# Patient Record
Sex: Female | Born: 1940 | Race: Black or African American | Hispanic: No | Marital: Married | Smoking: Never smoker
Health system: Southern US, Community
[De-identification: ages and names within clinical notes are randomized; demographics above are authoritative.]

## PROBLEM LIST (undated history)

## (undated) DIAGNOSIS — I10 Essential (primary) hypertension: Secondary | ICD-10-CM

## (undated) DIAGNOSIS — C801 Malignant (primary) neoplasm, unspecified: Secondary | ICD-10-CM

## (undated) DIAGNOSIS — R05 Cough: Secondary | ICD-10-CM

## (undated) DIAGNOSIS — E785 Hyperlipidemia, unspecified: Secondary | ICD-10-CM

## (undated) DIAGNOSIS — R0602 Shortness of breath: Secondary | ICD-10-CM

## (undated) DIAGNOSIS — R059 Cough, unspecified: Secondary | ICD-10-CM

## (undated) DIAGNOSIS — M8580 Other specified disorders of bone density and structure, unspecified site: Secondary | ICD-10-CM

## (undated) DIAGNOSIS — T7840XA Allergy, unspecified, initial encounter: Secondary | ICD-10-CM

## (undated) DIAGNOSIS — C50919 Malignant neoplasm of unspecified site of unspecified female breast: Secondary | ICD-10-CM

## (undated) HISTORY — DX: Essential (primary) hypertension: I10

## (undated) HISTORY — DX: Other specified disorders of bone density and structure, unspecified site: M85.80

## (undated) HISTORY — PX: BREAST SURGERY: SHX581

## (undated) HISTORY — PX: OTHER SURGICAL HISTORY: SHX169

## (undated) HISTORY — DX: Cough, unspecified: R05.9

## (undated) HISTORY — DX: Allergy, unspecified, initial encounter: T78.40XA

## (undated) HISTORY — DX: Cough: R05

## (undated) HISTORY — DX: Malignant (primary) neoplasm, unspecified: C80.1

## (undated) HISTORY — DX: Hyperlipidemia, unspecified: E78.5

## (undated) HISTORY — DX: Malignant neoplasm of unspecified site of unspecified female breast: C50.919

---

## 1987-10-28 DIAGNOSIS — C50919 Malignant neoplasm of unspecified site of unspecified female breast: Secondary | ICD-10-CM

## 1987-10-28 HISTORY — PX: MASTECTOMY: SHX3

## 1987-10-28 HISTORY — DX: Malignant neoplasm of unspecified site of unspecified female breast: C50.919

## 1988-07-28 HISTORY — PX: OTHER SURGICAL HISTORY: SHX169

## 1998-04-28 ENCOUNTER — Ambulatory Visit (HOSPITAL_COMMUNITY): Admission: RE | Admit: 1998-04-28 | Discharge: 1998-04-28 | Payer: Self-pay | Admitting: Specialist

## 2000-01-06 ENCOUNTER — Encounter: Payer: Self-pay | Admitting: Hematology and Oncology

## 2000-01-06 ENCOUNTER — Encounter: Admission: RE | Admit: 2000-01-06 | Discharge: 2000-01-06 | Payer: Self-pay | Admitting: Hematology and Oncology

## 2000-02-05 ENCOUNTER — Other Ambulatory Visit: Admission: RE | Admit: 2000-02-05 | Discharge: 2000-02-05 | Payer: Self-pay | Admitting: *Deleted

## 2000-04-22 ENCOUNTER — Encounter: Payer: Self-pay | Admitting: Gastroenterology

## 2000-04-22 ENCOUNTER — Ambulatory Visit (HOSPITAL_COMMUNITY): Admission: RE | Admit: 2000-04-22 | Discharge: 2000-04-22 | Payer: Self-pay | Admitting: Gastroenterology

## 2000-10-27 HISTORY — PX: OTHER SURGICAL HISTORY: SHX169

## 2001-01-06 ENCOUNTER — Encounter (HOSPITAL_BASED_OUTPATIENT_CLINIC_OR_DEPARTMENT_OTHER): Payer: Self-pay | Admitting: General Surgery

## 2001-01-06 ENCOUNTER — Encounter: Admission: RE | Admit: 2001-01-06 | Discharge: 2001-01-06 | Payer: Self-pay | Admitting: General Surgery

## 2001-09-27 LAB — HM PAP SMEAR: HM Pap smear: NEGATIVE

## 2002-01-20 ENCOUNTER — Encounter (HOSPITAL_BASED_OUTPATIENT_CLINIC_OR_DEPARTMENT_OTHER): Payer: Self-pay | Admitting: General Surgery

## 2002-01-20 ENCOUNTER — Encounter: Admission: RE | Admit: 2002-01-20 | Discharge: 2002-01-20 | Payer: Self-pay | Admitting: General Surgery

## 2003-01-23 ENCOUNTER — Encounter (HOSPITAL_BASED_OUTPATIENT_CLINIC_OR_DEPARTMENT_OTHER): Payer: Self-pay | Admitting: General Surgery

## 2003-01-23 ENCOUNTER — Encounter: Admission: RE | Admit: 2003-01-23 | Discharge: 2003-01-23 | Payer: Self-pay | Admitting: General Surgery

## 2004-01-25 ENCOUNTER — Encounter: Admission: RE | Admit: 2004-01-25 | Discharge: 2004-01-25 | Payer: Self-pay | Admitting: General Surgery

## 2004-04-18 ENCOUNTER — Ambulatory Visit (HOSPITAL_COMMUNITY): Admission: RE | Admit: 2004-04-18 | Discharge: 2004-04-18 | Payer: Self-pay | Admitting: General Surgery

## 2004-04-18 ENCOUNTER — Encounter (INDEPENDENT_AMBULATORY_CARE_PROVIDER_SITE_OTHER): Payer: Self-pay | Admitting: Specialist

## 2005-02-10 ENCOUNTER — Encounter: Admission: RE | Admit: 2005-02-10 | Discharge: 2005-02-10 | Payer: Self-pay | Admitting: General Surgery

## 2006-02-13 ENCOUNTER — Encounter: Admission: RE | Admit: 2006-02-13 | Discharge: 2006-02-13 | Payer: Self-pay | Admitting: General Surgery

## 2007-02-23 ENCOUNTER — Encounter: Admission: RE | Admit: 2007-02-23 | Discharge: 2007-02-23 | Payer: Self-pay | Admitting: General Surgery

## 2007-04-20 ENCOUNTER — Ambulatory Visit: Payer: Self-pay | Admitting: Oncology

## 2007-07-07 ENCOUNTER — Ambulatory Visit: Payer: Self-pay | Admitting: Oncology

## 2008-03-02 ENCOUNTER — Encounter: Admission: RE | Admit: 2008-03-02 | Discharge: 2008-03-02 | Payer: Self-pay | Admitting: General Surgery

## 2009-03-13 ENCOUNTER — Encounter: Admission: RE | Admit: 2009-03-13 | Discharge: 2009-03-13 | Payer: Self-pay | Admitting: General Surgery

## 2009-03-19 ENCOUNTER — Encounter: Admission: RE | Admit: 2009-03-19 | Discharge: 2009-03-19 | Payer: Self-pay | Admitting: General Surgery

## 2009-08-05 HISTORY — PX: OTHER SURGICAL HISTORY: SHX169

## 2009-08-27 ENCOUNTER — Encounter: Admission: RE | Admit: 2009-08-27 | Discharge: 2009-08-27 | Payer: Self-pay | Admitting: General Surgery

## 2009-10-03 ENCOUNTER — Ambulatory Visit: Payer: Self-pay | Admitting: Family Medicine

## 2010-01-01 ENCOUNTER — Ambulatory Visit: Payer: Self-pay | Admitting: Family Medicine

## 2010-02-21 ENCOUNTER — Encounter: Admission: RE | Admit: 2010-02-21 | Discharge: 2010-02-21 | Payer: Self-pay | Admitting: Orthopedic Surgery

## 2010-03-15 ENCOUNTER — Encounter: Admission: RE | Admit: 2010-03-15 | Discharge: 2010-03-15 | Payer: Self-pay | Admitting: Obstetrics and Gynecology

## 2010-05-02 ENCOUNTER — Ambulatory Visit: Payer: Self-pay | Admitting: Family Medicine

## 2010-06-13 ENCOUNTER — Ambulatory Visit: Payer: Self-pay | Admitting: Family Medicine

## 2010-08-17 ENCOUNTER — Encounter: Admission: RE | Admit: 2010-08-17 | Discharge: 2010-08-17 | Payer: Self-pay | Admitting: Orthopedic Surgery

## 2010-09-03 ENCOUNTER — Ambulatory Visit: Payer: Self-pay | Admitting: Physician Assistant

## 2010-09-05 ENCOUNTER — Ambulatory Visit: Payer: Self-pay | Admitting: Family Medicine

## 2010-11-17 ENCOUNTER — Encounter: Payer: Self-pay | Admitting: Family Medicine

## 2011-01-31 LAB — HM PAP SMEAR: HM Pap smear: NORMAL

## 2011-02-19 ENCOUNTER — Encounter (INDEPENDENT_AMBULATORY_CARE_PROVIDER_SITE_OTHER): Payer: Self-pay | Admitting: General Surgery

## 2011-02-21 ENCOUNTER — Other Ambulatory Visit: Payer: Self-pay | Admitting: General Surgery

## 2011-02-21 DIAGNOSIS — Z853 Personal history of malignant neoplasm of breast: Secondary | ICD-10-CM

## 2011-03-14 NOTE — Op Note (Signed)
NAME:  NEERA, TENG                            ACCOUNT NO.:  [id number]   MEDICAL RECORD NO.:  [id number]                   PATIENT TYPE:  OIB   LOCATION:  2875                                 FACILITY:  [facility]   PHYSICIAN:  Leonie Man, M.D.                DATE OF BIRTH:  [birth year]   DATE OF PROCEDURE:  04/18/2004  DATE OF DISCHARGE:                                 OPERATIVE REPORT   PREOPERATIVE DIAGNOSIS:  Recurrent lipoma of back.   POSTOPERATIVE DIAGNOSIS:  Recurrent lipoma of back.   OPERATION PERFORMED:  Re-excision of recurrent lipoma of back.   SURGEON:  Leonie Man, M.D.   ASSISTANT:  Nurse.   ANESTHESIA:  General.   INDICATIONS FOR PROCEDURE:  Ms. Zo Loudon is a 70 year old woman who is  status post excision of a large lipoma from back several years ago by Dr.  Aundria Rud.  The lipoma has been growing again over the past several months and  has gotten now to the size approximately 15 x 10 cm in size.  Because of the  patient's previous history of breast cancer, she is somewhat concerned about  it's being a recurrence despite our reassurances to the contrary.  She comes  to the operating room now for excision of this mass.   DESCRIPTION OF PROCEDURE:  Following the induction of satisfactory general  anesthesia, the patient was positioned in the prone position on the  operating room table and the region of the mass was prepped and draped to be  included in the sterile operative field.  The mass is soft and somewhat  mobile lesion overlying the upper thoracic spine extending somewhat  laterally to the left.  I infiltrated the region with 0.5% Marcaine with  epinephrine.  I used the old incision, deepened this through the skin and  subcutaneous tissue carrying it down to the region of the lipoma.  There was  some scarring around the region of the capsule making it somewhat more  difficult to find the planes of the lipoma.  This, however, was accomplished  and the  lipoma was dissected free on all sides, carried down to the fascia  of the trapezius muscles and dissected off in its entirety and removed and  forwarded for pathologic evaluation.  Hemostasis was obtained with  electrocautery.  Sponge, instrument and sharp counts were then verified.  Skin closure was two layers using interrupted 3-0 Vicryl sutures catching a  portion of the trapezius fascia so as to eliminate as much dead space as  possible and the skin was then closed with a running 4-0 Monocryl suture and  then reinforced with Steri-Strips.  Sterile dressings were applied.  Anesthetic was reversed and the patient removed from the operating room to  the recovery room in stable condition.  She tolerated the procedure well.  Leonie Man, M.D.    PB/MEDQ  D:  04/18/2004  T:  04/19/2004  Job:  [id number]

## 2011-03-14 NOTE — Procedures (Signed)
[facility]  Patient:    Sarah Howell, Sarah Howell                           MRN: [id number] Proc. Date: 04/22/00 Attending:  Verlin Grills, M.D.                           Procedure Report  PROCEDURE:  Flexible proctocolonoscopy to the hepatic flexure.  PROCEDURE INDICATION:  Ms. Sarah Howell is a 70 year old female who has undergone a left mastectomy to treat carcinoma of the breast.  Ms. Sarah Howell mother was diagnosed with colon cancer when her mother was in her 60s.  There are no other immediate family members with colon cancer.  Ms. Sarah Howell underwent a normal air contrast barium enema May 02, 1993.  Ms. Sarah Howell viewed our colonoscopy education film and I discussed with her the complications associated with colonoscopy and polypectomy, including a 15-per-thousand risk of bleeding and four-per-thousand risk of intestinal perforation.  Ms. Sarah Howell has signed the operative permit.  CURRENT MEDICATIONS:  Aleve.  PAST MEDICAL HISTORY:  Left mastectomy for breast cancer, lipoma removed from her back, tubal ligation, cesarean section.  MEDICATION ALLERGIES:  None.  ENDOSCOPIST:  Verlin Grills, M.D.  PREMEDICATION:  Demerol 50 mg, Versed 7 mg.  ENDOSCOPE:  Olympus pediatric colonoscope.  DESCRIPTION OF PROCEDURE:  After obtaining informed consent, the patient was placed in the left lateral decubitus position.  I administered intravenous Demerol and intravenous Versed to achieve conscious sedation for the procedure.  The patients blood pressure, oxygen saturation and cardiac rhythm were monitored throughout the procedure and documented in the medical record.  Anal inspection was normal.  Digital rectal exam was normal.  The Olympus pediatric video colonoscope was introduced into the rectum and under direct vision, advanced to the hepatic flexure.  Due to colonic loop formation, I was unable to examine the ascending colon, cecum or ileocecal valve.   Colonic preparation for the exam today was excellent.  Rectum normal.  Sigmoid colon and descending colon normal.  Splenic flexure normal.  Transverse colon normal.  Hepatic flexure normal.  Ascending colon, cecum and ileocecal valve were not examined.  ASSESSMENT:  Normal proctocolonoscopy to the hepatic flexure.  RECOMMENDATIONS:  Air contrast barium enema at [facility] to follow. DD:  04/22/00 TD:  04/23/00 Job: [id number]

## 2011-03-17 ENCOUNTER — Ambulatory Visit
Admission: RE | Admit: 2011-03-17 | Discharge: 2011-03-17 | Disposition: A | Discharge status: Home / Self Care | Payer: Medicare Other | Source: Ambulatory Visit | Attending: General Surgery | Admitting: General Surgery

## 2011-03-17 DIAGNOSIS — Z853 Personal history of malignant neoplasm of breast: Secondary | ICD-10-CM

## 2011-04-14 ENCOUNTER — Other Ambulatory Visit: Payer: Self-pay

## 2011-04-14 MED ORDER — PRAVASTATIN SODIUM 40 MG PO TABS: 40.0000 mg | ORAL_TABLET | Freq: Every day | ORAL | Status: DC

## 2011-04-23 ENCOUNTER — Encounter: Payer: Self-pay | Admitting: Family Medicine

## 2011-05-09 ENCOUNTER — Ambulatory Visit (INDEPENDENT_AMBULATORY_CARE_PROVIDER_SITE_OTHER): Payer: Medicare Other | Admitting: Family Medicine

## 2011-05-09 ENCOUNTER — Encounter: Payer: Self-pay | Admitting: Family Medicine

## 2011-05-09 VITALS — BP 138/80 | HR 72 | Temp 98.6°F | Ht 60.0 in | Wt 201.0 lb

## 2011-05-09 DIAGNOSIS — J40 Bronchitis, not specified as acute or chronic: Secondary | ICD-10-CM

## 2011-05-09 DIAGNOSIS — J329 Chronic sinusitis, unspecified: Secondary | ICD-10-CM

## 2011-05-09 MED ORDER — AMOXICILLIN 875 MG PO TABS: 875.0000 mg | ORAL_TABLET | Freq: Two times a day (BID) | ORAL | Status: AC

## 2011-05-09 NOTE — Progress Notes (Signed)
  Subjective:    Patient ID: KAM KUSHNIR, female    DOB: 1941-04-28, 70 y.o.   MRN: [id number]  HPI Tuesday evening she started having difficulty with chills, nasal congestion and coughing will by fatigue. She also had some vomiting and diarrhea. She has no allergies and does not smoke. It was difficult to get a good history from her.  Review of Systems     Objective:   Physical Exam alert and in no distress. Tympanic membranes and canals are normal. Throat is clear. Tonsils are normal. Neck is supple without adenopathy or thyromegaly. Cardiac exam shows a regular sinus rhythm without murmurs or gallops. Lungs are clear to auscultation. Nasal mucosa shows slightly red and swollen tissue was tenderness over all of her sinuses.       Assessment & Plan:  Sinusitis. Bronchitis I will treat with Amoxil. She is to call if not entirely better.

## 2011-05-09 NOTE — Patient Instructions (Signed)
Take all the antibiotic and if you're not totally back to normal call me

## 2011-05-22 ENCOUNTER — Encounter: Payer: BC Managed Care – PPO | Admitting: Family Medicine

## 2011-05-22 ENCOUNTER — Encounter: Payer: Self-pay | Admitting: Family Medicine

## 2011-05-26 ENCOUNTER — Encounter: Payer: Self-pay | Admitting: Family Medicine

## 2011-05-26 ENCOUNTER — Ambulatory Visit (INDEPENDENT_AMBULATORY_CARE_PROVIDER_SITE_OTHER): Payer: Medicare Other | Admitting: Family Medicine

## 2011-05-26 VITALS — BP 112/80 | HR 80 | Wt 198.0 lb

## 2011-05-26 DIAGNOSIS — E785 Hyperlipidemia, unspecified: Secondary | ICD-10-CM

## 2011-05-26 DIAGNOSIS — J019 Acute sinusitis, unspecified: Secondary | ICD-10-CM

## 2011-05-26 DIAGNOSIS — Z79899 Other long term (current) drug therapy: Secondary | ICD-10-CM

## 2011-05-26 DIAGNOSIS — I10 Essential (primary) hypertension: Secondary | ICD-10-CM

## 2011-05-26 LAB — COMPREHENSIVE METABOLIC PANEL
ALT: 11 U/L (ref 0–35)
AST: 23 U/L (ref 0–37)
Albumin: 4 g/dL (ref 3.5–5.2)
BUN: 14 mg/dL (ref 6–23)
CO2: 23 mEq/L (ref 19–32)
Calcium: 9.8 mg/dL (ref 8.4–10.5)
Potassium: 4.3 mEq/L (ref 3.5–5.3)

## 2011-05-26 LAB — LIPID PANEL
Cholesterol: 184 mg/dL (ref 0–200)
HDL: 47 mg/dL (ref >39)

## 2011-05-26 MED ORDER — AMOXICILLIN 875 MG PO TABS: 875.0000 mg | ORAL_TABLET | Freq: Two times a day (BID) | ORAL | Status: AC

## 2011-05-26 MED ORDER — LOSARTAN POTASSIUM 25 MG PO TABS: 25.0000 mg | ORAL_TABLET | Freq: Every day | ORAL | Status: DC

## 2011-05-26 MED ORDER — PRAVASTATIN SODIUM 40 MG PO TABS: 40.0000 mg | ORAL_TABLET | Freq: Every day | ORAL | Status: DC

## 2011-05-26 NOTE — Progress Notes (Signed)
  Subjective:    Patient ID: Sarah Howell, female    DOB: 05-Mar-1941, 70 y.o.   MRN: [id number]  HPI She is here for recheck. She continues on medications listed in the chart. She presently is on Pravachol 80 mg. She continues to have a slight cough but is roughly 90% better than what her last visit. She needs refills on her medications. She has no other concerns or complaints.   Review of Systems     Objective:   Physical Exam alert and in no distress. Tympanic membranes and canals are normal. Throat is clear. Tonsils are normal. Neck is supple without adenopathy or thyromegaly. Cardiac exam shows a regular sinus rhythm without murmurs or gallops. Lungs are clear to auscultation. Nasal mucosa is normal. Nontender sinuses.        Assessment & Plan:  Hypertension. Hyperlipidemia. Unresolved sinusitis. Her medications were renewed and I will give her another 10 days of Amoxil. She will call and let me know how she is doing.

## 2011-05-26 NOTE — Patient Instructions (Signed)
Take the antibiotic and call me at the end of 10 days if you're still having difficulty.

## 2011-05-30 ENCOUNTER — Ambulatory Visit (INDEPENDENT_AMBULATORY_CARE_PROVIDER_SITE_OTHER): Payer: Medicare Other | Admitting: General Surgery

## 2011-05-30 ENCOUNTER — Encounter (INDEPENDENT_AMBULATORY_CARE_PROVIDER_SITE_OTHER): Payer: Self-pay | Admitting: General Surgery

## 2011-05-30 VITALS — HR 72 | Temp 96.0°F

## 2011-05-30 DIAGNOSIS — C50912 Malignant neoplasm of unspecified site of left female breast: Secondary | ICD-10-CM | POA: Insufficient documentation

## 2011-05-30 DIAGNOSIS — Z853 Personal history of malignant neoplasm of breast: Secondary | ICD-10-CM

## 2011-05-30 NOTE — Progress Notes (Signed)
Sarah Howell is a 70 y.o. female.    Chief Complaint  Patient presents with  . Other    f/u br reck    HPI HPI Pt is doing well.  She has not felt any masses in her R breast or on the left in the reconstructed area.  She has had her colonoscopy up to date.  She denies nipple discharge or skin changes.    Past Medical History  Diagnosis Date  . Cancer     BREAST  . Hypertension   . Alopecia   . Osteopenia   . Hyperlipidemia   . Cough   . Allergy   . Glaucoma     bilateral    Past Surgical History  Procedure Date  . Lt breast ca reconstruction 07/28/1988  . Orthoscopic surgery lt knee 2002  . Orthoscopic surgery rt knee 08/05/2009  . Rotoator cuff cleaning   . Joint replacement   . Breast surgery     lft br mastectomy and reconstruction    Family History  Problem Relation Age of Onset  . Cancer Mother     colon  . Cancer Father     prostate  . Cancer Brother     pancreatic    Social History History  Substance Use Topics  . Smoking status: Former Games developer  . Smokeless tobacco: Never Used  . Alcohol Use: No    No Known Allergies  Current Outpatient Prescriptions  Medication Sig Dispense Refill  . amoxicillin (AMOXIL) 875 MG tablet Take 1 tablet (875 mg total) by mouth 2 (two) times daily.  20 tablet  0  . Brimonidine Tartrate-Timolol (COMBIGAN OP) Apply to eye. 1 drop in each eye in morning        . Calcium Carbonate-Vitamin D (CALCIUM + D PO) Take by mouth 2 (two) times daily.        . finasteride (PROSCAR) 5 MG tablet Take 5 mg by mouth daily. 1/2 tab daily       . losartan (COZAAR) 25 MG tablet Take 1 tablet (25 mg total) by mouth daily.  90 tablet  4  . pravastatin (PRAVACHOL) 40 MG tablet Take 80 mg by mouth daily. @@bedtime          Review of Systems Review of Systems  HENT: Positive for congestion.   All other systems reviewed and are negative.    Physical Exam Physical Exam  Constitutional: She is oriented to person, place, and time. She  appears well-developed and well-nourished. No distress.  HENT:  Head: Normocephalic and atraumatic.  Mouth/Throat: Oropharynx is clear and moist. No oropharyngeal exudate.  Eyes: Conjunctivae are normal. Pupils are equal, round, and reactive to light. No scleral icterus.  Neck: Normal range of motion. Neck supple. No tracheal deviation present. No thyromegaly present.  Cardiovascular: Normal rate, regular rhythm and intact distal pulses.   Respiratory: Effort normal. No respiratory distress. She exhibits no tenderness. Right breast exhibits no inverted nipple, no mass, no nipple discharge, no skin change and no tenderness. Left breast exhibits no inverted nipple, no mass, no nipple discharge, no skin change and no tenderness.       L breast reconstructed, no masses or tenderness R breast with biopsy scar  GI: Soft. She exhibits no distension and no mass. There is no tenderness. There is no rebound and no guarding.  Musculoskeletal: Normal range of motion. She exhibits no edema and no tenderness.  Lymphadenopathy:    She has no cervical adenopathy.  Neurological: She  is alert and oriented to person, place, and time. Coordination normal.  Skin: Skin is warm and dry. No rash noted. She is not diaphoretic. No erythema. No pallor.  Psychiatric: She has a normal mood and affect. Her behavior is normal. Judgment and thought content normal.     Pulse 72, temperature 96 F (35.6 C).  Assessment/Plan History of breast cancer in female, s/p L MRM with reconstruction 1989 w [facility] No abnormal findings of physical exam. R mammogram 02/2011 OK. Follow up in 1 year.      Siriah Treat 05/30/2011, 1:31 PM

## 2011-05-30 NOTE — Assessment & Plan Note (Signed)
No abnormal findings of physical exam. R mammogram 02/2011 OK. Follow up in 1 year.

## 2011-07-29 ENCOUNTER — Telehealth: Payer: Self-pay | Admitting: Family Medicine

## 2011-07-29 MED ORDER — PRAVASTATIN SODIUM 80 MG PO TABS: 80.0000 mg | ORAL_TABLET | Freq: Every evening | ORAL | Status: DC

## 2011-07-29 NOTE — Telephone Encounter (Signed)
Sent in request with 4 refill

## 2011-07-29 NOTE — Telephone Encounter (Signed)
Sent in med

## 2011-10-07 ENCOUNTER — Ambulatory Visit (INDEPENDENT_AMBULATORY_CARE_PROVIDER_SITE_OTHER): Payer: Medicare Other | Admitting: Family Medicine

## 2011-10-07 ENCOUNTER — Encounter: Payer: Self-pay | Admitting: Family Medicine

## 2011-10-07 VITALS — BP 128/80 | HR 73 | Wt 199.0 lb

## 2011-10-07 DIAGNOSIS — Z79899 Other long term (current) drug therapy: Secondary | ICD-10-CM

## 2011-10-07 DIAGNOSIS — M129 Arthropathy, unspecified: Secondary | ICD-10-CM

## 2011-10-07 DIAGNOSIS — J3089 Other allergic rhinitis: Secondary | ICD-10-CM

## 2011-10-07 DIAGNOSIS — M1990 Unspecified osteoarthritis, unspecified site: Secondary | ICD-10-CM

## 2011-10-07 DIAGNOSIS — I10 Essential (primary) hypertension: Secondary | ICD-10-CM

## 2011-10-07 MED ORDER — FEXOFENADINE HCL 180 MG PO TABS: 180.0000 mg | ORAL_TABLET | Freq: Every day | ORAL | Status: DC

## 2011-10-07 NOTE — Patient Instructions (Signed)
Go to [facility] to get your shingles vaccine. Use Tylenol for the knee aches and pains. Continue on Allegra for your allergy symptoms.

## 2011-10-07 NOTE — Progress Notes (Signed)
  Subjective:    Patient ID: Sarah Howell, female    DOB: 1941/02/23, 70 y.o.   MRN: [id number]  HPI She is here for consult. She recently got a ticket for using a handicapped parking area. Her husband has a sticker however it was not in the car. She is interested in getting a sticker however further consultation with her indicates she does not have a the criteria necessary. She does have difficulty with intermittent knee pain however has not taken any medications for this. She also has underlying allergies and continues on her blood pressure medications. He would like a refill on her blood pressure medicines and an antihistamine for her allergies.   Review of Systems     Objective:   Physical Exam Alert and in no distress otherwise not examined.       Assessment & Plan:   1. Allergic rhinitis due to other allergen   2. Hypertension   3. Arthritis   4. Encounter for long-term (current) use of other medications    I will give her Allegra. Recommend Tylenol for her arthritis symptoms. Continue on her blood pressure meds. I also wrote her prescription to get shingles vaccine.

## 2011-10-28 HISTORY — PX: EYE SURGERY: SHX253

## 2011-10-31 ENCOUNTER — Ambulatory Visit (INDEPENDENT_AMBULATORY_CARE_PROVIDER_SITE_OTHER): Payer: Medicare Other | Admitting: Family Medicine

## 2011-10-31 ENCOUNTER — Encounter: Payer: Self-pay | Admitting: Family Medicine

## 2011-10-31 VITALS — BP 128/88 | HR 64 | Ht 59.0 in | Wt 198.0 lb

## 2011-10-31 DIAGNOSIS — K219 Gastro-esophageal reflux disease without esophagitis: Secondary | ICD-10-CM

## 2011-10-31 NOTE — Patient Instructions (Signed)
Take 2 Prilosec per day and call me in one week

## 2011-10-31 NOTE — Progress Notes (Signed)
  Subjective:    Patient ID: Sarah Howell, female    DOB: 07-23-1941, 71 y.o.   MRN: [id number]  HPI She complains of a five-day history of coughing with some postnasal drainage. She did have one episode of vomiting. She notes that she coughs with eating and sometimes when she lies down. She continues on Allegra for her allergy-type symptoms. She continues on medications listed in the chart.   Review of Systems     Objective:   Physical Exam alert and in no distress. Tympanic membranes and canals are normal. Throat is clear. Tonsils are normal. Neck is supple without adenopathy or thyromegaly. Cardiac exam shows a regular sinus rhythm without murmurs or gallops. Lungs are clear to auscultation.        Assessment & Plan:  Possible GERD. Recommend she try Prilosec 2 pills per day and call me in one week.

## 2011-11-18 ENCOUNTER — Ambulatory Visit (INDEPENDENT_AMBULATORY_CARE_PROVIDER_SITE_OTHER): Payer: Medicare Other | Admitting: Family Medicine

## 2011-11-18 VITALS — BP 128/80 | HR 80 | Ht 59.0 in | Wt 199.0 lb

## 2011-11-18 DIAGNOSIS — I10 Essential (primary) hypertension: Secondary | ICD-10-CM

## 2011-11-18 DIAGNOSIS — H269 Unspecified cataract: Secondary | ICD-10-CM

## 2011-11-18 DIAGNOSIS — L659 Nonscarring hair loss, unspecified: Secondary | ICD-10-CM

## 2011-11-18 NOTE — Progress Notes (Signed)
  Subjective:    Patient ID: Sarah Howell, female    DOB: [birth year], 71 y.o.   MRN: [id number]  HPI She is here for a preoperative evaluation prior to cataract surgery. She has had no chest pain, shortness of breath, pulmonary symptoms. She does have underlying hypertension as well as alopecia. She sees a Armed forces operational officer in [place] for her alopecia and presently is on finasteride. She also is on Cozaar for her hypertension and use of Pravachol for her cholesterol.   Review of Systems     Objective:   Physical Exam alert and in no distress. Tympanic membranes and canals are normal. Throat is clear. Tonsils are normal. Neck is supple without adenopathy or thyromegaly. Cardiac exam shows a regular sinus rhythm without murmurs or gallops. Lungs are clear to auscultation. EKG shows no acute changes.       Assessment & Plan:   1. Cataracts, bilateral   2. Alopecia   3. Hypertension    She is cleared for surgery.

## 2011-11-19 ENCOUNTER — Encounter: Payer: Self-pay | Admitting: Family Medicine

## 2011-12-01 ENCOUNTER — Encounter (HOSPITAL_COMMUNITY): Payer: Self-pay | Admitting: Pharmacy Technician

## 2011-12-03 ENCOUNTER — Other Ambulatory Visit: Payer: Self-pay | Admitting: Family Medicine

## 2011-12-03 ENCOUNTER — Other Ambulatory Visit: Payer: Self-pay | Admitting: Ophthalmology

## 2011-12-03 NOTE — H&P (Signed)
Chief Complaint:  POAG Patient,Blurry vision worse od interfering with daily activity.    Discontinued Combigan and is now using Cosopt PF (much better)   Subjective:  HISTORY OF PRESENT ILLNESS:  Onset: Years   Duration:  Years   Quality: Same     Location:  Both   Intensity / Severity: Moderate   Context / When:  Been Treated For Before    Mental Status: Oriented to Time, Person, Place   REVIEW OF SYSTEMS:   GEN: Denies weight changes, appetite changes, unusual weakness, bleeding, fever, chills, recent trauma or infections  HEENT: Denies vision changes, hearing changes, epistaxis, unusual sneezing, sore throat, swallowing difficulties, ear pain, or facial pain.  NECK: Denies neck pain swelling or stiffness.  LUNGS: Denies cough, dyspnea, orthopnea, or hemoptosis.  HEART: Confirms HIGH BLOOD PRESSURE.  ABD: Denies abdomen pain, eructation, nausea, vomiting, hematemesis, diarrhea, constipation, hematochezia, melena, acholic stools, or flatulence.  GENT: Denies recent dysruia, urine frequency, urine hesitancy, urine urgency, urine flow-slow, urine retention, nocturia, polyuria, dark urine, or incontinence.  BJE: Denies arthralgia, joint stiffness, back pain, muscle cramps, or myalgia.  SKIN: Denies rashes, lesions, anhidrosis, bruising, or pruritus.  NEURO: Denies memory loss, disorientation, syncope, diplopia, dizziness, vertigo, clumsiness, paresthesias or cephalgia.    OBJECTIVE:  EYE EXAM:   VA:  OD Excel 20/40 Last visit 20/50 OS Fisher 20/40 (last visit 20/50  OU [place] 20/25  Glasses: OD XOTC Readers  AutoRefraction: OD: +1.00 + 0.75 x 030 OS: +1.00 + 0.25 x 089  Ks: OD: 44.50  45.50 OS: 44.50  46.50  Refraction 12.14.2012 OD: +1.25 + 0.75 x 020.....20/40 OS: +1.00 + 0.50 x 090.....20/30 Add: 2.50   CCT<  VF:  OD computerized visual field test normal OS slightly worse  Ocular Adnexa: NORMAL WITH NO LYMPHADENOPHTHY  Motility: ortho with  full versions  SLE Conjunctiva: pinguculae plus one to two  hyperemia OU  + 1 injection OU Pupils: 4 mm reactive  Iris: Brown   Cornea: arcus plus two to three staining OU  Anterior Chamber: deep and quiet  Lens: plus two- three nuclear sclerosis od/os plus two nuclear sclersis  Vitreous:  TARGET Ta<  IOP: 11 and 11 mm of mercury right and left eye    Time:  11/13/2011 11:22   Gonio:  Dilation:Phenylephrine 2.5%,Tropicamide  Optic discs: OD 85% CUP inferior rim flat & discolored                   OS 85% cup temporal pallor  Retina & Vessels:   HEALTH AND PHYSICAL  Blood Pressure:140/80  Pulse:75  Respiration:20  GENERAL: No acute distress  HEENT: MM's WNL, TM's normal, PERLA, Conjunctiva WNL, Fundi benign  NECK: Full ROM, no adenopathy or masses, Thyroid WNL  LUNGS: Clear to auscultation, equal BS  HEART: RR&R, no murmur, gallop, or rub, not enlarged  ABD: Soft no tenderness, BS WHNL, No Masses or Organomegaly  GENT: deferred  BJE:  NEURO: CN II through XII intact  SKIN:  STUDIES: A-Scan  ASSESSMENT:  Primary open angle glaucoma   ICD#365.11   optic nerve no change from 2007 photos detected  good IOP control  Keratoconjunctivitis sicca, 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 continue same meds    MEDICATION:  Artificial Tears  Pataday: 0.2% (solution) SIG-  1 gtt in each affected eye once a day for 10 days #1 Bottle(s)  Substitutions Allowed Refills- 3 Notes-    Cosopt PF: 2%-0.5% (solution) SIG-  1 gtt in each eye 2 times a day for 30 days #30 Vial(s)  Substitutions Not Allowed Refills- 5 Notes-    Bromday: 0.09% (solution) SIG-  one drop right eye once a day  #1 Bottle(s)  Substitutions Not Allowed Refills- 3 Notes-    Besivance: 0.6%  (suspension) SIG-  one drop right eye twice a day  #1 Bottle(s)  Substitutions Not Allowed Refills- 2 Notes-    Pred Forte: acetate 1% (suspension) SIG-  0ne drop right eye four times a day  #1 Bottle(s)  Substitutions Allowed Refills- 1 Notes   FOLLOW UP: One Day PostOp

## 2011-12-05 ENCOUNTER — Encounter (HOSPITAL_COMMUNITY): Payer: Self-pay

## 2011-12-05 ENCOUNTER — Encounter (HOSPITAL_COMMUNITY)
Admission: RE | Admit: 2011-12-05 | Discharge: 2011-12-05 | Disposition: A | Discharge status: Home / Self Care | Payer: Medicare Other | Source: Ambulatory Visit | Attending: Ophthalmology | Admitting: Ophthalmology

## 2011-12-05 ENCOUNTER — Encounter (HOSPITAL_COMMUNITY)
Admission: RE | Admit: 2011-12-05 | Discharge: 2011-12-05 | Disposition: A | Discharge status: Home / Self Care | Payer: Medicare Other | Source: Ambulatory Visit | Attending: Anesthesiology | Admitting: Anesthesiology

## 2011-12-05 HISTORY — DX: Shortness of breath: R06.02

## 2011-12-05 LAB — CBC
HCT: 39.6 % (ref 36.0–46.0)
Hemoglobin: 12.8 g/dL (ref 12.0–15.0)
MCV: 92.7 fL (ref 78.0–100.0)
Platelets: 218 10*3/uL (ref 150–400)
RBC: 4.27 MIL/uL (ref 3.87–5.11)
WBC: 8 10*3/uL (ref 4.0–10.5)

## 2011-12-05 LAB — BASIC METABOLIC PANEL
CO2: 26 mEq/L (ref 19–32)
Chloride: 104 mEq/L (ref 96–112)
Glucose, Bld: 89 mg/dL (ref 70–99)
Potassium: 4.1 mEq/L (ref 3.5–5.1)
Sodium: 140 mEq/L (ref 135–145)

## 2011-12-05 NOTE — Progress Notes (Signed)
MULTIPLE CALLS MADE TO DR [facility] OFFICE TO CLARIFY CONSENT, NO RETURN CALL. MESSAGE LEFT ON VOICEMAIL.  WILL NEED TO SIGN CONSENT DOS.

## 2011-12-05 NOTE — Pre-Procedure Instructions (Signed)
20 PHILENA OBEY  12/05/2011   Your procedure is scheduled on: Wednesday 12/10/11   Report to [facility] at 1030 AM.  Call this number if you have problems the morning of surgery: [phone]   Remember:   Do not eat food:After Midnight.  May have clear liquids: up to 4 Hours before arrival.  Clear liquids include soda, tea, black coffee, apple or grape juice, broth.  Take these medicines the morning of surgery with A SIP OF WATER:     Do not wear jewelry, make-up or nail polish.  Do not wear lotions, powders, or perfumes. You may wear deodorant.  Do not shave 48 hours prior to surgery.  Do not bring valuables to the hospital.  Contacts, dentures or bridgework may not be worn into surgery.  Leave suitcase in the car. After surgery it may be brought to your room.  For patients admitted to the hospital, checkout time is 11:00 AM the day of discharge.   Patients discharged the day of surgery will not be allowed to drive home.  Name and phone number of your driver:   Special Instructions: CHG Shower Use Special Wash: 1/2 bottle night before surgery and 1/2 bottle morning of surgery.   Please read over the following fact sheets that you were given: Pain Booklet, MRSA Information and Surgical Site Infection Prevention

## 2011-12-09 MED ORDER — KETOROLAC TROMETHAMINE 0.5 % OP SOLN
1.0000 [drp] | OPHTHALMIC | Status: AC
  Administered 2011-12-10 (×3): 1 [drp] via OPHTHALMIC
  Filled 2011-12-09: qty 5

## 2011-12-09 MED ORDER — CYCLOPENTOLATE-PHENYLEPHRINE 0.2-1 % OP SOLN
1.0000 [drp] | OPHTHALMIC | Status: AC
  Administered 2011-12-10 (×3): 1 [drp] via OPHTHALMIC
  Filled 2011-12-09: qty 2

## 2011-12-10 ENCOUNTER — Encounter (HOSPITAL_COMMUNITY): Payer: Self-pay | Admitting: *Deleted

## 2011-12-10 ENCOUNTER — Encounter (HOSPITAL_COMMUNITY)
Admission: RE | Disposition: A | Discharge status: Home / Self Care | Payer: Self-pay | Source: Ambulatory Visit | Attending: Ophthalmology

## 2011-12-10 ENCOUNTER — Ambulatory Visit (HOSPITAL_COMMUNITY): Payer: Medicare Other | Admitting: *Deleted

## 2011-12-10 ENCOUNTER — Ambulatory Visit (HOSPITAL_COMMUNITY)
Admission: RE | Admit: 2011-12-10 | Discharge: 2011-12-10 | Disposition: A | Discharge status: Home / Self Care | Payer: Medicare Other | Source: Ambulatory Visit | Attending: Ophthalmology | Admitting: Ophthalmology

## 2011-12-10 DIAGNOSIS — Z01812 Encounter for preprocedural laboratory examination: Secondary | ICD-10-CM | POA: Insufficient documentation

## 2011-12-10 DIAGNOSIS — Z853 Personal history of malignant neoplasm of breast: Secondary | ICD-10-CM

## 2011-12-10 DIAGNOSIS — H2510 Age-related nuclear cataract, unspecified eye: Secondary | ICD-10-CM

## 2011-12-10 DIAGNOSIS — E785 Hyperlipidemia, unspecified: Secondary | ICD-10-CM

## 2011-12-10 DIAGNOSIS — H269 Unspecified cataract: Secondary | ICD-10-CM | POA: Insufficient documentation

## 2011-12-10 DIAGNOSIS — H409 Unspecified glaucoma: Secondary | ICD-10-CM | POA: Insufficient documentation

## 2011-12-10 DIAGNOSIS — H4010X0 Unspecified open-angle glaucoma, stage unspecified: Secondary | ICD-10-CM | POA: Insufficient documentation

## 2011-12-10 DIAGNOSIS — I10 Essential (primary) hypertension: Secondary | ICD-10-CM

## 2011-12-10 DIAGNOSIS — Z01818 Encounter for other preprocedural examination: Secondary | ICD-10-CM | POA: Insufficient documentation

## 2011-12-10 HISTORY — PX: CATARACT EXTRACTION W/PHACO: SHX586

## 2011-12-10 SURGERY — PHACOEMULSIFICATION, CATARACT, WITH IOL INSERTION
Anesthesia: Monitor Anesthesia Care | Site: Eye | Laterality: Right | Wound class: Clean

## 2011-12-10 MED ORDER — SODIUM HYALURONATE 10 MG/ML IO SOLN
INTRAOCULAR | Status: DC | PRN
  Administered 2011-12-10: 0.85 mL via INTRAOCULAR

## 2011-12-10 MED ORDER — LIDOCAINE-EPINEPHRINE 2 %-1:100000 IJ SOLN
INTRAMUSCULAR | Status: DC | PRN
  Administered 2011-12-10: 10 mL

## 2011-12-10 MED ORDER — BSS IO SOLN
INTRAOCULAR | Status: DC | PRN
  Administered 2011-12-10: 500 mL via INTRAOCULAR

## 2011-12-10 MED ORDER — BUPIVACAINE HCL 0.75 % IJ SOLN
INTRAMUSCULAR | Status: DC | PRN
  Administered 2011-12-10: 10 mL

## 2011-12-10 MED ORDER — EPINEPHRINE HCL 1 MG/ML IJ SOLN
INTRAMUSCULAR | Status: DC | PRN
  Administered 2011-12-10: .3 mL

## 2011-12-10 MED ORDER — BSS IO SOLN
INTRAOCULAR | Status: DC | PRN
  Administered 2011-12-10: 15 mL via INTRAOCULAR

## 2011-12-10 MED ORDER — PROPOFOL 10 MG/ML IV BOLUS
INTRAVENOUS | Status: DC | PRN
  Administered 2011-12-10: 30 mg via INTRAVENOUS

## 2011-12-10 MED ORDER — HYALURONIDASE HUMAN 150 UNIT/ML IJ SOLN
INTRAMUSCULAR | Status: DC | PRN
  Administered 2011-12-10: 150 [IU]

## 2011-12-10 MED ORDER — TOBRAMYCIN 0.3 % OP OINT
TOPICAL_OINTMENT | OPHTHALMIC | Status: DC | PRN
  Administered 2011-12-10: 1 via OPHTHALMIC

## 2011-12-10 MED ORDER — NA CHONDROIT SULF-NA HYALURON 40-30 MG/ML IO SOLN
INTRAOCULAR | Status: DC | PRN
  Administered 2011-12-10: 0.5 mL via INTRAOCULAR

## 2011-12-10 MED ORDER — ACETYLCHOLINE CHLORIDE 1:100 IO SOLR
INTRAOCULAR | Status: DC | PRN
  Administered 2011-12-10: 10 mg via INTRAOCULAR

## 2011-12-10 MED ORDER — SODIUM CHLORIDE 0.9 % IV SOLN
INTRAVENOUS | Status: DC | PRN
  Administered 2011-12-10: 13:00:00 via INTRAVENOUS

## 2011-12-10 SURGICAL SUPPLY — 39 items
APL SRG 3 HI ABS STRL LF PLS (MISCELLANEOUS) ×1
APPLICATOR COTTON TIP 6IN STRL (MISCELLANEOUS) ×2 IMPLANT
APPLICATOR DR MATTHEWS STRL (MISCELLANEOUS) ×2 IMPLANT
BLADE KERATOME 2.75 (BLADE) ×2 IMPLANT
BLADE MINI RND TIP GREEN BEAV (BLADE) IMPLANT
BLADE STAB KNIFE 45DEG (BLADE) IMPLANT
CANNULA ANTERIOR CHAMBER 27GA (MISCELLANEOUS) ×2 IMPLANT
CLOTH BEACON ORANGE TIMEOUT ST (SAFETY) ×2 IMPLANT
CORDS BIPOLAR (ELECTRODE) IMPLANT
DRAPE OPHTHALMIC 40X48 W POUCH (DRAPES) ×2 IMPLANT
DRAPE RETRACTOR (MISCELLANEOUS) ×2 IMPLANT
FILTER BLUE MILLIPORE (MISCELLANEOUS) IMPLANT
GLOVE BIO SURGEON STRL SZ8 (GLOVE) ×2 IMPLANT
GLOVE SURG SS PI 6.5 STRL IVOR (GLOVE) ×3 IMPLANT
GOWN STRL NON-REIN LRG LVL3 (GOWN DISPOSABLE) ×5 IMPLANT
KIT ROOM TURNOVER OR (KITS) ×2 IMPLANT
KNIFE CRESCENT 2.5 55 ANG (BLADE) IMPLANT
LENS IOL ACRSF IQ PC 20.0 (Intraocular Lens) IMPLANT
LENS IOL ACRYSOF IQ POST 20.0 (Intraocular Lens) ×2 IMPLANT
MARKER SKIN DUAL TIP RULER LAB (MISCELLANEOUS) ×2 IMPLANT
MASK EYE SHIELD (GAUZE/BANDAGES/DRESSINGS) ×1 IMPLANT
NDL 25GX 5/8IN NON SAFETY (NEEDLE) ×1 IMPLANT
NEEDLE 25GX 5/8IN NON SAFETY (NEEDLE) ×2 IMPLANT
NS IRRIG 1000ML POUR BTL (IV SOLUTION) ×2 IMPLANT
PACK CATARACT CUSTOM (CUSTOM PROCEDURE TRAY) ×2 IMPLANT
PACK CATARACT MCHSCP (PACKS) ×2 IMPLANT
PAD ARMBOARD 7.5X6 YLW CONV (MISCELLANEOUS) ×4 IMPLANT
PAD EYE OVAL STERILE LF (GAUZE/BANDAGES/DRESSINGS) ×1 IMPLANT
PROBE ANTERIOR 20G W/INFUS NDL (MISCELLANEOUS) IMPLANT
SPEAR EYE SURG WECK-CEL (MISCELLANEOUS) IMPLANT
SUT ETHILON 10 0 CS140 6 (SUTURE) ×2 IMPLANT
SUT SILK 4 0 C 3 735G (SUTURE) IMPLANT
SUT SILK 6 0 G 6 (SUTURE) IMPLANT
SUT VICRYL 8 0 TG140 8 (SUTURE) IMPLANT
SYR 3ML LL SCALE MARK (SYRINGE) IMPLANT
TAPE SURG TRANSPORE 1 IN (GAUZE/BANDAGES/DRESSINGS) IMPLANT
TAPE SURGICAL TRANSPORE 1 IN (GAUZE/BANDAGES/DRESSINGS) ×1
TOWEL OR 17X24 6PK STRL BLUE (TOWEL DISPOSABLE) ×4 IMPLANT
WATER STERILE IRR 1000ML POUR (IV SOLUTION) ×2 IMPLANT

## 2011-12-10 NOTE — Progress Notes (Signed)
Pt had iv in right wrist placed by holding rn ... Removed in  Post op ... Tip intact. #20 quick cath ... Site unremarkable.Marland KitchenMarland Kitchen

## 2011-12-10 NOTE — Op Note (Signed)
Preop diagnosis: This significant cataract right eye and controlled the primary open-angle glaucoma Postoperative diagnosis: Same Procedure: Phacoemulsification with intraocular lens implant right eye Anesthesia: 2% Xylocaine with epinephrine at the 50 mixture 0.75% Marcaine with ampule of [place] Surgeon: Ardelle Anton Junior M.D. Procedure: The patient was taken to the operating room where under monitored anesthesia she was given a peribulbar block with the aforementioned local anesthetic agent. Following this the patient's face was prepped and draped in the usual sterile fashion after pressure had been applied to the globe. While positioning the operating microscope it was noted that the scope was not functioning properly the foot pedal did not work foot pedal was removed replaced and still did not work properly at this point it was necessary to remove this microscope bringing another microscope microscope was evaluated and noted to work appropriately the microscope was situated and with the patient's face prepped and draped in the surgeon sitting temporally a Weck-Cel sponge was used to fixate the eye and a 15 blade was used to ensue superior clear cornea. Viscoat was injected in the eye and additional Weck-Cel sponge was used to fixate the eye and a 2.75 mm keratome blade was used in a stepwise fashion to the temporal clear cornea Viscoat was again injected and a bent 25-gauge needle was used to incise the anterior capsule and a continuous tear curvilinear capsulorrhexis was formed. BSS was used to hydrodissect and hydrodelineate the nucleus with a ring noted around the epinucleus following this the phacoemulsification unit was then used to remove the epinucleus and sculpt the nucleus the nucleus was snapped with the Kuglen hook and phaco tip into 4 quadrants and all quadrants were removed a Binkhorst cannula was then used to hydrodissect the epinucleus. The irrigation aspiration device was then used to  remove the epinucleus and cortical fibers from the eye the posterior capsule remained intact therefore Provisc was injected into the eye. The intraocular lens implant which was an Alcon AcrySof SN 60 WF was noted to have no defects power 20.0 diopter lens SN #[id number] was placed the lens injector was injected in the eye and unfolded into position it was positioned with the Kuglen hook. On this diet was used to remove viscoelastic from the eye Miostat was injected the eye was pressurized and a single 10-0 nylon suture was placed the eye was pressurized again and there being no leakage of instrumentation were removed from the eye. Topical TobraDex ointment was applied to the eye a patch and Fox shield were placed and the patient returned to the recovery area in stable condition Complications none Landscape architect M.D.

## 2011-12-10 NOTE — Preoperative (Signed)
Beta Blockers   Reason not to administer Beta Blockers:Not Applicable 

## 2011-12-10 NOTE — Transfer of Care (Signed)
Immediate Anesthesia Transfer of Care Note  Patient: Sarah Howell  Procedure(s) Performed: Procedure(s) (LRB): CATARACT EXTRACTION PHACO AND INTRAOCULAR LENS PLACEMENT (IOC) (Right)  Patient Location: PACU  Anesthesia Type: MAC  Level of Consciousness: awake, alert  and oriented  Airway & Oxygen Therapy: Patient Spontanous Breathing  Post-op Assessment: Report given to PACU RN and Post -op Vital signs reviewed and stable  Post vital signs: Reviewed and stable  Complications: No apparent anesthesia complications

## 2011-12-10 NOTE — H&P (View-Only) (Signed)
 Chief Complaint:  POAG Patient,Blurry vision worse od interfering with daily activity.    Discontinued Combigan and is now using Cosopt PF (much better)   Subjective:  HISTORY OF PRESENT ILLNESS:  Onset: Years   Duration:  Years   Quality: Same     Location:  Both   Intensity / Severity: Moderate   Context / When:  Been Treated For Before    Mental Status: Oriented to Time, Person, Place   REVIEW OF SYSTEMS:   GEN: Denies weight changes, appetite changes, unusual weakness, bleeding, fever, chills, recent trauma or infections  HEENT: Denies vision changes, hearing changes, epistaxis, unusual sneezing, sore throat, swallowing difficulties, ear pain, or facial pain.  NECK: Denies neck pain swelling or stiffness.  LUNGS: Denies cough, dyspnea, orthopnea, or hemoptosis.  HEART: Confirms HIGH BLOOD PRESSURE.  ABD: Denies abdomen pain, eructation, nausea, vomiting, hematemesis, diarrhea, constipation, hematochezia, melena, acholic stools, or flatulence.  GENT: Denies recent dysruia, urine frequency, urine hesitancy, urine urgency, urine flow-slow, urine retention, nocturia, polyuria, dark urine, or incontinence.  BJE: Denies arthralgia, joint stiffness, back pain, muscle cramps, or myalgia.  SKIN: Denies rashes, lesions, anhidrosis, bruising, or pruritus.  NEURO: Denies memory loss, disorientation, syncope, diplopia, dizziness, vertigo, clumsiness, paresthesias or cephalgia.    OBJECTIVE:  EYE EXAM:   VA:  OD Sarah Howell 20/40 Last visit 20/50 OS Sarah Howell 20/40 (last visit 20/50  OU Sarah Howell 20/25  Glasses: OD XOTC Readers  AutoRefraction: OD: +1.00 + 0.75 x 030 OS: +1.00 + 0.25 x 089  Ks: OD: 44.50  45.50 OS: 44.50  46.50  Refraction 12.14.2012 OD: +1.25 + 0.75 x 020.....20/40 OS: +1.00 + 0.50 x 090.....20/30 Add: 2.50   CCT<  VF:  OD computerized visual field test normal OS slightly worse  Ocular Adnexa: NORMAL WITH NO LYMPHADENOPHTHY  Motility: ortho with  full versions  SLE Conjunctiva: pinguculae plus one to two  hyperemia OU  + 1 injection OU Pupils: 4 mm reactive  Iris: Brown   Cornea: arcus plus two to three staining OU  Anterior Chamber: deep and quiet  Lens: plus two- three nuclear sclerosis od/os plus two nuclear sclersis  Vitreous:  TARGET Ta<  IOP: 11 and 11 mm of mercury right and left eye    Time:  11/13/2011 11:22   Gonio:  Dilation:Phenylephrine 2.5%,Tropicamide  Optic discs: OD 85% CUP inferior rim flat & discolored                   OS 85% cup temporal pallor  Retina & Vessels:   HEALTH AND PHYSICAL  Blood Pressure:140/80  Pulse:75  Respiration:20  GENERAL: No acute distress  HEENT: MM's WNL, TM's normal, PERLA, Conjunctiva WNL, Fundi benign  NECK: Full ROM, no adenopathy or masses, Thyroid WNL  LUNGS: Clear to auscultation, equal BS  HEART: RR&R, no murmur, gallop, or rub, not enlarged  ABD: Soft no tenderness, BS WHNL, No Masses or Organomegaly  GENT: deferred  BJE:  NEURO: CN II through XII intact  SKIN:  STUDIES: A-Scan  ASSESSMENT:  Primary open angle glaucoma   ICD#365.11   optic nerve no change from 2007 photos detected  good IOP control  Keratoconjunctivitis sicca, 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 continue same meds    MEDICATION:  Artificial Tears   Pataday: 0.2% (solution) SIG-  1 gtt in each affected eye once a day for 10 days #1 Bottle(s)  Substitutions Allowed Refills- 3 Notes-    Cosopt PF: 2%-0.5% (solution) SIG-  1 gtt in each eye 2 times a day for 30 days #30 Vial(s)  Substitutions Not Allowed Refills- 5 Notes-    Bromday: 0.09% (solution) SIG-  one drop right eye once a day  #1 Bottle(s)  Substitutions Not Allowed Refills- 3 Notes-    Besivance: 0.6%  (suspension) SIG-  one drop right eye twice a day  #1 Bottle(s)  Substitutions Not Allowed Refills- 2 Notes-    Pred Forte: acetate 1% (suspension) SIG-  0ne drop right eye four times a day  #1 Bottle(s)  Substitutions Allowed Refills- 1 Notes   FOLLOW UP: One Day PostOp 

## 2011-12-10 NOTE — Anesthesia Preprocedure Evaluation (Addendum)
Anesthesia Evaluation  Patient identified by MRN, date of birth, ID band Patient awake    Reviewed: Allergy & Precautions, H&P , NPO status , Patient's Chart, lab work & pertinent test results, reviewed documented beta blocker date and time   History of Anesthesia Complications (+) AWARENESS UNDER ANESTHESIA  Airway Mallampati: II TM Distance: >3 FB Neck ROM: Full    Dental  (+) Teeth Intact   Pulmonary shortness of breath,          Cardiovascular hypertension, Pt. on medications regular Normal    Neuro/Psych    GI/Hepatic   Endo/Other    Renal/GU      Musculoskeletal   Abdominal   Peds  Hematology   Anesthesia Other Findings   Reproductive/Obstetrics                         Anesthesia Physical Anesthesia Plan  ASA: II  Anesthesia Plan: MAC   Post-op Pain Management:    Induction:   Airway Management Planned: Mask  Additional Equipment:   Intra-op Plan:   Post-operative Plan:   Informed Consent: I have reviewed the patients History and Physical, chart, labs and discussed the procedure including the risks, benefits and alternatives for the proposed anesthesia with the patient or authorized representative who has indicated his/her understanding and acceptance.   Dental advisory given  Plan Discussed with: Anesthesiologist, Surgeon and CRNA  Anesthesia Plan Comments:        Anesthesia Quick Evaluation

## 2011-12-10 NOTE — Interval H&P Note (Signed)
History and Physical Interval Note:  12/10/2011 12:54 PM  Sarah Howell  has presented today for surgery, with the diagnosis of Cataract OD  The various methods of treatment have been discussed with the patient and family. After consideration of risks, benefits and other options for treatment, the patient has consented to  Procedure(s) (LRB): CATARACT EXTRACTION PHACO AND INTRAOCULAR LENS PLACEMENT (IOC) (Right eye) as a surgical intervention .  The patients' history has been reviewed, patient examined, no change in status, stable for surgery.  I have reviewed the patients' chart and labs.  Questions were answered to the patient's satisfaction.     Shiva Sahagian

## 2011-12-10 NOTE — Discharge Instructions (Signed)
The patient should sleep on her back or left side she can remove the eye patch at 5:00 this afternoon

## 2011-12-11 ENCOUNTER — Encounter (HOSPITAL_COMMUNITY): Payer: Self-pay | Admitting: Ophthalmology

## 2012-01-13 ENCOUNTER — Ambulatory Visit (INDEPENDENT_AMBULATORY_CARE_PROVIDER_SITE_OTHER): Payer: Medicare Other | Admitting: Family Medicine

## 2012-01-13 DIAGNOSIS — H269 Unspecified cataract: Secondary | ICD-10-CM

## 2012-01-13 NOTE — Progress Notes (Signed)
  Subjective:    Patient ID: Sarah Howell, female    DOB: [birth year], 71 y.o.   MRN: [id number]  HPI She is here for preoperative evaluation. She recently had one cataract removed and is scheduled for another one. She had no difficulty with the surgery. She's had no chest pain, shortness of breath. She continues on medications listed in the chart.   Review of Systems     Objective:   Physical Exam alert and in no distress. Tympanic membranes and canals are normal. Throat is clear. Tonsils are normal. Neck is supple without adenopathy or thyromegaly. Cardiac exam shows a regular sinus rhythm without murmurs or gallops. Lungs are clear to auscultation.        Assessment & Plan:  Cataract. Cleared for surgery.

## 2012-01-20 ENCOUNTER — Other Ambulatory Visit: Payer: Self-pay | Admitting: Ophthalmology

## 2012-01-20 ENCOUNTER — Encounter (HOSPITAL_COMMUNITY): Payer: Self-pay | Admitting: Pharmacy Technician

## 2012-01-20 NOTE — H&P (Signed)
H I S T O R Y   A N D   P H Y S I C A L  Patient's Name Sarah Howell Date   01/20/2012  Patient's Age 71 Time 12:33  Medical Record # [id number] Doctor Chalmers Guest   SUBJECTIVE: Referred By:    Chief Complaint:  patient notes blurry vision OS interfering  with with daily activities  Patient had phacoemulsification with intraocular lens implant OD 02.13.2013 and Scheduled for phacoemulsification with intraocular lens implant  OS 04.10.2013    HISTORY OF PRESENT ILLNESS:  Onset:  6 weeks  Duration:  6 weeks  Quality:   improved  Location:  right  Intensity / Severity:  moderate  Context / When:   just happened  Mental Status: Oriented to time and place  REVIEW OF SYSTEMS   GEN: Denies weight changes, appetite changes, unusual weakness, bleeding, fever, chills, recent trauma or infections  HEENT: Denies vision changes, hearing changes, epistaxis, unusual sneezing, sore throat, swallowing difficulties, ear pain, or facial pain.  NECK: Denies neck pain swelling or stiffness.  LUNGS: Denies cough, dyspnea, orthopnea, or hemoptosis.  HEART: Denies palpitations or chest pain. Confirms HIGH BLOOD PRESSURE.  ABD: Denies abdomen pain, eructation, nausea, vomiting, hematemesis, diarrhea, constipation, hematochezia, melena, acholic stools, or flatulence.  GENT: Denies recent dysruia, urine frequency, urine hesitancy, urine urgency, urine flow-slow, urine retention, nocturia, polyuria, dark urine, or incontinence.  BJE: Denies arthralgia, joint stiffness, back pain, muscle cramps, or myalgia.  SKIN: Denies rashes, lesions, anhidrosis, bruising, or pruritus.  NEURO: Denies memory loss, disorientation, syncope, diplopia, dizziness, vertigo, clumsiness, paresthesias or cephalgia.  ACTIVE PROBLEMS: Pseudophakia   ICD#446.0    stable postoperative Cataracts #366.9.  surgery right eye  SURGERIES: phacoemulsification  with intraocular lens implant  OD 02.13.2013  MEDICATIONS: Cosopt PF: 2%-0.5% (solution) SIG-  1 gtt in each eye 2 times a day for 30 days   Bromday: 0.09% (solution) SIG-  one drop right eye once a day    Besivance: 0.6% (suspension) SIG-  one drop right eye twice a day    Pred Forte: acetate 1% (suspension) SIG-  0ne drop right eye four times a day   Continue Same Meds     Meds Pick List:  DISCONTINUED MEDICATIONS:  not found  ALLERGIES: Prostanoid FP Receptor Agonists Severity-  Onset-  Reaction-  Status- Active Type- Drug allergy Onset-   TRAVATAN/ LUMIGAN HYPERPIGMENTATION combigan itching  INTERVENTIONS: Refraction 12.14.2012 OD: +1.25 + 0.75 x 020.....20/40 OS: +1.00 + 0.50 x 090.....20/30 Add: 2.50  OBJECTIVE: Vision:   OD: Acuity:  [place]e: 20/20 (last visit 20/25 PH 20/NI OS: Acuity:  [place]: 20/40 PH: 20/40+2  Habitual Prescription: OD: OS:  AutoRefraction: OD:  OS:   Refraction: OD: plano OS NI  Visual Fields:  OD: Visual Fields: Full To Confrontation  OS: Visual Fields: Full To Confrontation   Lids and Lashes:    Motility: Full      SLIT-LAMP EXAMINATION  Conjunctiva OD: Conjunctiva:  Clear  OS: Conjunctiva:  Clear   Pupils: OD: Pupils:    4  millimeters not detected Page Spiro OS: Pupils:    4 millimeters not detected Berna Spare  Gunn    Iris: OD: Iris:  Brown  OS: Iris:  Brown   Cornea: OD: Cornea: negative leak decreased tear  OS: Cornea:  arcus    Anterior Chamber: OD: Anterior Chamber deep with +1-trace cell and flare OS: Anterior Chamber Deep and Quiet       Lens: OD: posterior chamber intraocular lens implant in good position OS: Clear  2-3 NS   Intra-Ocular Pressures in mm of Mercury:  Target: Today: OD:14 OS:14 Time  10:19 am   Central Corneal Thickness:   Gonio: OD: Gonio   OS: Gonio      Dilation Phenylephrine 2.5% :   Condensing Lens(es) Used: 78 D   Vitreous: OD: Vitreous:  Clear OS: Vitreous:   Clear  Optic Discs: OD: Optic Discs   OS: Optic Discs  70% cup nasalization of vessels   Macula: OD: Macula: Clear   OS: Macula: Clear    Retina And Vessels: OD: Retina and Vessels  Normal  OS: Retina and Vessels Normal    Periphery: OD: Periphery   Clear OS: Periphery   Clear  Photos: OD: Photos:  OS: Photos:           HEALTH AND PHYSICAL  Blood Pressure:138/82  Pulse:78  Respiration:20  GENERAL: No acute distress  HEENT: MM's WNL, TM's normal, PERLA, Conjunctiva WNL, Fundi benign  NECK: Full ROM, no adenopathy or masses, Thyroid WNL  LUNGS: Clear to auscultation, equal BS  HEART: RR&R, no murmur, gallop, or rub, not enlarged  ABD: Soft no tenderness, BS WHNL, No Masses or Organomegaly  GENT: deferred  BJE:  normal  NEURO: CN II through XII intact  SKIN:  normal  STUDIES: A-Scan  ASSESSMENT: stable postoperative Cataracts #366.9.  surgery right eye   Assessment: Nuclear sclerosis   ICD#366.16  OS interferring with patient lifestyle  PLAN: phacoemulsification with intraocular lens implant OS  Risk & benefits reviewed & patient agrees to proceed   MEDICATION: Cosopt PF: 2%-0.5% (solution) SIG-  1 gtt in each eye 2 times a day for 30 days Dispense-   Substitutions-  Refills- 0 Notes-     FOLLOW UP:   One Day postOp   Blake Divine MD

## 2012-02-02 ENCOUNTER — Encounter (HOSPITAL_COMMUNITY): Payer: Self-pay

## 2012-02-02 ENCOUNTER — Other Ambulatory Visit: Payer: Self-pay | Admitting: Ophthalmology

## 2012-02-02 ENCOUNTER — Encounter (HOSPITAL_COMMUNITY)
Admission: RE | Admit: 2012-02-02 | Discharge: 2012-02-02 | Disposition: A | Discharge status: Home / Self Care | Payer: Medicare Other | Source: Ambulatory Visit | Attending: Ophthalmology | Admitting: Ophthalmology

## 2012-02-02 LAB — BASIC METABOLIC PANEL
BUN: 14 mg/dL (ref 6–23)
CO2: 24 mEq/L (ref 19–32)
Chloride: 102 mEq/L (ref 96–112)
Creatinine, Ser: 0.81 mg/dL (ref 0.50–1.10)
GFR calc Af Amer: 82 mL/min — ABNORMAL LOW (ref >90)
Potassium: 4 mEq/L (ref 3.5–5.1)

## 2012-02-02 LAB — CBC
HCT: 40.4 % (ref 36.0–46.0)
Hemoglobin: 13.3 g/dL (ref 12.0–15.0)
MCHC: 32.9 g/dL (ref 30.0–36.0)
MCV: 92.2 fL (ref 78.0–100.0)
RDW: 15 % (ref 11.5–15.5)
WBC: 8 10*3/uL (ref 4.0–10.5)

## 2012-02-02 NOTE — Pre-Procedure Instructions (Signed)
20 [name]  02/02/2012   Your procedure is scheduled on:  02/04/12  Report to [facility] at 1030 AM.  Call this number if you have problems the morning of surgery: [phone]   Remember:   Do not eat food:After Midnight.  May have clear liquids: up to 4 Hours before arrival.  Clear liquids include soda, tea, black coffee, apple or grape juice, broth.  Take these medicines the morning of surgery with A SIP OF WATER: eye drops   Do not wear jewelry, make-up or nail polish.  Do not wear lotions, powders, or perfumes. You may wear deodorant.  Do not shave 48 hours prior to surgery.  Do not bring valuables to the hospital.  Contacts, dentures or bridgework may not be worn into surgery.  Leave suitcase in the car. After surgery it may be brought to your room.  For patients admitted to the hospital, checkout time is 11:00 AM the day of discharge.   Patients discharged the day of surgery will not be allowed to drive home.  Name and phone number of your driver: family  Special Instructions: CHG Shower Use Special Wash: 1/2 bottle night before surgery and 1/2 bottle morning of surgery.   Please read over the following fact sheets that you were given: Pain Booklet, Coughing and Deep Breathing and Surgical Site Infection Prevention

## 2012-02-03 ENCOUNTER — Other Ambulatory Visit: Payer: Self-pay | Admitting: Ophthalmology

## 2012-02-04 ENCOUNTER — Ambulatory Visit (HOSPITAL_COMMUNITY): Payer: Medicare Other | Admitting: Anesthesiology

## 2012-02-04 ENCOUNTER — Encounter (HOSPITAL_COMMUNITY): Payer: Self-pay | Admitting: *Deleted

## 2012-02-04 ENCOUNTER — Encounter (HOSPITAL_COMMUNITY): Payer: Self-pay | Admitting: Anesthesiology

## 2012-02-04 ENCOUNTER — Encounter (HOSPITAL_COMMUNITY)
Admission: RE | Disposition: A | Discharge status: Home / Self Care | Payer: Self-pay | Source: Ambulatory Visit | Attending: Ophthalmology

## 2012-02-04 ENCOUNTER — Other Ambulatory Visit: Payer: Self-pay | Admitting: Ophthalmology

## 2012-02-04 ENCOUNTER — Ambulatory Visit (HOSPITAL_COMMUNITY)
Admission: RE | Admit: 2012-02-04 | Discharge: 2012-02-04 | Disposition: A | Discharge status: Home / Self Care | Payer: Medicare Other | Source: Ambulatory Visit | Attending: Ophthalmology | Admitting: Ophthalmology

## 2012-02-04 DIAGNOSIS — E785 Hyperlipidemia, unspecified: Secondary | ICD-10-CM

## 2012-02-04 DIAGNOSIS — Z853 Personal history of malignant neoplasm of breast: Secondary | ICD-10-CM

## 2012-02-04 DIAGNOSIS — H269 Unspecified cataract: Secondary | ICD-10-CM | POA: Insufficient documentation

## 2012-02-04 DIAGNOSIS — I10 Essential (primary) hypertension: Secondary | ICD-10-CM

## 2012-02-04 DIAGNOSIS — M300 Polyarteritis nodosa: Secondary | ICD-10-CM | POA: Insufficient documentation

## 2012-02-04 DIAGNOSIS — H2510 Age-related nuclear cataract, unspecified eye: Secondary | ICD-10-CM

## 2012-02-04 DIAGNOSIS — Z01812 Encounter for preprocedural laboratory examination: Secondary | ICD-10-CM | POA: Insufficient documentation

## 2012-02-04 HISTORY — PX: CATARACT EXTRACTION W/PHACO: SHX586

## 2012-02-04 SURGERY — PHACOEMULSIFICATION, CATARACT, WITH IOL INSERTION
Anesthesia: Monitor Anesthesia Care | Laterality: Left

## 2012-02-04 MED ORDER — BUPIVACAINE HCL 0.75 % IJ SOLN
INTRAMUSCULAR | Status: DC | PRN
  Administered 2012-02-04: 10 mL

## 2012-02-04 MED ORDER — PHENYLEPHRINE HCL 2.5 % OP SOLN
OPHTHALMIC | Status: AC
  Administered 2012-02-04: 1 [drp] via OPHTHALMIC
  Filled 2012-02-04: qty 3

## 2012-02-04 MED ORDER — PROPOFOL 10 MG/ML IV EMUL
INTRAVENOUS | Status: DC | PRN
  Administered 2012-02-04: 30 mg via INTRAVENOUS
  Administered 2012-02-04: 20 mg via INTRAVENOUS

## 2012-02-04 MED ORDER — MIDAZOLAM HCL 5 MG/5ML IJ SOLN
INTRAMUSCULAR | Status: DC | PRN
  Administered 2012-02-04: 2 mg via INTRAVENOUS

## 2012-02-04 MED ORDER — SODIUM CHLORIDE 0.9 % IV SOLN
INTRAVENOUS | Status: DC
  Administered 2012-02-04: 13:00:00 via INTRAVENOUS

## 2012-02-04 MED ORDER — LIDOCAINE-EPINEPHRINE 2 %-1:100000 IJ SOLN
INTRAMUSCULAR | Status: DC | PRN
  Administered 2012-02-04: 10 mL

## 2012-02-04 MED ORDER — TOBRAMYCIN-DEXAMETHASONE 0.3-0.1 % OP SUSP
OPHTHALMIC | Status: DC | PRN
  Administered 2012-02-04: 2 [drp] via OPHTHALMIC

## 2012-02-04 MED ORDER — HYALURONIDASE HUMAN 150 UNIT/ML IJ SOLN
INTRAMUSCULAR | Status: DC | PRN
  Administered 2012-02-04: 150 [IU]

## 2012-02-04 MED ORDER — ACETYLCHOLINE CHLORIDE 1:100 IO SOLR
INTRAOCULAR | Status: DC | PRN
  Administered 2012-02-04: 10 mg via INTRAOCULAR

## 2012-02-04 MED ORDER — BSS IO SOLN
INTRAOCULAR | Status: DC | PRN
  Administered 2012-02-04: 500 mL via INTRAOCULAR

## 2012-02-04 MED ORDER — NA CHONDROIT SULF-NA HYALURON 40-30 MG/ML IO SOLN
INTRAOCULAR | Status: DC | PRN
  Administered 2012-02-04: 0.5 mL via INTRAOCULAR

## 2012-02-04 MED ORDER — KETOROLAC TROMETHAMINE 0.5 % OP SOLN
1.0000 [drp] | OPHTHALMIC | Status: AC
  Administered 2012-02-04 (×3): 1 [drp] via OPHTHALMIC
  Filled 2012-02-04: qty 5
  Filled 2012-02-04: qty 3

## 2012-02-04 MED ORDER — CYCLOPENTOLATE-PHENYLEPHRINE 0.2-1 % OP SOLN
1.0000 [drp] | OPHTHALMIC | Status: AC
  Administered 2012-02-04 (×3): 1 [drp] via OPHTHALMIC
  Filled 2012-02-04 (×2): qty 2

## 2012-02-04 MED ORDER — BSS IO SOLN
INTRAOCULAR | Status: DC | PRN
  Administered 2012-02-04: 15 mL via INTRAOCULAR

## 2012-02-04 MED ORDER — PROVISC 10 MG/ML IO SOLN
INTRAOCULAR | Status: DC | PRN
  Administered 2012-02-04: .85 mL via INTRAOCULAR

## 2012-02-04 MED ORDER — PHENYLEPHRINE HCL 2.5 % OP SOLN
1.0000 [drp] | OPHTHALMIC | Status: AC
  Administered 2012-02-04 (×3): 1 [drp] via OPHTHALMIC
  Filled 2012-02-04: qty 2

## 2012-02-04 MED ORDER — EPINEPHRINE HCL 1 MG/ML IJ SOLN
INTRAMUSCULAR | Status: DC | PRN
  Administered 2012-02-04: .3 mg

## 2012-02-04 MED ORDER — LACTATED RINGERS IV SOLN
INTRAVENOUS | Status: DC | PRN
  Administered 2012-02-04 (×2): via INTRAVENOUS

## 2012-02-04 SURGICAL SUPPLY — 43 items
APL SRG 3 HI ABS STRL LF PLS (MISCELLANEOUS) ×1
APPLICATOR COTTON TIP 6IN STRL (MISCELLANEOUS) ×2 IMPLANT
APPLICATOR DR MATTHEWS STRL (MISCELLANEOUS) ×2 IMPLANT
BLADE KERATOME 2.75 (BLADE) ×2 IMPLANT
BLADE MINI RND TIP GREEN BEAV (BLADE) IMPLANT
BLADE STAB KNIFE 45DEG (BLADE) IMPLANT
CANNULA ANTERIOR CHAMBER 27GA (MISCELLANEOUS) ×2 IMPLANT
CLOTH BEACON ORANGE TIMEOUT ST (SAFETY) ×2 IMPLANT
CORDS BIPOLAR (ELECTRODE) IMPLANT
DRAPE OPHTHALMIC 40X48 W POUCH (DRAPES) ×2 IMPLANT
DRAPE RETRACTOR (MISCELLANEOUS) ×2 IMPLANT
FILTER BLUE MILLIPORE (MISCELLANEOUS) IMPLANT
GLOVE BIO SURGEON STRL SZ8 (GLOVE) ×2 IMPLANT
GLOVE ECLIPSE 6.5 STRL STRAW (GLOVE) ×1 IMPLANT
GOWN STRL NON-REIN LRG LVL3 (GOWN DISPOSABLE) ×4 IMPLANT
KIT ROOM TURNOVER OR (KITS) ×2 IMPLANT
KNIFE CRESCENT 2.5 55 ANG (BLADE) IMPLANT
LENS IOL ACRSF IQ PC 21.5 (Intraocular Lens) IMPLANT
LENS IOL ACRYSOF IQ POST 21.5 (Intraocular Lens) ×2 IMPLANT
MARKER SKIN DUAL TIP RULER LAB (MISCELLANEOUS) ×2 IMPLANT
MASK EYE SHIELD (GAUZE/BANDAGES/DRESSINGS) ×1 IMPLANT
NDL 25GX 5/8IN NON SAFETY (NEEDLE) ×1 IMPLANT
NDL FILTER BLUNT 18X1 1/2 (NEEDLE) IMPLANT
NEEDLE 25GX 5/8IN NON SAFETY (NEEDLE) ×4 IMPLANT
NEEDLE FILTER BLUNT 18X 1/2SAF (NEEDLE) ×1
NEEDLE FILTER BLUNT 18X1 1/2 (NEEDLE) ×1 IMPLANT
NS IRRIG 1000ML POUR BTL (IV SOLUTION) ×2 IMPLANT
PACK CATARACT CUSTOM (CUSTOM PROCEDURE TRAY) ×2 IMPLANT
PACK CATARACT MCHSCP (PACKS) ×2 IMPLANT
PAD ARMBOARD 7.5X6 YLW CONV (MISCELLANEOUS) ×4 IMPLANT
PAD EYE OVAL STERILE LF (GAUZE/BANDAGES/DRESSINGS) ×1 IMPLANT
PROBE ANTERIOR 20G W/INFUS NDL (MISCELLANEOUS) IMPLANT
SPEAR EYE SURG WECK-CEL (MISCELLANEOUS) ×1 IMPLANT
SUT ETHILON 10 0 CS140 6 (SUTURE) ×3 IMPLANT
SUT SILK 4 0 C 3 735G (SUTURE) IMPLANT
SUT SILK 6 0 G 6 (SUTURE) IMPLANT
SUT VICRYL 8 0 TG140 8 (SUTURE) IMPLANT
SYR 3ML LL SCALE MARK (SYRINGE) IMPLANT
SYR TB 1ML LUER SLIP (SYRINGE) ×1 IMPLANT
TAPE SURG TRANSPORE 1 IN (GAUZE/BANDAGES/DRESSINGS) IMPLANT
TAPE SURGICAL TRANSPORE 1 IN (GAUZE/BANDAGES/DRESSINGS) ×1
TOWEL OR 17X24 6PK STRL BLUE (TOWEL DISPOSABLE) ×4 IMPLANT
WATER STERILE IRR 1000ML POUR (IV SOLUTION) ×2 IMPLANT

## 2012-02-04 NOTE — Op Note (Addendum)
Operative note Preoperative diagnosis: Visually significant cataract left eye Postoperative diagnosis: Same Procedure: Phacoemulsification with intraocular lens implant left eye Complications: None Anesthesia: 2% Xylocaine with epinephrine a 50-50 mixture of 0.75% Marcaine with ampule of [place] Surgeon: Chalmers Guest Assistants: Trish Mage Procedure: The patient was given a peribulbar block of the aforementioned local anesthetic agent under monitored anesthesia. Following this the patient's face was prepped and draped in the usual sterile fashion. With the surgeon sitting temporally and the operating microscope and positioned a Weck-Cel sponge was used to fixate the globe and a 15 blade was used to enter through inferior clear cornea Viscoat was injected into the eye. An additional Weck-Cel sponge was used to fixate the globe and a 2.5 mm keratome blade was used in a stepwise fashion a temporal clear corneal to enter the eye additional Viscoat was injected and the 25-gauge needle was used to incise the anterior capsule and a continuous tear curvilinear capsulorrhexis was formed. Following this the capsule forceps were used to remove the anterior capsule BSS was used to hydrodissect and hydrodelineate the nucleus a Goldman ring was noted around the nucleus following this the phacoemulsification unit was then used to sculpt the nucleus forming 3 troughs the nucleus was then cracked and all nuclear fragments were removed the epinucleus was elevated using Viscoat and I/A was used to remove the epinucleus and cortical fibers from the posterior capsule. The posterior capsule remained intact therefore Provisc was injected into the eye. The intraocular lens implant was examined and was noted to have no defects it was an Alcon AcrySof SN 60 WF 21.5 diopter lens SN: [id number]. It was placed in the lens injector and unfolded the Kuglen hook was used to position intraocular lens implant and the capsular bag.  Following this the irrigation aspiration device was used to remove the viscoelastic from the eye and subincisional cortex was also removed. Miostat was injected in the eye the eye was pressurized a single 10-0 nylon suture was placed. The abdomen pressurized there've been no leakage of instrumentation removed from the eye. Topical TobraDex ointment was applied a patch and Fox U. were placed and the patient returned to recovery area in stable condition Complications none

## 2012-02-04 NOTE — Interval H&P Note (Signed)
History and Physical Interval Note:  02/04/2012 1:17 PM  Sarah Howell  has presented today for surgery, with the diagnosis of Cataract - Left  The various methods of treatment have been discussed with the patient and family. After consideration of risks, benefits and other options for treatment, the patient has consented to  Procedure(s) (LRB): CATARACT EXTRACTION PHACO AND INTRAOCULAR LENS PLACEMENT (IOC) (Left) as a surgical intervention .  The patients' history has been reviewed, patient examined, no change in status, stable for surgery.  I have reviewed the patients' chart and labs.  Questions were answered to the patient's satisfaction.     Jennife Zaucha

## 2012-02-04 NOTE — Transfer of Care (Signed)
Immediate Anesthesia Transfer of Care Note  Patient: Sarah Howell  Procedure(s) Performed: Procedure(s) (LRB): CATARACT EXTRACTION PHACO AND INTRAOCULAR LENS PLACEMENT (IOC) (Left)  Patient Location: PACU and Short Stay  Anesthesia Type: MAC  Level of Consciousness: awake, alert  and oriented  Airway & Oxygen Therapy: Patient Spontanous Breathing and on room air in wheelchair to SS section C  Post-op Assessment: Report given to PACU RN and Post -op Vital signs reviewed and stable  Post vital signs: Reviewed and stable  Complications: No apparent anesthesia complications

## 2012-02-04 NOTE — Anesthesia Preprocedure Evaluation (Addendum)
Anesthesia Evaluation  Patient identified by MRN, date of birth, ID band Patient awake    Reviewed: Allergy & Precautions, H&P , NPO status , Patient's Chart, lab work & pertinent test results  Airway Mallampati: I  Neck ROM: Full    Dental  (+) Teeth Intact and Dental Advisory Given   Pulmonary shortness of breath and with exertion,  breath sounds clear to auscultation        Cardiovascular hypertension, Pt. on medications Rhythm:Regular Rate:Normal     Neuro/Psych negative neurological ROS  negative psych ROS   GI/Hepatic negative GI ROS, Neg liver ROS,   Endo/Other  negative endocrine ROS  Renal/GU negative Renal ROS  negative genitourinary   Musculoskeletal negative musculoskeletal ROS (+)   Abdominal   Peds  Hematology negative hematology ROS (+)   Anesthesia Other Findings   Reproductive/Obstetrics negative OB ROS                         Anesthesia Physical Anesthesia Plan  ASA: II  Anesthesia Plan: MAC   Post-op Pain Management:    Induction: Intravenous  Airway Management Planned: Natural Airway, Simple Face Mask and Nasal Cannula  Additional Equipment:   Intra-op Plan:   Post-operative Plan:   Informed Consent: I have reviewed the patients History and Physical, chart, labs and discussed the procedure including the risks, benefits and alternatives for the proposed anesthesia with the patient or authorized representative who has indicated his/her understanding and acceptance.   Dental advisory given  Plan Discussed with: CRNA, Surgeon and Anesthesiologist  Anesthesia Plan Comments:        Anesthesia Quick Evaluation

## 2012-02-04 NOTE — H&P (View-Only) (Signed)
H I S T O R Y   A N D   P H Y S I C A L  Patient's Name Sarah Howell Date   01/20/2012  Patient's Age 71 Time 12:33  Medical Record # [id number] Doctor Chalmers Guest   SUBJECTIVE: Referred By:    Chief Complaint:  patient notes blurry vision OS interfering  with with daily activities  Patient had phacoemulsification with intraocular lens implant OD 02.13.2013 and Scheduled for phacoemulsification with intraocular lens implant  OS 04.10.2013    HISTORY OF PRESENT ILLNESS:  Onset:  6 weeks  Duration:  6 weeks  Quality:   improved  Location:  right  Intensity / Severity:  moderate  Context / When:   just happened  Mental Status: Oriented to time and place  REVIEW OF SYSTEMS   GEN: Denies weight changes, appetite changes, unusual weakness, bleeding, fever, chills, recent trauma or infections  HEENT: Denies vision changes, hearing changes, epistaxis, unusual sneezing, sore throat, swallowing difficulties, ear pain, or facial pain.  NECK: Denies neck pain swelling or stiffness.  LUNGS: Denies cough, dyspnea, orthopnea, or hemoptosis.  HEART: Denies palpitations or chest pain. Confirms HIGH BLOOD PRESSURE.  ABD: Denies abdomen pain, eructation, nausea, vomiting, hematemesis, diarrhea, constipation, hematochezia, melena, acholic stools, or flatulence.  GENT: Denies recent dysruia, urine frequency, urine hesitancy, urine urgency, urine flow-slow, urine retention, nocturia, polyuria, dark urine, or incontinence.  BJE: Denies arthralgia, joint stiffness, back pain, muscle cramps, or myalgia.  SKIN: Denies rashes, lesions, anhidrosis, bruising, or pruritus.  NEURO: Denies memory loss, disorientation, syncope, diplopia, dizziness, vertigo, clumsiness, paresthesias or cephalgia.  ACTIVE PROBLEMS: Pseudophakia   ICD#446.0    stable postoperative Cataracts #366.9.  surgery right eye  SURGERIES: phacoemulsification  with intraocular lens implant  OD 02.13.2013  MEDICATIONS: Cosopt PF: 2%-0.5% (solution) SIG-  1 gtt in each eye 2 times a day for 30 days   Bromday: 0.09% (solution) SIG-  one drop right eye once a day    Besivance: 0.6% (suspension) SIG-  one drop right eye twice a day    Pred Forte: acetate 1% (suspension) SIG-  0ne drop right eye four times a day   Continue Same Meds     Meds Pick List:  DISCONTINUED MEDICATIONS:  not found  ALLERGIES: Prostanoid FP Receptor Agonists Severity-  Onset-  Reaction-  Status- Active Type- Drug allergy Onset-   TRAVATAN/ LUMIGAN HYPERPIGMENTATION combigan itching  INTERVENTIONS: Refraction 12.14.2012 OD: +1.25 + 0.75 x 020.....20/40 OS: +1.00 + 0.50 x 090.....20/30 Add: 2.50  OBJECTIVE: Vision:   OD: Acuity:  [place]: 20/20 (last visit 20/25 PH 20/NI OS: Acuity:  [place]: 20/40 PH: 20/40+2  Habitual Prescription: OD: OS:  AutoRefraction: OD:  OS:   Refraction: OD: plano OS NI  Visual Fields:  OD: Visual Fields: Full To Confrontation  OS: Visual Fields: Full To Confrontation   Lids and Lashes:    Motility: Full      SLIT-LAMP EXAMINATION  Conjunctiva OD: Conjunctiva:  Clear  OS: Conjunctiva:  Clear   Pupils: OD: Pupils:    4  millimeters not detected Page Spiro OS: Pupils:    4 millimeters not detected Berna Spare  Gunn    Iris: OD: Iris:  Brown  OS: Iris:  Brown   Cornea: OD: Cornea: negative leak decreased tear  OS: Cornea:  arcus    Anterior Chamber: OD: Anterior Chamber deep with +1-trace cell and flare OS: Anterior Chamber Deep and Quiet       Lens: OD: posterior chamber intraocular lens implant in good position OS: Clear  2-3 NS   Intra-Ocular Pressures in mm of Mercury:  Target: Today: OD:14 OS:14 Time  10:19 am   Central Corneal Thickness:   Gonio: OD: Gonio   OS: Gonio      Dilation Phenylephrine 2.5% :   Condensing Lens(es) Used: 78 D   Vitreous: OD: Vitreous:  Clear OS: Vitreous:   Clear  Optic Discs: OD: Optic Discs   OS: Optic Discs  70% cup nasalization of vessels   Macula: OD: Macula: Clear   OS: Macula: Clear    Retina And Vessels: OD: Retina and Vessels  Normal  OS: Retina and Vessels Normal    Periphery: OD: Periphery   Clear OS: Periphery   Clear  Photos: OD: Photos:  OS: Photos:           HEALTH AND PHYSICAL  Blood Pressure:138/82  Pulse:78  Respiration:20  GENERAL: No acute distress  HEENT: MM's WNL, TM's normal, PERLA, Conjunctiva WNL, Fundi benign  NECK: Full ROM, no adenopathy or masses, Thyroid WNL  LUNGS: Clear to auscultation, equal BS  HEART: RR&R, no murmur, gallop, or rub, not enlarged  ABD: Soft no tenderness, BS WHNL, No Masses or Organomegaly  GENT: deferred  BJE:  normal  NEURO: CN II through XII intact  SKIN:  normal  STUDIES: A-Scan  ASSESSMENT: stable postoperative Cataracts #366.9.  surgery right eye   Assessment: Nuclear sclerosis   ICD#366.16  OS interferring with patient lifestyle  PLAN: phacoemulsification with intraocular lens implant OS  Risk & benefits reviewed & patient agrees to proceed   MEDICATION: Cosopt PF: 2%-0.5% (solution) SIG-  1 gtt in each eye 2 times a day for 30 days Dispense-   Substitutions-  Refills- 0 Notes-     FOLLOW UP:   One Day postOp   Blake Divine MD

## 2012-02-04 NOTE — Preoperative (Signed)
Beta Blockers   Reason not to administer Beta Blockers:Not Applicable 

## 2012-02-05 ENCOUNTER — Encounter (HOSPITAL_COMMUNITY): Payer: Self-pay | Admitting: Ophthalmology

## 2012-02-10 NOTE — Anesthesia Postprocedure Evaluation (Signed)
  Anesthesia Post-op Note  Patient: Sarah Howell  Procedure(s) Performed: Procedure(s) (LRB): CATARACT EXTRACTION PHACO AND INTRAOCULAR LENS PLACEMENT (IOC) (Left)  Patient Location: PACU and Short Stay  Anesthesia Type: MAC  Level of Consciousness: awake, alert  and oriented  Airway and Oxygen Therapy: Patient Spontanous Breathing  Post-op Pain: none  Post-op Assessment: Post-op Vital signs reviewed  Post-op Vital Signs: Reviewed  Complications: No apparent anesthesia complications

## 2012-02-26 ENCOUNTER — Other Ambulatory Visit (INDEPENDENT_AMBULATORY_CARE_PROVIDER_SITE_OTHER): Payer: Self-pay | Admitting: General Surgery

## 2012-02-26 DIAGNOSIS — Z1231 Encounter for screening mammogram for malignant neoplasm of breast: Secondary | ICD-10-CM

## 2012-03-12 ENCOUNTER — Other Ambulatory Visit: Payer: Self-pay | Admitting: Family Medicine

## 2012-03-12 NOTE — Telephone Encounter (Signed)
PATIENT NEEDS TO AN OFFICE VISIT BEFORE RX RUNS OUT.

## 2012-03-17 ENCOUNTER — Other Ambulatory Visit (INDEPENDENT_AMBULATORY_CARE_PROVIDER_SITE_OTHER): Payer: Self-pay | Admitting: General Surgery

## 2012-03-17 ENCOUNTER — Ambulatory Visit
Admission: RE | Admit: 2012-03-17 | Discharge: 2012-03-17 | Disposition: A | Discharge status: Home / Self Care | Payer: Medicare Other | Source: Ambulatory Visit | Attending: General Surgery | Admitting: General Surgery

## 2012-03-17 DIAGNOSIS — Z1231 Encounter for screening mammogram for malignant neoplasm of breast: Secondary | ICD-10-CM

## 2012-04-06 ENCOUNTER — Ambulatory Visit (INDEPENDENT_AMBULATORY_CARE_PROVIDER_SITE_OTHER): Payer: Medicare Other | Admitting: Family Medicine

## 2012-04-06 ENCOUNTER — Encounter: Payer: Self-pay | Admitting: Family Medicine

## 2012-04-06 VITALS — BP 140/90 | HR 75 | Wt 200.0 lb

## 2012-04-06 DIAGNOSIS — Z79899 Other long term (current) drug therapy: Secondary | ICD-10-CM

## 2012-04-06 DIAGNOSIS — J3089 Other allergic rhinitis: Secondary | ICD-10-CM

## 2012-04-06 DIAGNOSIS — L659 Nonscarring hair loss, unspecified: Secondary | ICD-10-CM

## 2012-04-06 DIAGNOSIS — I10 Essential (primary) hypertension: Secondary | ICD-10-CM

## 2012-04-06 DIAGNOSIS — J302 Other seasonal allergic rhinitis: Secondary | ICD-10-CM

## 2012-04-06 DIAGNOSIS — H269 Unspecified cataract: Secondary | ICD-10-CM

## 2012-04-06 DIAGNOSIS — M949 Disorder of cartilage, unspecified: Secondary | ICD-10-CM

## 2012-04-06 DIAGNOSIS — M8580 Other specified disorders of bone density and structure, unspecified site: Secondary | ICD-10-CM

## 2012-04-06 DIAGNOSIS — E785 Hyperlipidemia, unspecified: Secondary | ICD-10-CM

## 2012-04-06 DIAGNOSIS — M899 Disorder of bone, unspecified: Secondary | ICD-10-CM

## 2012-04-06 DIAGNOSIS — J309 Allergic rhinitis, unspecified: Secondary | ICD-10-CM

## 2012-04-06 NOTE — Patient Instructions (Signed)
Walk for least 20 minutes every day. Start joining your husband at the gym.

## 2012-04-06 NOTE — Progress Notes (Signed)
  Subjective:    Patient ID: Sarah Howell, female    DOB: June 24, 1941, 71 y.o.   MRN: [id number]  HPI She is here for medication check. She continues on medications listed in the chart. Her main concern today is lack of weight loss. She is on the Weight [employer] program but at this time is only walking once or twice per week. She does have a membership her husband uses but she is not going with him. She does have underlying allergies and takes medications on regular basis. She does have alopecia and presently is on a finasteride. She is being followed by dermatology at [facility] for this. She recently had cataract surgery and seems to be doing quite well with this. She does have a previous history of breast cancer but is not sure she was ever on tamoxifen. She does have a history of osteopenia. She has been taking a multivitamin and calcium. She does not smoke or drink. She has been married for 47 years.   Review of Systems Negative except as above    Objective:   Physical Exam alert and in no distress. Tympanic membranes and canals are normal. Throat is clear. Tonsils are normal. Neck is supple without adenopathy or thyromegaly. Cardiac exam shows a regular sinus rhythm without murmurs or gallops. Lungs are clear to auscultation. Blood results from April were reviewed.       Assessment & Plan:   1. Hypertension    2. Hyperlipidemia LDL goal <100  Lipid panel  3. Perennial allergic rhinitis with seasonal variation    4. Osteopenia  DG Bone Density  5. Cataracts, bilateral    6. Encounter for long-term (current) use of other medications  Lipid panel  7. Obesity, Class III, BMI 40-49.9 (morbid obesity)    8. Alopecia      DEXA scan will be ordered. I will check a lipid panel on her. Discussed diet and exercise with her. Encouraged her to walk daily for at least 20 minutes in potentially join her husband at the gym. She is to continue on all her present medications.

## 2012-04-07 NOTE — Progress Notes (Signed)
lmtrc at home and cell

## 2012-04-08 ENCOUNTER — Encounter: Payer: Self-pay | Admitting: Family Medicine

## 2012-04-22 ENCOUNTER — Other Ambulatory Visit: Payer: Self-pay | Admitting: Family Medicine

## 2012-05-13 ENCOUNTER — Encounter (INDEPENDENT_AMBULATORY_CARE_PROVIDER_SITE_OTHER): Payer: Self-pay

## 2012-05-24 ENCOUNTER — Telehealth: Payer: Self-pay | Admitting: Internal Medicine

## 2012-05-24 MED ORDER — FLUTICASONE PROPIONATE 50 MCG/ACT NA SUSP: 2.0000 | Freq: Every day | NASAL | Status: DC

## 2012-05-24 NOTE — Telephone Encounter (Signed)
Sent flonase in per [employer]

## 2012-05-24 NOTE — Telephone Encounter (Signed)
Call in some Flonase for her

## 2012-06-08 ENCOUNTER — Ambulatory Visit (INDEPENDENT_AMBULATORY_CARE_PROVIDER_SITE_OTHER): Payer: Medicare Other | Admitting: General Surgery

## 2012-06-08 ENCOUNTER — Encounter (INDEPENDENT_AMBULATORY_CARE_PROVIDER_SITE_OTHER): Payer: Self-pay | Admitting: General Surgery

## 2012-06-08 VITALS — BP 160/90 | HR 76 | Temp 97.4°F | Resp 16 | Ht 60.0 in | Wt 199.0 lb

## 2012-06-08 DIAGNOSIS — Z853 Personal history of malignant neoplasm of breast: Secondary | ICD-10-CM

## 2012-06-08 NOTE — Assessment & Plan Note (Addendum)
No evidence of recurrent disease.  Follow up in 1 year.    Continue self breast exams.

## 2012-06-08 NOTE — Patient Instructions (Addendum)
Follow-up in 1-year after mammogram

## 2012-06-14 NOTE — Progress Notes (Signed)
Sarah Howell is a 71 y.o. female.    Chief Complaint  Patient presents with  . Breast Cancer Long Term Follow Up    HPI Patient is a 71 year old female who had breast cancer in 1989. She had mastectomy and reconstruction.  She has kept up to date with her health maintenance. Her mammogram in May demonstrated no evidence of malignancy. This was done at [facility] [place]. She has not palpated any new lesions. She denies any change in her overall health status. She has not had any chest wall pain or alteration in her breast contour. She denies any new bony pain.  Past Medical History  Diagnosis Date  . Alopecia   . Osteopenia   . Hyperlipidemia   . Cough   . Allergy   . Glaucoma     bilateral  . Cancer     BREAST  . Shortness of breath     WITH EXERTION   . Hypertension     dr Catha Brow    Past Surgical History  Procedure Date  . Lt breast ca reconstruction 07/28/1988  . Orthoscopic surgery lt knee 2002  . Orthoscopic surgery rt knee 08/05/2009  . Rotoator cuff cleaning   . Breast surgery     lft br mastectomy and reconstruction  . Cataract extraction w/phaco 12/10/2011    Procedure: CATARACT EXTRACTION PHACO AND INTRAOCULAR LENS PLACEMENT (IOC);  Surgeon: Chalmers Guest, MD;  Location: [facility] OR;  Service: Ophthalmology;  Laterality: Right;  . Cataract extraction w/phaco 02/04/2012    Procedure: CATARACT EXTRACTION PHACO AND INTRAOCULAR LENS PLACEMENT (IOC);  Surgeon: Chalmers Guest, MD;  Location: [facility] OR;  Service: Ophthalmology;  Laterality: Left;    Family History  Problem Relation Age of Onset  . Cancer Mother     colon  . Cancer Father     prostate  . Cancer Brother     pancreatic    Social History History  Substance Use Topics  . Smoking status: Former Games developer  . Smokeless tobacco: Never Used  . Alcohol Use: No    Allergies  Allergen Reactions  . Fish Allergy Nausea And Vomiting    Current Outpatient Prescriptions  Medication Sig Dispense  Refill  . Calcium Carbonate-Vitamin D (CALCIUM + D PO) Take 1 tablet by mouth 2 (two) times daily.       . dorzolamide-timolol (COSOPT) 22.3-6.8 MG/ML ophthalmic solution Place 1 drop into both eyes 2 (two) times daily.      . fexofenadine (ALLEGRA) 180 MG tablet Take 1 tablet (180 mg total) by mouth daily.  30 tablet  11  . finasteride (PROSCAR) 5 MG tablet Take 2.5 mg by mouth daily. Patient uses for hair growth.      . fluticasone (FLONASE) 50 MCG/ACT nasal spray Place 2 sprays into the nose daily.  16 g  2  . losartan (COZAAR) 25 MG tablet Take 25 mg by mouth daily.      . pravastatin (PRAVACHOL) 80 MG tablet TAKE 1 TABLET (80 MG TOTAL) BY MOUTH EVERY EVENING.  30 tablet  5  . Polyethyl Glycol-Propyl Glycol (SYSTANE ULTRA) 0.4-0.3 % SOLN Place 1 drop into both eyes 3 (three) times daily as needed. dry eyes.        Review of Systems Review of Systems    All other systems reviewed and are negative.  Wt Readings from Last 3 Encounters:  06/08/12 199 lb (90.266 kg)  04/06/12 200 lb (90.719 kg)  02/02/12 200  lb 6.4 oz (90.9 kg)   Temp Readings from Last 3 Encounters:  06/08/12 97.4 F (36.3 C) Temporal  02/04/12 97 F (36.1 C) Oral  02/04/12 97 F (36.1 C) Oral   BP Readings from Last 3 Encounters:  06/08/12 160/90  04/06/12 140/90  02/04/12 162/79   Pulse Readings from Last 3 Encounters:  06/08/12 76  04/06/12 75  02/04/12 71     Physical Exam Physical Exam  Constitutional: She is oriented to person, place, and time. She appears well-developed and well-nourished. No distress.  Head: Normocephalic and atraumatic.  Eyes: Conjunctivae are normal. Pupils are equal, round, and reactive to light. No scleral icterus.  Neck: Normal range of motion. Neck supple. No tracheal deviation present. No thyromegaly present.  Cardiovascular: Normal rate, regular rhythm and intact distal pulses.   Respiratory: Effort normal. No respiratory distress. She exhibits no tenderness. Right  breast exhibits no inverted nipple, no mass, no nipple discharge, no skin change and no tenderness. Left breast exhibits no inverted nipple, no mass, no nipple discharge, no skin change and no tenderness.       L breast reconstructed, no masses or tenderness R breast with biopsy scar  GI: Soft. She exhibits no distension and no mass. There is no tenderness. There is no rebound and no guarding.  Musculoskeletal: Normal range of motion. She exhibits no edema and no tenderness.  Lymphadenopathy:    She has no cervical adenopathy. No supraclavicular adenopathy.  Neurological: She is alert and oriented to person, place, and time. Coordination normal.  Skin: Skin is warm and dry. No rash noted. She is not diaphoretic. No erythema. No pallor.  Psychiatric: She has a normal mood and affect. Her behavior is normal. Judgment and thought content normal.     Assessment/Plan History of breast cancer in female, s/p L MRM with reconstruction 1989 w [facility] No evidence of recurrent disease.  Follow up in 1 year.    Continue self breast exams.       Jilleen Essner 06/14/2012, 1:31 PM

## 2012-06-24 ENCOUNTER — Other Ambulatory Visit: Payer: Self-pay | Admitting: Family Medicine

## 2012-09-09 ENCOUNTER — Other Ambulatory Visit: Payer: Medicare Other

## 2012-09-09 DIAGNOSIS — Z23 Encounter for immunization: Secondary | ICD-10-CM

## 2012-09-09 MED ORDER — INFLUENZA VIRUS VACC SPLIT PF IM SUSP: 0.5000 mL | INTRAMUSCULAR | Status: AC

## 2012-09-21 ENCOUNTER — Encounter: Payer: Self-pay | Admitting: Internal Medicine

## 2012-10-05 ENCOUNTER — Ambulatory Visit (INDEPENDENT_AMBULATORY_CARE_PROVIDER_SITE_OTHER): Payer: Medicare Other | Admitting: Family Medicine

## 2012-10-05 ENCOUNTER — Encounter: Payer: Self-pay | Admitting: Family Medicine

## 2012-10-05 VITALS — BP 138/82 | HR 90 | Wt 194.0 lb

## 2012-10-05 DIAGNOSIS — E669 Obesity, unspecified: Secondary | ICD-10-CM

## 2012-10-05 DIAGNOSIS — J309 Allergic rhinitis, unspecified: Secondary | ICD-10-CM

## 2012-10-05 DIAGNOSIS — E785 Hyperlipidemia, unspecified: Secondary | ICD-10-CM

## 2012-10-05 DIAGNOSIS — Z853 Personal history of malignant neoplasm of breast: Secondary | ICD-10-CM

## 2012-10-05 DIAGNOSIS — Z79899 Other long term (current) drug therapy: Secondary | ICD-10-CM

## 2012-10-05 DIAGNOSIS — L659 Nonscarring hair loss, unspecified: Secondary | ICD-10-CM

## 2012-10-05 DIAGNOSIS — I10 Essential (primary) hypertension: Secondary | ICD-10-CM

## 2012-10-05 LAB — COMPREHENSIVE METABOLIC PANEL
ALT: 10 U/L (ref 0–35)
Alkaline Phosphatase: 74 U/L (ref 39–117)
Glucose, Bld: 102 mg/dL — ABNORMAL HIGH (ref 70–99)
Sodium: 139 mEq/L (ref 135–145)
Total Bilirubin: 0.8 mg/dL (ref 0.3–1.2)
Total Protein: 8.2 g/dL (ref 6.0–8.3)

## 2012-10-05 LAB — LIPID PANEL
Cholesterol: 174 mg/dL (ref 0–200)
Total CHOL/HDL Ratio: 4.4 Ratio
Triglycerides: 100 mg/dL (ref <150)
VLDL: 20 mg/dL (ref 0–40)

## 2012-10-05 LAB — CBC WITH DIFFERENTIAL/PLATELET
Basophils Relative: 1 % (ref 0–1)
Eosinophils Absolute: 0.2 10*3/uL (ref 0.0–0.7)
MCH: 31.9 pg (ref 26.0–34.0)
MCHC: 33.8 g/dL (ref 30.0–36.0)
Neutrophils Relative %: 51 % (ref 43–77)
Platelets: 295 10*3/uL (ref 150–400)

## 2012-10-05 MED ORDER — FLUTICASONE PROPIONATE 50 MCG/ACT NA SUSP: 2.0000 | Freq: Every day | NASAL | Status: DC

## 2012-10-05 MED ORDER — PRAVASTATIN SODIUM 80 MG PO TABS: 80.0000 mg | ORAL_TABLET | Freq: Every day | ORAL | Status: DC

## 2012-10-05 MED ORDER — LOSARTAN POTASSIUM 25 MG PO TABS: 25.0000 mg | ORAL_TABLET | Freq: Every day | ORAL | Status: DC

## 2012-10-05 MED ORDER — FINASTERIDE 5 MG PO TABS: 2.5000 mg | ORAL_TABLET | Freq: Every day | ORAL | Status: DC

## 2012-10-05 NOTE — Progress Notes (Signed)
  Subjective:    Patient ID: Sarah Howell, female    DOB: 1941-04-21, 71 y.o.   MRN: [id number]  HPI She is here for an interval evaluation. She continues on her allergy medicines. Her main symptoms are nasal congestion with slight rhinorrhea and PND. She continues on her blood pressure medication and is having no difficulty with that. She also takes Pravachol. She was placed on finasteride by her dermatologist and would like to continue on this. She gets yearly followup concerning her breast cancer. Otherwise she has no particular concerns or complaints.   Review of Systems     Objective:   Physical Exam alert and in no distress. Tympanic membranes and canals are normal. Throat is clear. Tonsils are normal. Neck is supple without adenopathy or thyromegaly. Cardiac exam shows a regular sinus rhythm without murmurs or gallops. Lungs are clear to auscultation.        Assessment & Plan:   1. Allergic rhinitis, mild  fluticasone (FLONASE) 50 MCG/ACT nasal spray  2. Hypertension  CBC with Differential, Comprehensive metabolic panel, losartan (COZAAR) 25 MG tablet  3. Hyperlipidemia LDL goal <100  Lipid panel, pravastatin (PRAVACHOL) 80 MG tablet  4. History of breast cancer in female, s/p L MRM with reconstruction 1989 w [place]  CBC with Differential, Comprehensive metabolic panel  5. Obesity (BMI 30-39.9)    6. Alopecia  finasteride (PROSCAR) 5 MG tablet  7. Encounter for long-term (current) use of other medications  Lipid panel, CBC with Differential, Comprehensive metabolic panel   tinea on present medication regimen.

## 2012-10-06 NOTE — Progress Notes (Signed)
Quick Note:  The blood work is normal ______ 

## 2012-10-06 NOTE — Progress Notes (Signed)
Quick Note:  CALLED PT HOME # PT INFORMED OF LABS AND VERBALIZED UNDERSTANDING  ______ 

## 2012-10-28 ENCOUNTER — Telehealth: Payer: Self-pay | Admitting: Internal Medicine

## 2012-10-28 NOTE — Telephone Encounter (Signed)
Called and informed pt rx for shingles in ready

## 2012-11-10 ENCOUNTER — Other Ambulatory Visit: Payer: Self-pay | Admitting: Family Medicine

## 2012-12-15 LAB — HM COLONOSCOPY

## 2013-02-21 ENCOUNTER — Other Ambulatory Visit: Payer: Self-pay

## 2013-02-21 DIAGNOSIS — Z1231 Encounter for screening mammogram for malignant neoplasm of breast: Secondary | ICD-10-CM

## 2013-03-22 ENCOUNTER — Ambulatory Visit
Admission: RE | Admit: 2013-03-22 | Discharge: 2013-03-22 | Disposition: A | Discharge status: Home / Self Care | Payer: Medicare Other | Source: Ambulatory Visit

## 2013-03-22 DIAGNOSIS — Z1231 Encounter for screening mammogram for malignant neoplasm of breast: Secondary | ICD-10-CM

## 2013-03-29 ENCOUNTER — Ambulatory Visit: Payer: Self-pay | Admitting: Family Medicine

## 2013-04-07 ENCOUNTER — Ambulatory Visit (INDEPENDENT_AMBULATORY_CARE_PROVIDER_SITE_OTHER): Payer: Medicare Other | Admitting: Family Medicine

## 2013-04-07 ENCOUNTER — Ambulatory Visit
Admission: RE | Admit: 2013-04-07 | Discharge: 2013-04-07 | Disposition: A | Discharge status: Home / Self Care | Payer: Medicare Other | Source: Ambulatory Visit | Attending: Family Medicine | Admitting: Family Medicine

## 2013-04-07 ENCOUNTER — Encounter: Payer: Self-pay | Admitting: Family Medicine

## 2013-04-07 VITALS — BP 134/88 | HR 90 | Temp 99.3°F | Wt 195.0 lb

## 2013-04-07 DIAGNOSIS — R059 Cough, unspecified: Secondary | ICD-10-CM

## 2013-04-07 DIAGNOSIS — R05 Cough: Secondary | ICD-10-CM

## 2013-04-07 DIAGNOSIS — J209 Acute bronchitis, unspecified: Secondary | ICD-10-CM

## 2013-04-07 MED ORDER — CLARITHROMYCIN 500 MG PO TABS: 500.0000 mg | ORAL_TABLET | Freq: Two times a day (BID) | ORAL | Status: DC

## 2013-04-07 MED ORDER — BENZONATATE 100 MG PO CAPS: 100.0000 mg | ORAL_CAPSULE | Freq: Three times a day (TID) | ORAL | Status: DC | PRN

## 2013-04-07 NOTE — Patient Instructions (Signed)
You can also take Nyquil at night

## 2013-04-07 NOTE — Progress Notes (Signed)
  Subjective:    Patient ID: Sarah Howell, female    DOB: [birth year], 72 y.o.   MRN: [id number]  HPI She has a one-month history of aggressively worsening cough but no fever, chills, sore throat, earache. The coughing is getting to the extent that it does interfere with her ADLs specifically driving. She also complains of sinus pressure and, rhinorrhea and PND is clear. He has been on no medications. She does not smoke.   Review of Systems     Objective:   Physical Exam alert and in no distress. Tympanic membranes and canals are normal. Throat is clear. Tonsils are normal. Neck is supple without adenopathy or thyromegaly. Cardiac exam shows a regular sinus rhythm without murmurs or gallops. Lungs are clear to auscultation. She does have some slight tenderness to palpation over her frontal ethmoid and maxillary sinuses. Test x-ray shows no acute findings.       Assessment & Plan:  Cough - Plan: DG Chest 2 View, benzonatate (TESSALON) 100 MG capsule  Acute bronchitis - Plan: clarithromycin (BIAXIN) 500 MG tablet, benzonatate (TESSALON) 100 MG capsule she is to call if she does not get better.

## 2013-04-12 ENCOUNTER — Ambulatory Visit: Payer: Self-pay | Admitting: Family Medicine

## 2013-04-15 ENCOUNTER — Telehealth: Payer: Self-pay | Admitting: Family Medicine

## 2013-04-15 DIAGNOSIS — R05 Cough: Secondary | ICD-10-CM

## 2013-04-15 DIAGNOSIS — J209 Acute bronchitis, unspecified: Secondary | ICD-10-CM

## 2013-04-15 DIAGNOSIS — R059 Cough, unspecified: Secondary | ICD-10-CM

## 2013-04-15 MED ORDER — AMOXICILLIN-POT CLAVULANATE 875-125 MG PO TABS: 1.0000 | ORAL_TABLET | Freq: Two times a day (BID) | ORAL | Status: DC

## 2013-04-15 MED ORDER — BENZONATATE 100 MG PO CAPS: 100.0000 mg | ORAL_CAPSULE | Freq: Three times a day (TID) | ORAL | Status: DC | PRN

## 2013-04-15 NOTE — Telephone Encounter (Signed)
Let her know that I called in a new antibiotic and more cough medication. Have her let us know how she is doing in about 10 days

## 2013-04-15 NOTE — Telephone Encounter (Signed)
Pt cough no better [facility].  Coughing a lot .

## 2013-04-15 NOTE — Telephone Encounter (Signed)
Left pt message re new rx and to let us know in 10 days how she is doing.

## 2013-04-18 ENCOUNTER — Telehealth: Payer: Self-pay | Admitting: Family Medicine

## 2013-04-18 MED ORDER — HYDROCOD POLST-CHLORPHEN POLST 10-8 MG/5ML PO LQCR: 5.0000 mL | Freq: Two times a day (BID) | ORAL | Status: DC

## 2013-04-18 NOTE — Telephone Encounter (Signed)
Talked to pharmacist the only two that are left [facility] AND [facility] please advise

## 2013-04-18 NOTE — Telephone Encounter (Signed)
Pt wanted a different cough medicine called in last Friday, no the Tessalon pearls as they are not working for her.  We called in pearls again.  She did not pick them up.  Please [facility]  [address]

## 2013-04-18 NOTE — Telephone Encounter (Signed)
Give her Tussionex but have her use it only at night. If she still having difficulty in a week have her make an appoint

## 2013-04-18 NOTE — Telephone Encounter (Signed)
CALLED PT TO LET HER KNOW TUSSINEX

## 2013-04-18 NOTE — Telephone Encounter (Signed)
Check with the pharmacist on a non-codeine cough medicine, preferably something other than Tessalon or dextromethorphan

## 2013-05-02 ENCOUNTER — Telehealth: Payer: Self-pay | Admitting: Family Medicine

## 2013-05-02 MED ORDER — HYDROCOD POLST-CHLORPHEN POLST 10-8 MG/5ML PO LQCR: 5.0000 mL | Freq: Two times a day (BID) | ORAL | Status: DC

## 2013-05-02 NOTE — Telephone Encounter (Signed)
Give her refill on the Tussionex but explain that she needs more, she will need to be reevaluated

## 2013-05-02 NOTE — Telephone Encounter (Signed)
Called cough med in per [employer]

## 2013-05-31 ENCOUNTER — Ambulatory Visit (INDEPENDENT_AMBULATORY_CARE_PROVIDER_SITE_OTHER): Payer: Medicare Other | Admitting: General Surgery

## 2013-05-31 ENCOUNTER — Encounter (INDEPENDENT_AMBULATORY_CARE_PROVIDER_SITE_OTHER): Payer: Self-pay | Admitting: General Surgery

## 2013-05-31 VITALS — BP 142/86 | HR 82 | Temp 97.0°F | Ht 60.0 in | Wt 197.4 lb

## 2013-05-31 DIAGNOSIS — Z853 Personal history of malignant neoplasm of breast: Secondary | ICD-10-CM

## 2013-05-31 NOTE — Assessment & Plan Note (Signed)
No clinical evidence of disease.  Follow up in 1 year if desired.

## 2013-05-31 NOTE — Patient Instructions (Signed)
Continue good health.  Get mammogram next spring.  Follow up in 1 year.

## 2013-05-31 NOTE — Progress Notes (Signed)
Sarah Howell is a 72 y.o. female.    Chief Complaint  Patient presents with  . Breast Cancer Long Term Follow Up    yrly br ck    HPI Patient is a 72 year old female who had breast cancer in 1989. She had mastectomy and reconstruction.  She has kept up to date with her health maintenance. She has not had any new breast complaints.  She denies any new breast masses or nipple/skin changes.    Past Medical History  Diagnosis Date  . Alopecia   . Osteopenia   . Hyperlipidemia   . Cough   . Allergy   . Glaucoma     bilateral  . Cancer     BREAST  . Shortness of breath     WITH EXERTION   . Hypertension     dr Catha Brow    Past Surgical History  Procedure Laterality Date  . Lt breast ca reconstruction  07/28/1988  . Orthoscopic surgery lt knee  2002  . Orthoscopic surgery rt knee  08/05/2009  . Rotoator cuff cleaning    . Breast surgery      lft br mastectomy and reconstruction  . Cataract extraction w/phaco  12/10/2011    Procedure: CATARACT EXTRACTION PHACO AND INTRAOCULAR LENS PLACEMENT (IOC);  Surgeon: Chalmers Guest, MD;  Location: [facility] OR;  Service: Ophthalmology;  Laterality: Right;  . Cataract extraction w/phaco  02/04/2012    Procedure: CATARACT EXTRACTION PHACO AND INTRAOCULAR LENS PLACEMENT (IOC);  Surgeon: Chalmers Guest, MD;  Location: [facility] OR;  Service: Ophthalmology;  Laterality: Left;  [facility] surgery Bilateral 2013    cataract surgery    Family History  Problem Relation Age of Onset  . Cancer Mother     colon  . Cancer Father     prostate  . Cancer Brother     pancreatic    Social History History  Substance Use Topics  . Smoking status: Former Games developer  . Smokeless tobacco: Never Used  . Alcohol Use: No    Allergies  Allergen Reactions  . Fish Allergy Nausea And Vomiting    Current Outpatient Prescriptions  Medication Sig Dispense Refill  . benzonatate (TESSALON) 100 MG capsule Take 1 capsule (100 mg total) by mouth 3 (three) times daily as needed  for cough.  30 capsule  0  . Calcium Carbonate-Vitamin D (CALCIUM + D PO) Take 1 tablet by mouth 2 (two) times daily.       . [facility] ALLERGY RELIEF 180 MG tablet TAKE 1 TABLET (180 MG TOTAL) BY MOUTH DAILY.  30 tablet  3  . dorzolamide-timolol (COSOPT) 22.3-6.8 MG/ML ophthalmic solution Place 1 drop into both eyes 2 (two) times daily.      . finasteride (PROSCAR) 5 MG tablet Take 0.5 tablets (2.5 mg total) by mouth daily. Patient uses for hair growth.  30 tablet  11  . fluticasone (FLONASE) 50 MCG/ACT nasal spray Place 2 sprays into the nose daily.  16 g  11  . losartan (COZAAR) 25 MG tablet Take 1 tablet (25 mg total) by mouth daily.  30 tablet  11  . Polyethyl Glycol-Propyl Glycol (SYSTANE ULTRA) 0.4-0.3 % SOLN Place 1 drop into both eyes 3 (three) times daily as needed. dry eyes.      . pravastatin (PRAVACHOL) 80 MG tablet Take 1 tablet (80 mg total) by mouth daily.  30 tablet  11   No current facility-administered medications for this visit.    Review of  Systems Review of Systems    All other systems reviewed and are negative.  Wt Readings from Last 3 Encounters:  05/31/13 197 lb 6.4 oz (89.54 kg)  04/07/13 195 lb (88.451 kg)  10/05/12 194 lb (87.998 kg)   Temp Readings from Last 3 Encounters:  05/31/13 97 F (36.1 C) Temporal  04/07/13 99.3 F (37.4 C) Oral  06/08/12 97.4 F (36.3 C) Temporal   BP Readings from Last 3 Encounters:  05/31/13 142/86  04/07/13 134/88  10/05/12 138/82   Pulse Readings from Last 3 Encounters:  05/31/13 82  04/07/13 90  10/05/12 90     Physical Exam Physical Exam  Constitutional: She is oriented to person, place, and time. She appears well-developed and well-nourished. No distress. Well dressed.   Head: Normocephalic and atraumatic.  Eyes: Conjunctivae are normal. Pupils are equal, round, and reactive to light. No scleral icterus.  Neck: Normal range of motion. Neck supple. No tracheal deviation present. No thyromegaly present.   Cardiovascular:  intact distal pulses.   Respiratory: Effort normal. No respiratory distress.  Breast:  She exhibits no tenderness. Right breast exhibits no inverted nipple, no mass, no nipple discharge, no skin change and no tenderness. Left breast exhibits no inverted nipple, no mass, no nipple discharge, no skin change and no tenderness.       L breast reconstructed, no masses or tenderness R breast with biopsy scar  GI: Soft. She exhibits no distension and no mass. There is no tenderness. There is no rebound and no guarding.  Musculoskeletal: Normal range of motion. She exhibits no edema and no tenderness.  Lymphadenopathy:    She has no cervical adenopathy. No supraclavicular adenopathy.  Neurological: She is alert and oriented to person, place, and time. Coordination normal.  Skin: Skin is warm and dry. No rash noted. She is not diaphoretic. No erythema. No pallor.  Psychiatric: She has a normal mood and affect. Her behavior is normal. Judgment and thought content normal.     Assessment/Plan History of breast cancer in female, s/p L MRM with reconstruction 1989 w [place] No clinical evidence of disease.  Follow up in 1 year if desired.         Sarah Howell 05/31/2013, 11:43 AM

## 2013-06-16 ENCOUNTER — Other Ambulatory Visit: Payer: Self-pay | Admitting: Family Medicine

## 2013-07-21 ENCOUNTER — Other Ambulatory Visit (INDEPENDENT_AMBULATORY_CARE_PROVIDER_SITE_OTHER): Payer: Medicare Other

## 2013-07-21 DIAGNOSIS — Z23 Encounter for immunization: Secondary | ICD-10-CM

## 2013-10-19 ENCOUNTER — Other Ambulatory Visit: Payer: Self-pay | Admitting: Family Medicine

## 2013-11-24 ENCOUNTER — Encounter: Payer: Self-pay | Admitting: Internal Medicine

## 2013-12-14 ENCOUNTER — Other Ambulatory Visit: Payer: Self-pay | Admitting: Family Medicine

## 2014-01-06 ENCOUNTER — Ambulatory Visit (INDEPENDENT_AMBULATORY_CARE_PROVIDER_SITE_OTHER): Payer: Medicare Other | Admitting: Medical

## 2014-01-06 ENCOUNTER — Emergency Department (HOSPITAL_COMMUNITY): Payer: Medicare Other

## 2014-01-06 ENCOUNTER — Inpatient Hospital Stay (HOSPITAL_COMMUNITY)
Admission: EM | Admit: 2014-01-06 | Discharge: 2014-01-09 | DRG: 153 | Disposition: A | Discharge status: Home / Self Care | Payer: Medicare Other | Attending: Internal Medicine | Admitting: Internal Medicine

## 2014-01-06 ENCOUNTER — Encounter: Payer: Self-pay | Admitting: Medical

## 2014-01-06 ENCOUNTER — Encounter (HOSPITAL_COMMUNITY): Payer: Self-pay | Admitting: Emergency Medicine

## 2014-01-06 VITALS — BP 142/80 | HR 98 | Temp 100.9°F | Resp 16 | Wt 192.0 lb

## 2014-01-06 DIAGNOSIS — Z79899 Other long term (current) drug therapy: Secondary | ICD-10-CM

## 2014-01-06 DIAGNOSIS — R059 Cough, unspecified: Secondary | ICD-10-CM

## 2014-01-06 DIAGNOSIS — Z853 Personal history of malignant neoplasm of breast: Secondary | ICD-10-CM

## 2014-01-06 DIAGNOSIS — I10 Essential (primary) hypertension: Secondary | ICD-10-CM | POA: Diagnosis present

## 2014-01-06 DIAGNOSIS — J189 Pneumonia, unspecified organism: Secondary | ICD-10-CM

## 2014-01-06 DIAGNOSIS — Z9010 Acquired absence of unspecified breast and nipple: Secondary | ICD-10-CM

## 2014-01-06 DIAGNOSIS — E785 Hyperlipidemia, unspecified: Secondary | ICD-10-CM | POA: Diagnosis present

## 2014-01-06 DIAGNOSIS — H409 Unspecified glaucoma: Secondary | ICD-10-CM | POA: Diagnosis present

## 2014-01-06 DIAGNOSIS — R197 Diarrhea, unspecified: Secondary | ICD-10-CM | POA: Clinically undetermined

## 2014-01-06 DIAGNOSIS — R509 Fever, unspecified: Secondary | ICD-10-CM | POA: Diagnosis present

## 2014-01-06 DIAGNOSIS — R51 Headache: Secondary | ICD-10-CM | POA: Diagnosis present

## 2014-01-06 DIAGNOSIS — R5381 Other malaise: Secondary | ICD-10-CM | POA: Diagnosis present

## 2014-01-06 DIAGNOSIS — R05 Cough: Secondary | ICD-10-CM

## 2014-01-06 DIAGNOSIS — Z8669 Personal history of other diseases of the nervous system and sense organs: Secondary | ICD-10-CM

## 2014-01-06 DIAGNOSIS — J069 Acute upper respiratory infection, unspecified: Principal | ICD-10-CM | POA: Diagnosis present

## 2014-01-06 DIAGNOSIS — R0682 Tachypnea, not elsewhere classified: Secondary | ICD-10-CM

## 2014-01-06 DIAGNOSIS — R112 Nausea with vomiting, unspecified: Secondary | ICD-10-CM

## 2014-01-06 DIAGNOSIS — E860 Dehydration: Secondary | ICD-10-CM | POA: Diagnosis present

## 2014-01-06 DIAGNOSIS — D72829 Elevated white blood cell count, unspecified: Secondary | ICD-10-CM | POA: Diagnosis present

## 2014-01-06 DIAGNOSIS — R5383 Other fatigue: Secondary | ICD-10-CM

## 2014-01-06 LAB — CBC WITH DIFFERENTIAL/PLATELET
BASOS ABS: 0 10*3/uL (ref 0.0–0.1)
EOS ABS: 0 10*3/uL (ref 0.0–0.7)
HCT: 37.1 % (ref 36.0–46.0)
Hemoglobin: 12.7 g/dL (ref 12.0–15.0)
LYMPHS ABS: 1.6 10*3/uL (ref 0.7–4.0)
Lymphocytes Relative: 8 % — ABNORMAL LOW (ref 12–46)
MCH: 32.9 pg (ref 26.0–34.0)
MCHC: 34.1 g/dL (ref 30.0–36.0)
MCV: 96.2 fL (ref 78.0–100.0)
Monocytes Absolute: 1 10*3/uL (ref 0.1–1.0)
Monocytes Relative: 5 % (ref 3–12)
Neutro Abs: 17 10*3/uL — ABNORMAL HIGH (ref 1.7–7.7)
Neutrophils Relative %: 87 % — ABNORMAL HIGH (ref 43–77)
PLATELETS: 204 10*3/uL (ref 150–400)
RBC: 3.86 MIL/uL — ABNORMAL LOW (ref 3.87–5.11)
RDW: 13.9 % (ref 11.5–15.5)
WBC: 19.5 10*3/uL — ABNORMAL HIGH (ref 4.0–10.5)

## 2014-01-06 LAB — COMPREHENSIVE METABOLIC PANEL
ALT: 14 U/L (ref 0–35)
AST: 25 U/L (ref 0–37)
Albumin: 3.8 g/dL (ref 3.5–5.2)
Alkaline Phosphatase: 109 U/L (ref 39–117)
BUN: 8 mg/dL (ref 6–23)
CO2: 22 mEq/L (ref 19–32)
Calcium: 9.9 mg/dL (ref 8.4–10.5)
Chloride: 98 mEq/L (ref 96–112)
Creatinine, Ser: 0.91 mg/dL (ref 0.50–1.10)
GFR calc Af Amer: 70 mL/min — ABNORMAL LOW (ref >90)
GFR calc non Af Amer: 61 mL/min — ABNORMAL LOW (ref >90)
Glucose, Bld: 140 mg/dL — ABNORMAL HIGH (ref 70–99)
Potassium: 3.9 mEq/L (ref 3.7–5.3)
Sodium: 136 mEq/L — ABNORMAL LOW (ref 137–147)
Total Bilirubin: 1 mg/dL (ref 0.3–1.2)
Total Protein: 9.8 g/dL — ABNORMAL HIGH (ref 6.0–8.3)

## 2014-01-06 LAB — POCT INFLUENZA A/B
INFLUENZA A, POC: NEGATIVE
INFLUENZA B, POC: NEGATIVE

## 2014-01-06 LAB — TROPONIN I: Troponin I: <0.3 ng/mL (ref <0.30)

## 2014-01-06 MED ORDER — ONDANSETRON HCL 4 MG/2ML IJ SOLN: 4.0000 mg | Freq: Four times a day (QID) | INTRAMUSCULAR | Status: DC | PRN

## 2014-01-06 MED ORDER — ONDANSETRON HCL 4 MG PO TABS: 4.0000 mg | ORAL_TABLET | Freq: Four times a day (QID) | ORAL | Status: DC | PRN

## 2014-01-06 MED ORDER — TRAMADOL HCL 50 MG PO TABS
50.0000 mg | ORAL_TABLET | Freq: Once | ORAL | Status: AC
  Administered 2014-01-06: 50 mg via ORAL
  Filled 2014-01-06: qty 1

## 2014-01-06 MED ORDER — DORZOLAMIDE HCL-TIMOLOL MAL 2-0.5 % OP SOLN
1.0000 [drp] | Freq: Two times a day (BID) | OPHTHALMIC | Status: DC
  Administered 2014-01-06 – 2014-01-08 (×5): 1 [drp] via OPHTHALMIC
  Filled 2014-01-06: qty 10

## 2014-01-06 MED ORDER — LOSARTAN POTASSIUM 25 MG PO TABS
25.0000 mg | ORAL_TABLET | Freq: Every day | ORAL | Status: DC
  Administered 2014-01-07 – 2014-01-08 (×2): 25 mg via ORAL
  Filled 2014-01-06 (×3): qty 1

## 2014-01-06 MED ORDER — ACETAMINOPHEN 325 MG PO TABS
650.0000 mg | ORAL_TABLET | Freq: Four times a day (QID) | ORAL | Status: DC | PRN
  Administered 2014-01-06: 650 mg via ORAL
  Filled 2014-01-06 (×2): qty 2

## 2014-01-06 MED ORDER — CEFTRIAXONE SODIUM 1 G IJ SOLR
1.0000 g | INTRAMUSCULAR | Status: DC
  Administered 2014-01-07: 1 g via INTRAVENOUS
  Filled 2014-01-06 (×2): qty 10

## 2014-01-06 MED ORDER — DEXTROSE 5 % IV SOLN
500.0000 mg | INTRAVENOUS | Status: DC
  Administered 2014-01-07: 500 mg via INTRAVENOUS
  Filled 2014-01-06 (×2): qty 500

## 2014-01-06 MED ORDER — SIMVASTATIN 40 MG PO TABS
40.0000 mg | ORAL_TABLET | Freq: Every day | ORAL | Status: DC
  Administered 2014-01-07 – 2014-01-08 (×2): 40 mg via ORAL
  Filled 2014-01-06 (×3): qty 1

## 2014-01-06 MED ORDER — DEXTROSE 5 % IV SOLN
500.0000 mg | Freq: Once | INTRAVENOUS | Status: AC
  Administered 2014-01-06: 500 mg via INTRAVENOUS

## 2014-01-06 MED ORDER — ACETAMINOPHEN 325 MG PO TABS
650.0000 mg | ORAL_TABLET | Freq: Four times a day (QID) | ORAL | Status: DC | PRN
  Administered 2014-01-06: 650 mg via ORAL
  Filled 2014-01-06: qty 2

## 2014-01-06 MED ORDER — SODIUM CHLORIDE 0.9 % IV SOLN
INTRAVENOUS | Status: AC
  Administered 2014-01-06: 20:00:00 via INTRAVENOUS

## 2014-01-06 MED ORDER — ACETAMINOPHEN 650 MG RE SUPP: 650.0000 mg | Freq: Four times a day (QID) | RECTAL | Status: DC | PRN

## 2014-01-06 MED ORDER — FINASTERIDE 5 MG PO TABS
2.5000 mg | ORAL_TABLET | Freq: Every day | ORAL | Status: DC
  Administered 2014-01-07 – 2014-01-08 (×2): 2.5 mg via ORAL
  Filled 2014-01-06 (×4): qty 0.5

## 2014-01-06 MED ORDER — DEXTROSE 5 % IV SOLN
1.0000 g | INTRAVENOUS | Status: DC
  Administered 2014-01-06: 1 g via INTRAVENOUS
  Filled 2014-01-06: qty 10

## 2014-01-06 MED ORDER — ENOXAPARIN SODIUM 40 MG/0.4ML ~~LOC~~ SOLN
40.0000 mg | SUBCUTANEOUS | Status: DC
  Administered 2014-01-06 – 2014-01-08 (×3): 40 mg via SUBCUTANEOUS
  Filled 2014-01-06 (×4): qty 0.4

## 2014-01-06 MED ORDER — SODIUM CHLORIDE 0.9 % IV SOLN
Freq: Once | INTRAVENOUS | Status: AC
  Administered 2014-01-06: 20 mL/h via INTRAVENOUS

## 2014-01-06 MED ORDER — ALBUTEROL SULFATE (2.5 MG/3ML) 0.083% IN NEBU
5.0000 mg | INHALATION_SOLUTION | Freq: Once | RESPIRATORY_TRACT | Status: AC
  Administered 2014-01-06: 5 mg via RESPIRATORY_TRACT
  Filled 2014-01-06: qty 6

## 2014-01-06 NOTE — ED Provider Notes (Signed)
CSN: [id number]     Arrival date & time 01/06/14  1333 History   First MD Initiated Contact with Patient 01/06/14 1501     Chief Complaint  Patient presents with  . Pneumonia     (Consider location/radiation/quality/duration/timing/severity/associated sxs/prior Treatment) HPI  73 year old female referred from PCPs office for further evaluation. Four to five-day history of generalized malaise, body aches, subjective fever and cough. X-ray in the office which reportedly showed a left lower lobe opacity. She is noted to be febrile and have an oxygen saturation 92% on room air. Flu test was negative. Leukocytosis of 19,000.  Nausea and vomited once shortly after taking alka seltzer last night. No dizziness or lightheadedness. Denies CP.   Past Medical History  Diagnosis Date  . Alopecia   . Osteopenia   . Hyperlipidemia   . Cough   . Allergy   . Glaucoma     bilateral  . Cancer     BREAST  . Shortness of breath     WITH EXERTION   . Hypertension     dr Jeanella Anton   Past Surgical History  Procedure Laterality Date  . Lt breast ca reconstruction  07/28/1988  . Orthoscopic surgery lt knee  2002  . Orthoscopic surgery rt knee  08/05/2009  . Rotoator cuff cleaning    . Breast surgery      lft br mastectomy and reconstruction  . Cataract extraction w/phaco  12/10/2011    Procedure: CATARACT EXTRACTION PHACO AND INTRAOCULAR LENS PLACEMENT (IOC);  Surgeon: Marylynn Pearson, MD;  Location: [place];  Service: Ophthalmology;  Laterality: Right;  . Cataract extraction w/phaco  02/04/2012    Procedure: CATARACT EXTRACTION PHACO AND INTRAOCULAR LENS PLACEMENT (IOC);  Surgeon: Marylynn Pearson, MD;  Location: [place];  Service: Ophthalmology;  Laterality: Left;  Marland Kitchen Eye surgery Bilateral 2013    cataract surgery   Family History  Problem Relation Age of Onset  . Cancer Mother     colon  . Cancer Father     prostate  . Cancer Brother     pancreatic   History  Substance Use Topics  . Smoking  status: Former Research scientist (life sciences)  . Smokeless tobacco: Never Used  . Alcohol Use: No   OB History   Grav Para Term Preterm Abortions TAB SAB Ect Mult Living                 Review of Systems  All systems reviewed and negative, other than as noted in HPI.   Allergies  Fish allergy  Home Medications   Current Outpatient Rx  Name  Route  Sig  Dispense  Refill  . Calcium Carbonate-Vitamin D (CALCIUM + D PO)   Oral   Take 1 tablet by mouth 2 (two) times daily.          . dorzolamide-timolol (COSOPT) 22.3-6.8 MG/ML ophthalmic solution   Both Eyes   Place 1 drop into both eyes 2 (two) times daily.         . finasteride (PROSCAR) 5 MG tablet   Oral   Take 0.5 tablets (2.5 mg total) by mouth daily. Patient uses for hair growth.   30 tablet   11   . fluticasone (FLONASE) 50 MCG/ACT nasal spray   Nasal   Place 2 sprays into the nose daily.   16 g   11   . ibuprofen (ADVIL,MOTRIN) 200 MG tablet   Oral   Take 200 mg by mouth every 6 (six)  hours as needed for moderate pain.          Marland Kitchen losartan (COZAAR) 25 MG tablet   Oral   Take 25 mg by mouth daily.         Vladimir Faster Glycol-Propyl Glycol (SYSTANE ULTRA) 0.4-0.3 % SOLN   Both Eyes   Place 1 drop into both eyes 3 (three) times daily as needed. dry eyes.         . pravastatin (PRAVACHOL) 80 MG tablet   Oral   Take 1 tablet (80 mg total) by mouth daily.   30 tablet   11    BP 163/71  Pulse 99  Temp(Src) 102.9 F (39.4 C) (Oral)  Resp 20  SpO2 95% Physical Exam  Nursing note and vitals reviewed. Constitutional: She appears well-developed and well-nourished. No distress.  Sitting in bed. Appears tired, but not toxic.   HENT:  Head: Normocephalic and atraumatic.  Eyes: Conjunctivae are normal. Right eye exhibits no discharge. Left eye exhibits no discharge.  Neck: Neck supple.  Cardiovascular: Regular rhythm and normal heart sounds.  Exam reveals no gallop and no friction rub.   No murmur heard. Mild  tachycardia  Pulmonary/Chest: Effort normal and breath sounds normal. No respiratory distress.  Breath sounds diminished b/l. No adventitious breath sounds appreciated.   Abdominal: Soft. She exhibits no distension. There is no tenderness.  Musculoskeletal: She exhibits no edema and no tenderness.  Neurological: She is alert.  Skin: Skin is warm and dry. She is not diaphoretic.  Psychiatric: She has a normal mood and affect. Her behavior is normal. Thought content normal.    ED Course  Procedures (including critical care time) Labs Review Labs Reviewed  COMPREHENSIVE METABOLIC PANEL - Abnormal; Notable for the following:    Sodium 136 (*)    Glucose, Bld 140 (*)    Total Protein 9.8 (*)    GFR calc non Af Amer 61 (*)    GFR calc Af Amer 70 (*)    All other components within normal limits  URINALYSIS, ROUTINE W REFLEX MICROSCOPIC   Imaging Review Dg Chest 2 View  01/06/2014   CLINICAL DATA:  Cough and shortness of Breath.  EXAM: CHEST  2 VIEW  COMPARISON:  04/07/2013  FINDINGS: The heart is upper limits of normal and stable. There is tortuosity, ectasia and calcification of the thoracic aorta. The lungs demonstrate chronic bronchitic type interstitial changes but no definite acute overlying pulmonary process. There are surgical changes related to left breast surgery. The bony thorax is grossly intact.  IMPRESSION: Chronic lung changes but no acute pulmonary findings.   Electronically Signed   By: Kalman Jewels M.D.   On: 01/06/2014 15:07     EKG Interpretation None      MDM   Final diagnoses:  CAP (community acquired pneumonia)    57yF with fever, cough and generalized weakness/body aches. CXR in PCP office with reported LLL opacity. I cannot review this XR. XR in ED read by radiology as being w/o acute findings. Her L heart border does appeared somewhat obscured to me though. Clinically this is pneumonia. Fever, cough, leukocytosis, hypoxemia. May be viral illness, but  concerning enough to tx for CAP. Given age and mild hypoxia, will admit.     Virgel Manifold, MD 01/06/14 [phone]

## 2014-01-06 NOTE — Patient Instructions (Addendum)
We are sending you to the emergency department for evaluation and recommended inpatient treatment for pneumonia.    In office xray shows left lower field opacity and questionable bilateral pneumonia.    Pulse ox 92% room air.  Flu test negative.

## 2014-01-06 NOTE — ED Notes (Signed)
Attempted to call report to floor, RN was busy and will call back when available

## 2014-01-06 NOTE — Progress Notes (Signed)
Subjective:  Sarah Howell is a 73 y.o. female who presents for cough.  Symptoms include a day history of illness including cough, fever subjective, headache, sick feeling, chest congestion, some body aches and chills, started vomiting last night for the first time, feels a little short of breath..  The body aches and chills have subsided a little but the newer symptom is of vomiting.  In general doesn't feel very well.   she has had some sick contacts. History of breast cancer but not on any cancer therapy at this time. No other aggravating or relieving factors.  No other c/o.  The following portions of the patient's history were reviewed and updated as appropriate: allergies, current medications, past family history, past medical history, past social history, past surgical history and problem list.  ROS as in subjective  Past Medical History  Diagnosis Date  . Alopecia   . Osteopenia   . Hyperlipidemia   . Cough   . Allergy   . Glaucoma     bilateral  . Cancer     BREAST  . Shortness of breath     WITH EXERTION   . Hypertension     dr Demetrios Isaacs    pcp     Objective: BP 142/80  Pulse 98  Temp(Src) 100.9 F (38.3 C) (Oral)  Resp 16  Wt 192 lb (87.091 kg)  SpO2 92%  General appearance: Alert, WD/WN, no distress, ill appearing                             Skin: warm, no rash, no diaphoresis                           Head: no sinus tenderness                            Eyes: conjunctiva normal, corneas clear, PERRLA                            Ears: pearly TMs, external ear canals normal                          Nose: septum midline, turbinates swollen, with erythema and mucoid discharge             Mouth/throat: MMM, tongue normal, mild pharyngeal erythema                           Neck: supple, no adenopathy, no thyromegaly, nontender                          Heart: RRR, normal S1, S2, no murmurs                         Lungs: +decreased breath sounds, no rhonchi, no wheezes, ?  Rales lower fields                Extremities: no edema, nontender     CXR: patchy opacity left CVA region, patchiness appearing right lower fields as well, no obvious lobar consolidation, but suspicious for pneumonia, left upper chest surgical clips noted  Assessment: Encounter Diagnoses  Name Primary?  . Pneumonia Yes  . Cough   .  Fever, unspecified   . Nausea and vomiting   . Tachypnea       Plan: STAT CBC sent, pulse ox 92% on room air, flu test negative, chest x-ray results reviewed suggestive bilat pneumonia early.   Reviewed case with Dr. Redmond School supervising physician and her PCP here.  Given her exam findings, symptoms, age, we feel that she should probably be admitted for pneumonia.  She will go directly to [facility] Emergency department.

## 2014-01-06 NOTE — H&P (Signed)
[facility] History and Physical  SITARA CASHWELL GDJ:[id number] DOB: 04-01-1941 DOA: 01/06/2014  PCP: Wyatt Haste, MD  Specialists: She is followed by Gen surgery for a remote history of breast cancer. She followed by a dermatologist for alopecia?. Is followed by ophthalmology for glaucoma.  Chief Complaint: Cough, fever for the last 4 days  HPI: Sarah Howell is a 73 y.o. female with a past medical history of glaucoma, hypertension, remote history of breast cancer, who was in her usual state of health till about 4 days ago, when she started noticing cold-like symptoms. Her nose was blocked. She then started having a cough with brownish whitish expectoration. She had fever up to 102F. She had headaches. She took ibuprofen with no relief. And she took Alka-Seltzer and cough drops with no relief. She started getting short of breath. She was coughing so much that her chest was hurting. The chest was hurting on the right side and would increase with coughing. She was not able to quantify the pain She then vomited last night. Denies any abdominal pain. No dysuria. She had loose stool once this morning. She was getting more and more fatigued. She tells me that her grandchild was sick with similar complaints a few days ago but did not have a fever. Patient also had loss of appetite. She went to her primary care physician's office with these complaints and was sent over here due to elevated WBC.  Home Medications: Prior to Admission medications   Medication Sig Start Date End Date Taking? Authorizing Provider  Calcium Carbonate-Vitamin D (CALCIUM + D PO) Take 1 tablet by mouth 2 (two) times daily.    Yes Historical Provider, MD  dorzolamide-timolol (COSOPT) 22.3-6.8 MG/ML ophthalmic solution Place 1 drop into both eyes 2 (two) times daily.   Yes Historical Provider, MD  finasteride (PROSCAR) 5 MG tablet Take 0.5 tablets (2.5 mg total) by mouth daily. Patient uses for hair growth. 10/05/12  Yes  Denita Lung, MD  fluticasone [facility]) 50 MCG/ACT nasal spray Place 2 sprays into the nose daily. 10/05/12  Yes Denita Lung, MD  ibuprofen (ADVIL,MOTRIN) 200 MG tablet Take 200 mg by mouth every 6 (six) hours as needed for moderate pain.    Yes Historical Provider, MD  losartan (COZAAR) 25 MG tablet Take 25 mg by mouth daily.   Yes Historical Provider, MD  Polyethyl Glycol-Propyl Glycol (SYSTANE ULTRA) 0.4-0.3 % SOLN Place 1 drop into both eyes 3 (three) times daily as needed. dry eyes.   Yes Historical Provider, MD  pravastatin (PRAVACHOL) 80 MG tablet Take 1 tablet (80 mg total) by mouth daily. 10/05/12  Yes Denita Lung, MD    Allergies:  Allergies  Allergen Reactions  . Fish Allergy Nausea And Vomiting    Past Medical History: Past Medical History  Diagnosis Date  . Alopecia   . Osteopenia   . Hyperlipidemia   . Cough   . Allergy   . Glaucoma     bilateral  . Cancer     BREAST  . Shortness of breath     WITH EXERTION   . Hypertension     dr Jeanella Anton    Past Surgical History  Procedure Laterality Date  . Lt breast ca reconstruction  07/28/1988  . Orthoscopic surgery lt knee  2002  . Orthoscopic surgery rt knee  08/05/2009  . Rotoator cuff cleaning    . Breast surgery      lft br mastectomy and  reconstruction  . Cataract extraction w/phaco  12/10/2011    Procedure: CATARACT EXTRACTION PHACO AND INTRAOCULAR LENS PLACEMENT (IOC);  Surgeon: Marylynn Pearson, MD;  Location: [place];  Service: Ophthalmology;  Laterality: Right;  . Cataract extraction w/phaco  02/04/2012    Procedure: CATARACT EXTRACTION PHACO AND INTRAOCULAR LENS PLACEMENT (IOC);  Surgeon: Marylynn Pearson, MD;  Location: [place];  Service: Ophthalmology;  Laterality: Left;  Marland Kitchen Eye surgery Bilateral 2013    cataract surgery    Social History: Patient lives in [place]. No smoking, alcohol or illicit drug use. Independent with daily activities usually.   Family History:  Family History  Problem  Relation Age of Onset  . Cancer Mother     colon  . Cancer Father     prostate  . Cancer Brother     pancreatic     Review of Systems - History obtained from the patient General ROS: positive for  - chills, fatigue and fever Psychological ROS: negative Ophthalmic ROS: negative ENT ROS: negative Allergy and Immunology ROS: negative Hematological and Lymphatic ROS: negative Endocrine ROS: negative Respiratory ROS: as in hpi Cardiovascular ROS: as in hpi Gastrointestinal ROS: as in hpi Genito-Urinary ROS: no dysuria, trouble voiding, or hematuria Musculoskeletal ROS: negative Neurological ROS: no TIA or stroke symptoms Dermatological ROS: negative  Physical Examination  Filed Vitals:   01/06/14 1440  BP: 163/71  Pulse: 99  Temp: 102.9 F (39.4 C)  TempSrc: Oral  Resp: 20  SpO2: 95%    BP 163/71  Pulse 99  Temp(Src) 102.9 F (39.4 C) (Oral)  Resp 20  SpO2 95%  General appearance: alert, cooperative, appears stated age, no distress and moderately obese Head: Normocephalic, without obvious abnormality, atraumatic Eyes: conjunctivae/corneas clear. PERRL, EOM's intact.  Throat: lips, mucosa, and tongue normal; teeth and gums normal Neck: no adenopathy, no carotid bruit, no JVD, supple, symmetrical, trachea midline and thyroid not enlarged, symmetric, no tenderness/mass/nodules. Neck is soft and supple. Resp: Coarse breath sounds bilaterally, without wheezing. Crackles bilateral bases. Right more than left. Cardio: regular rate and rhythm, S1, S2 normal, no murmur, click, rub or gallop GI: soft, non-tender; bowel sounds normal; no masses,  no organomegaly Extremities: extremities normal, atraumatic, no cyanosis or edema Pulses: 2+ and symmetric Skin: Skin color, texture, turgor normal. No rashes or lesions Lymph nodes: Cervical, supraclavicular, and axillary nodes normal. Neurologic: No focal deficits.  Laboratory Data: Results for orders placed during the hospital  encounter of 01/06/14 (from the past 48 hour(s))  COMPREHENSIVE METABOLIC PANEL     Status: Abnormal   Collection Time    01/06/14  3:58 PM      Result Value Ref Range   Sodium 136 (*) 137 - 147 mEq/L   Potassium 3.9  3.7 - 5.3 mEq/L   Chloride 98  96 - 112 mEq/L   CO2 22  19 - 32 mEq/L   Glucose, Bld 140 (*) 70 - 99 mg/dL   BUN 8  6 - 23 mg/dL   Creatinine, Ser 0.91  0.50 - 1.10 mg/dL   Calcium 9.9  8.4 - 10.5 mg/dL   Total Protein 9.8 (*) 6.0 - 8.3 g/dL   Albumin 3.8  3.5 - 5.2 g/dL   AST 25  0 - 37 U/L   ALT 14  0 - 35 U/L   Alkaline Phosphatase 109  39 - 117 U/L   Total Bilirubin 1.0  0.3 - 1.2 mg/dL   GFR calc non Af Amer 61 (*) >90 mL/min  GFR calc Af Amer 70 (*) >90 mL/min   Comment: (NOTE)     The eGFR has been calculated using the CKD EPI equation.     This calculation has not been validated in all clinical situations.     eGFR's persistently <90 mL/min signify possible Chronic Kidney     Disease.   WBC was 19.5, with 87% neutrophils. Hemoglobin 12.7. Platelet count 204.  Radiology Reports: Dg Chest 2 View  01/06/2014   CLINICAL DATA:  Cough and shortness of Breath.  EXAM: CHEST  2 VIEW  COMPARISON:  04/07/2013  FINDINGS: The heart is upper limits of normal and stable. There is tortuosity, ectasia and calcification of the thoracic aorta. The lungs demonstrate chronic bronchitic type interstitial changes but no definite acute overlying pulmonary process. There are surgical changes related to left breast surgery. The bony thorax is grossly intact.  IMPRESSION: Chronic lung changes but no acute pulmonary findings.   Electronically Signed   By: Kalman Jewels M.D.   On: 01/06/2014 15:07    Electrocardiogram: Pending  Problem List  Principal Problem:   CAP (community acquired pneumonia) Active Problems:   Hypertension   History of breast cancer in female, s/p L MRM with reconstruction 1989 w [place]   Leukocytosis   Dehydration   Febrile illness, acute    History of glaucoma   Assessment: This is a 73 year old, African American female, with medical history as stated earlier, who presents with four-day history of cough, cold and fever. Chest x-ray does not show any infiltrates, but the patient is dehydrated. Influenza A., and B were negative at her PCP office. She does have a WBC that is elevated. This is most likely upper respiratory tract infection or community acquired pneumonia that is not quite visible on chest x-ray. She does have some crackles at the bases on physical examination.  Plan:  #1 cough with fever. Possible community-acquired pneumonia: She will be treated with ceftriaxone and azithromycin. Respiratory viral panel will be sent. Legionella and strep antigens will be checked. X-ray will be repeated in the morning after she is adequately hydrated. WBC will be monitored closely. Oxygen.  #2 dehydration: She'll be given IV fluids.  #3 minimal chest pain: This is most likely due to cough paroxysms. We will get EKG, and troponin.  #4 history of hypertension: Continue with her antihypertensive regimen.  #5 history of glaucoma: Continue with eye drops.  #6 remote history of breast cancer in 1989: Not an active issue currently   DVT Prophylaxis: Lovenox Code Status: Full code Family Communication: Discussed with the patient and her husband  Disposition Plan: Admit to [place]. She will likely return home when she is better   Further management decisions will depend on results of further testing and patient's response to treatment.  [facility]  [facility] Pager [phone]  If 7PM-7AM, please contact night-coverage [email or web address] Password [facility]  01/06/2014, 6:10 PM

## 2014-01-06 NOTE — ED Notes (Signed)
Pt presents from PCP office after being referred to ED. Note from office states that the patient is being referred to ED for evaluation and recommended inpatient treatment for pneumonia. In office xray shows left lower field opacity and questionable bilateral pneumonia. Pulse ox 92% on room air at office and flu test was negative at the office. Pt is A/O x4 and in NAD at this time. Pt has temp of 102.9 upon arrival to ED.

## 2014-01-07 ENCOUNTER — Inpatient Hospital Stay (HOSPITAL_COMMUNITY): Payer: Medicare Other

## 2014-01-07 DIAGNOSIS — R197 Diarrhea, unspecified: Secondary | ICD-10-CM

## 2014-01-07 DIAGNOSIS — J069 Acute upper respiratory infection, unspecified: Secondary | ICD-10-CM | POA: Diagnosis present

## 2014-01-07 LAB — URINE MICROSCOPIC-ADD ON

## 2014-01-07 LAB — URINALYSIS, ROUTINE W REFLEX MICROSCOPIC
Bilirubin Urine: NEGATIVE
GLUCOSE, UA: NEGATIVE mg/dL
Ketones, ur: NEGATIVE mg/dL
Nitrite: NEGATIVE
PROTEIN: 30 mg/dL — AB
Specific Gravity, Urine: 1.01 (ref 1.005–1.030)
Urobilinogen, UA: 1 mg/dL (ref 0.0–1.0)
pH: 6 (ref 5.0–8.0)

## 2014-01-07 LAB — CBC
HCT: 36.2 % (ref 36.0–46.0)
Hemoglobin: 12.3 g/dL (ref 12.0–15.0)
MCH: 32.1 pg (ref 26.0–34.0)
MCHC: 34 g/dL (ref 30.0–36.0)
MCV: 94.5 fL (ref 78.0–100.0)
PLATELETS: 231 10*3/uL (ref 150–400)
RBC: 3.83 MIL/uL — ABNORMAL LOW (ref 3.87–5.11)
RDW: 14.2 % (ref 11.5–15.5)
WBC: 20.7 10*3/uL — ABNORMAL HIGH (ref 4.0–10.5)

## 2014-01-07 LAB — COMPREHENSIVE METABOLIC PANEL
ALK PHOS: 102 U/L (ref 39–117)
ALT: 11 U/L (ref 0–35)
AST: 23 U/L (ref 0–37)
Albumin: 3 g/dL — ABNORMAL LOW (ref 3.5–5.2)
BILIRUBIN TOTAL: 0.8 mg/dL (ref 0.3–1.2)
BUN: 8 mg/dL (ref 6–23)
CHLORIDE: 104 meq/L (ref 96–112)
CO2: 20 mEq/L (ref 19–32)
Calcium: 8.8 mg/dL (ref 8.4–10.5)
Creatinine, Ser: 0.79 mg/dL (ref 0.50–1.10)
GFR calc Af Amer: >90 mL/min (ref >90)
GFR calc non Af Amer: 80 mL/min — ABNORMAL LOW (ref >90)
Glucose, Bld: 129 mg/dL — ABNORMAL HIGH (ref 70–99)
POTASSIUM: 3.8 meq/L (ref 3.7–5.3)
Sodium: 138 mEq/L (ref 137–147)
Total Protein: 8 g/dL (ref 6.0–8.3)

## 2014-01-07 LAB — STREP PNEUMONIAE URINARY ANTIGEN: STREP PNEUMO URINARY ANTIGEN: NEGATIVE

## 2014-01-07 LAB — HIV ANTIBODY (ROUTINE TESTING W REFLEX): HIV: NONREACTIVE

## 2014-01-07 MED ORDER — SODIUM CHLORIDE 0.9 % IV SOLN
INTRAVENOUS | Status: AC
  Administered 2014-01-07 (×2): via INTRAVENOUS

## 2014-01-07 MED ORDER — GUAIFENESIN 100 MG/5ML PO SOLN
200.0000 mg | ORAL | Status: DC | PRN
  Administered 2014-01-07 – 2014-01-09 (×4): 200 mg via ORAL
  Filled 2014-01-07 (×2): qty 10
  Filled 2014-01-07: qty 20

## 2014-01-07 NOTE — Progress Notes (Signed)
[facility] PROGRESS NOTE  KARLA PAVONE DVV:[id number] DOB: [birth year] DOA: 01/06/2014  PCP: Wyatt Haste, MD  Brief HPI: Sarah Howell is a 73 y.o. female with a past medical history of glaucoma, hypertension, remote history of breast cancer, who presented with cough, fever. She was admitted for treatment of URTI versus pneumonia.   Past medical history:  Past Medical History  Diagnosis Date  . Alopecia   . Osteopenia   . Hyperlipidemia   . Cough   . Allergy   . Glaucoma     bilateral  . Cancer     BREAST  . Shortness of breath     WITH EXERTION   . Hypertension     dr Demetrios Isaacs    pcp    Consultants: None  Procedures: NOne  Antibiotics: Ceftriaxone 3/13--> Azithromycin 3/13-->  Subjective: Patient feels slightly better. Cough and breathing is better. But started having diarrhea last night with loose stools. Denies abdominal pain.  Objective: Vital Signs  Filed Vitals:   01/06/14 1819 01/06/14 1835 01/06/14 2143 01/07/14 0526  BP:  168/82 166/79 153/77  Pulse:  94 91 86  Temp: 99.7 F (37.6 C) 100.5 F (38.1 C) 100.5 F (38.1 C) 99.3 F (37.4 C)  TempSrc: Oral Oral Oral Oral  Resp:  20 22 18   SpO2:  94% 93% 96%    Intake/Output Summary (Last 24 hours) at 01/07/14 6269 Last data filed at 01/07/14 0500  Gross per 24 hour  Intake    950 ml  Output      0 ml  Net    950 ml   There were no vitals filed for this visit.  General appearance: alert, cooperative, appears stated age and no distress Head: Normocephalic, without obvious abnormality, atraumatic Resp: few scattered crackles. No wheezing. No rhonchi. Cardio: regular rate and rhythm, S1, S2 normal, no murmur, click, rub or gallop GI: soft, non-tender; bowel sounds normal; no masses,  no organomegaly Extremities: extremities normal, atraumatic, no cyanosis or edema Neurologic: No focal deficits  Lab Results:  Basic Metabolic Panel:  Recent Labs Lab 01/06/14 1558 01/07/14 0515    NA 136* 138  K 3.9 3.8  CL 98 104  CO2 22 20  GLUCOSE 140* 129*  BUN 8 8  CREATININE 0.91 0.79  CALCIUM 9.9 8.8   Liver Function Tests:  Recent Labs Lab 01/06/14 1558 01/07/14 0515  AST 25 23  ALT 14 11  ALKPHOS 109 102  BILITOT 1.0 0.8  PROT 9.8* 8.0  ALBUMIN 3.8 3.0*   CBC:  Recent Labs Lab 01/06/14 1231 01/07/14 0515  WBC 19.5* 20.7*  NEUTROABS 17.0*  --   HGB 12.7 12.3  HCT 37.1 36.2  MCV 96.2 94.5  PLT 204 231   Cardiac Enzymes:  Recent Labs Lab 01/06/14 2152  TROPONINI <0.30    Studies/Results: Dg Chest 2 View  01/07/2014   CLINICAL DATA:  Cough  EXAM: CHEST  2 VIEW  COMPARISON:  Prior chest x-ray 01/06/2014 ; 04/07/2013  FINDINGS: Stable cardiac and mediastinal contours. Similar appearance of bronchitic changes, diffuse interstitial prominence, upper lung predominant emphysematous change and bibasilar interstitial prominence. No definite focal airspace consolidation, pneumothorax or pleural effusion. Surgical clips noted in the left axilla. No acute osseous abnormality.  IMPRESSION: Stable appearance of the chest.  No significant interval change.   Electronically Signed   By: Jacqulynn Cadet M.D.   On: 01/07/2014 08:55   Dg Chest 2 View  01/06/2014   CLINICAL DATA:  Cough and shortness of Breath.  EXAM: CHEST  2 VIEW  COMPARISON:  04/07/2013  FINDINGS: The heart is upper limits of normal and stable. There is tortuosity, ectasia and calcification of the thoracic aorta. The lungs demonstrate chronic bronchitic type interstitial changes but no definite acute overlying pulmonary process. There are surgical changes related to left breast surgery. The bony thorax is grossly intact.  IMPRESSION: Chronic lung changes but no acute pulmonary findings.   Electronically Signed   By: Kalman Jewels M.D.   On: 01/06/2014 15:07    Medications:  Scheduled: . azithromycin  500 mg Intravenous Q24H  . cefTRIAXone (ROCEPHIN)  IV  1 g Intravenous Q24H  .  dorzolamide-timolol  1 drop Both Eyes BID  . enoxaparin (LOVENOX) injection  40 mg Subcutaneous Q24H  . finasteride  2.5 mg Oral Daily  . losartan  25 mg Oral Daily  . simvastatin  40 mg Oral q1800   Continuous: . sodium chloride     RJJ:[id number], acetaminophen, guaifenesin, ondansetron (ZOFRAN) IV, ondansetron  Assessment/Plan:  Principal Problem:   CAP (community acquired pneumonia) Active Problems:   Hypertension   History of breast cancer in female, s/p L MRM with reconstruction 1989 w [facility]   Leukocytosis   Dehydration   Febrile illness, acute   History of glaucoma    URTI Patient seems to be improving. Repeat CXR also did not reveal any infiltrates. Continue antibiotics. WBC remains high. Check daily for now. Resp viral panel is pending. Oxygen as needed.  Diarrhea Will check for c diff due to significantly elevated WBC.  Dehydration Continue IV fluids for now.  Minimal chest pain Improved. This was most likely due to cough paroxysms. EKG is non ischemic and troponin was normal.   History of hypertension BP not well controlled. Continue with her antihypertensive regimen. Adjust medications if no change by tomorrow.  History of glaucoma Continue with eye drops.   Remote history of breast cancer in 1989 Not an active issue currently   DVT Prophylaxis: Lovenox  Code Status: Full code  Family Communication: Discussed with the patient and her husband  Disposition Plan: She will likely return home when she is better     LOS: 1 day   [facility] Pager [phone] 01/07/2014, 9:29 AM  If 8PM-8AM, please contact night-coverage at [email or web address], password [facility]

## 2014-01-08 LAB — CBC
HCT: 36.5 % (ref 36.0–46.0)
Hemoglobin: 12.2 g/dL (ref 12.0–15.0)
MCH: 31.1 pg (ref 26.0–34.0)
MCHC: 33.4 g/dL (ref 30.0–36.0)
MCV: 93.1 fL (ref 78.0–100.0)
Platelets: 214 10*3/uL (ref 150–400)
RBC: 3.92 MIL/uL (ref 3.87–5.11)
RDW: 14.1 % (ref 11.5–15.5)
WBC: 15.3 10*3/uL — ABNORMAL HIGH (ref 4.0–10.5)

## 2014-01-08 LAB — BASIC METABOLIC PANEL
BUN: 6 mg/dL (ref 6–23)
CO2: 21 mEq/L (ref 19–32)
Calcium: 8.8 mg/dL (ref 8.4–10.5)
Chloride: 104 mEq/L (ref 96–112)
Creatinine, Ser: 0.76 mg/dL (ref 0.50–1.10)
GFR, EST NON AFRICAN AMERICAN: 81 mL/min — AB (ref >90)
Glucose, Bld: 119 mg/dL — ABNORMAL HIGH (ref 70–99)
POTASSIUM: 3.4 meq/L — AB (ref 3.7–5.3)
SODIUM: 138 meq/L (ref 137–147)

## 2014-01-08 LAB — RESPIRATORY VIRUS PANEL
Adenovirus: NOT DETECTED
INFLUENZA B 1: NOT DETECTED
Influenza A H1: NOT DETECTED
Influenza A H3: NOT DETECTED
Influenza A: NOT DETECTED
METAPNEUMOVIRUS: NOT DETECTED
PARAINFLUENZA 1 A: NOT DETECTED
Parainfluenza 2: NOT DETECTED
Parainfluenza 3: NOT DETECTED
Respiratory Syncytial Virus A: NOT DETECTED
Respiratory Syncytial Virus B: NOT DETECTED
Rhinovirus: NOT DETECTED

## 2014-01-08 LAB — LEGIONELLA ANTIGEN, URINE: Legionella Antigen, Urine: NEGATIVE

## 2014-01-08 LAB — CLOSTRIDIUM DIFFICILE BY PCR: Toxigenic C. Difficile by PCR: NEGATIVE

## 2014-01-08 MED ORDER — LOPERAMIDE HCL 2 MG PO CAPS
4.0000 mg | ORAL_CAPSULE | Freq: Once | ORAL | Status: AC
  Administered 2014-01-08: 4 mg via ORAL
  Filled 2014-01-08: qty 2

## 2014-01-08 MED ORDER — LOPERAMIDE HCL 2 MG PO CAPS: 2.0000 mg | ORAL_CAPSULE | Freq: Three times a day (TID) | ORAL | Status: DC | PRN

## 2014-01-08 MED ORDER — LEVOFLOXACIN 500 MG PO TABS
500.0000 mg | ORAL_TABLET | Freq: Every day | ORAL | Status: DC
  Administered 2014-01-08: 500 mg via ORAL
  Filled 2014-01-08 (×3): qty 1

## 2014-01-08 MED ORDER — POTASSIUM CHLORIDE CRYS ER 20 MEQ PO TBCR
40.0000 meq | EXTENDED_RELEASE_TABLET | Freq: Once | ORAL | Status: AC
  Administered 2014-01-08: 40 meq via ORAL
  Filled 2014-01-08: qty 2

## 2014-01-08 NOTE — Progress Notes (Signed)
[facility] PROGRESS NOTE  Sarah Howell WFU:[id number] DOB: [birth year] DOA: 01/06/2014  PCP: Wyatt Haste, MD  Brief HPI: Sarah Howell is a 73 y.o. female with a past medical history of glaucoma, hypertension, remote history of breast cancer, who presented with cough, fever. She was admitted for treatment of URTI versus pneumonia.   Past medical history:  Past Medical History  Diagnosis Date  . Alopecia   . Osteopenia   . Hyperlipidemia   . Cough   . Allergy   . Glaucoma     bilateral  . Cancer     BREAST  . Shortness of breath     WITH EXERTION   . Hypertension     dr Demetrios Isaacs    pcp    Consultants: None  Procedures: None  Antibiotics: Ceftriaxone 3/13-->3/15 Azithromycin 3/13-->3/15 Levaquin 3/15-->  Subjective: Patient feels better. Cough is better. Diarrhea persists but less in amount though still watery. Denies nausea/vomiting/abdominal pain.  Objective: Vital Signs  Filed Vitals:   01/07/14 0526 01/07/14 1430 01/07/14 2151 01/08/14 0656  BP: 153/77 161/74 147/74 146/66  Pulse: 86 89 85 81  Temp: 99.3 F (37.4 C) 99.1 F (37.3 C) 99.2 F (37.3 C) 98.8 F (37.1 C)  TempSrc: Oral Oral Oral Oral  Resp: 18 18 18 18   SpO2: 96% 95% 92% 94%    Intake/Output Summary (Last 24 hours) at 01/08/14 1004 Last data filed at 01/08/14 0942  Gross per 24 hour  Intake    360 ml  Output   1000 ml  Net   -640 ml   There were no vitals filed for this visit.  General appearance: alert, cooperative, appears stated age and no distress Resp: Improved air entry. few scattered crackles. No wheezing. No rhonchi. Cardio: regular rate and rhythm, S1, S2 normal, no murmur, click, rub or gallop GI: soft, non-tender; bowel sounds normal; no masses,  no organomegaly Extremities: extremities normal, atraumatic, no cyanosis or edema Neurologic: No focal deficits  Lab Results:  Basic Metabolic Panel:  Recent Labs Lab 01/06/14 1558 01/07/14 0515  01/08/14 0521  NA 136* 138 138  K 3.9 3.8 3.4*  CL 98 104 104  CO2 22 20 21   GLUCOSE 140* 129* 119*  BUN 8 8 6   CREATININE 0.91 0.79 0.76  CALCIUM 9.9 8.8 8.8   Liver Function Tests:  Recent Labs Lab 01/06/14 1558 01/07/14 0515  AST 25 23  ALT 14 11  ALKPHOS 109 102  BILITOT 1.0 0.8  PROT 9.8* 8.0  ALBUMIN 3.8 3.0*   CBC:  Recent Labs Lab 01/06/14 1231 01/07/14 0515 01/08/14 0521  WBC 19.5* 20.7* 15.3*  NEUTROABS 17.0*  --   --   HGB 12.7 12.3 12.2  HCT 37.1 36.2 36.5  MCV 96.2 94.5 93.1  PLT 204 231 214   Cardiac Enzymes:  Recent Labs Lab 01/06/14 2152  TROPONINI <0.30    Studies/Results: Dg Chest 2 View  01/07/2014   CLINICAL DATA:  Cough  EXAM: CHEST  2 VIEW  COMPARISON:  Prior chest x-ray 01/06/2014 ; 04/07/2013  FINDINGS: Stable cardiac and mediastinal contours. Similar appearance of bronchitic changes, diffuse interstitial prominence, upper lung predominant emphysematous change and bibasilar interstitial prominence. No definite focal airspace consolidation, pneumothorax or pleural effusion. Surgical clips noted in the left axilla. No acute osseous abnormality.  IMPRESSION: Stable appearance of the chest.  No significant interval change.   Electronically Signed   By: Jacqulynn Cadet M.D.   On: 01/07/2014 08:55  Dg Chest 2 View  01/06/2014   CLINICAL DATA:  Cough and shortness of Breath.  EXAM: CHEST  2 VIEW  COMPARISON:  04/07/2013  FINDINGS: The heart is upper limits of normal and stable. There is tortuosity, ectasia and calcification of the thoracic aorta. The lungs demonstrate chronic bronchitic type interstitial changes but no definite acute overlying pulmonary process. There are surgical changes related to left breast surgery. The bony thorax is grossly intact.  IMPRESSION: Chronic lung changes but no acute pulmonary findings.   Electronically Signed   By: Kalman Jewels M.D.   On: 01/06/2014 15:07    Medications:  Scheduled: . dorzolamide-timolol   1 drop Both Eyes BID  . enoxaparin (LOVENOX) injection  40 mg Subcutaneous Q24H  . finasteride  2.5 mg Oral Daily  . levofloxacin  500 mg Oral Daily  . losartan  25 mg Oral Daily  . simvastatin  40 mg Oral q1800   Continuous:   KGM:[id number], acetaminophen, guaiFENesin, ondansetron (ZOFRAN) IV, ondansetron  Assessment/Plan:  Principal Problem:   URTI (acute upper respiratory infection) Active Problems:   Hypertension   History of breast cancer in female, s/p L MRM with reconstruction 1989 w [facility]   Leukocytosis   Dehydration   Febrile illness, acute   History of glaucoma   Acute diarrhea    URTI Patient seems to be improving. Repeat CXR also did not reveal any infiltrates. Continue antibiotics but will change to oral. WBC is improving. Check daily for now. Resp viral panel is negative. Discontinue droplet precautions. Oxygen as needed.  Diarrhea C diff is pending. Monitor.   Dehydration Resolved. Stop IVF.   Minimal chest pain Improved. This was most likely due to cough paroxysms. EKG was non ischemic and troponin was normal.   History of hypertension BP improved. Continue with her antihypertensive regimen.   History of glaucoma Continue with eye drops.   Remote history of breast cancer in 1989 Not an active issue currently   DVT Prophylaxis: Lovenox  Code Status: Full code  Family Communication: Discussed with the patient and her husband  Disposition Plan: She will likely return home when she is better. Anticipate discharge in 1-2 days.    LOS: 2 days   [facility] Pager [phone] 01/08/2014, 10:04 AM  If 8PM-8AM, please contact night-coverage at [email or web address], password [facility]

## 2014-01-09 ENCOUNTER — Ambulatory Visit: Payer: Medicare Other | Admitting: Medical

## 2014-01-09 MED ORDER — GUAIFENESIN 100 MG/5ML PO SOLN: 200.0000 mg | ORAL | Status: DC | PRN

## 2014-01-09 MED ORDER — LEVOFLOXACIN 500 MG PO TABS: 500.0000 mg | ORAL_TABLET | Freq: Every day | ORAL | Status: DC

## 2014-01-09 MED ORDER — LOPERAMIDE HCL 2 MG PO CAPS: 2.0000 mg | ORAL_CAPSULE | Freq: Three times a day (TID) | ORAL | Status: DC | PRN

## 2014-01-09 NOTE — Discharge Instructions (Signed)

## 2014-01-09 NOTE — Discharge Summary (Signed)
[facility]  Physician Discharge Summary   Patient ID: Sarah Howell MRN: [id number] DOB/AGE: 1941-07-10 73 y.o.  Admit date: 01/06/2014 Discharge date: 01/09/2014  PCP: Wyatt Haste, MD  DISCHARGE DIAGNOSES:  Principal Problem:   URTI (acute upper respiratory infection) Active Problems:   Hypertension   History of breast cancer in female, s/p L MRM with reconstruction 1989 w [place]   Leukocytosis   Dehydration   Febrile illness, acute   History of glaucoma   Acute diarrhea   RECOMMENDATIONS FOR OUTPATIENT FOLLOW UP: 1. Needs close follow up with PCP  DISCHARGE CONDITION: fair  Diet recommendation: Low Sodium  INITIAL HISTORY: Sarah Howell is a 72 y.o. female with a past medical history of glaucoma, hypertension, remote history of breast cancer, who presented with cough, fever. She was admitted for treatment of URTI versus pneumonia.  Consultations:  None  Procedures:  None  HOSPITAL COURSE:   Upper Respiratory Tract Infection  Patient was admitted and started on antibiotics. Repeat CXR, after hydration, did not reveal any pneumonia either. Patient has been improving. Antibiotics were changed to oral. WBC is improving. Resp viral panel including Influenza is negative. She is saturating well on room air. Ambulating as well.  Diarrhea  This has resolved with imodium. C diff was negative. This was most likely due to acute infection or antibiotics.   Dehydration  Resolved with IV Fluids.   Minimal chest pain at admission No further episodes. This was most likely due to cough paroxysms. EKG was non ischemic and troponin was normal.   History of hypertension  BP stable. Continue with her antihypertensive regimen.   History of glaucoma  Continue with eye drops.   Remote history of breast cancer in 1989  Not an active issue currently   Cough is improved. Patient is feeling better and ready to go home. Stable for  discharge.   PERTINENT LABS:  The results of significant diagnostics from this hospitalization (including imaging, microbiology, ancillary and laboratory) are listed below for reference.    Microbiology: Recent Results (from the past 240 hour(s))  RESPIRATORY VIRUS PANEL     Status: None   Collection Time    01/07/14  6:47 AM      Result Value Ref Range Status   Source - RVPAN NOSE   Final   Respiratory Syncytial Virus A NOT DETECTED   Final   Respiratory Syncytial Virus B NOT DETECTED   Final   Influenza A NOT DETECTED   Final   Influenza B NOT DETECTED   Final   Parainfluenza 1 NOT DETECTED   Final   Parainfluenza 2 NOT DETECTED   Final   Parainfluenza 3 NOT DETECTED   Final   Metapneumovirus NOT DETECTED   Final   Rhinovirus NOT DETECTED   Final   Adenovirus NOT DETECTED   Final   Influenza A H1 NOT DETECTED   Final   Influenza A H3 NOT DETECTED   Final   Comment: (NOTE)           Normal Reference Range for each Analyte: NOT DETECTED     Testing performed using the Luminex xTAG Respiratory Viral Panel test     kit.     This test was developed and its performance characteristics determined     by [employer]. It has not been cleared or approved by the [place]     Food and Drug Administration. This test is used for clinical purposes.     It  should not be regarded as investigational or for research. This     laboratory is certified under the [place] (CLIA) as qualified to perform high complexity     clinical laboratory testing.     Performed at [facility] DIFFICILE BY PCR     Status: None   Collection Time    01/08/14  4:03 AM      Result Value Ref Range Status   C difficile by pcr NEGATIVE  NEGATIVE Final   Comment: Performed at [facility]: Basic Metabolic Panel:  Recent Labs Lab 01/06/14 1558 01/07/14 0515 01/08/14 0521  NA 136* 138 138  K 3.9 3.8 3.4*  CL 98 104 104   CO2 '22 20 21  ' GLUCOSE 140* 129* 119*  BUN '8 8 6  ' CREATININE 0.91 0.79 0.76  CALCIUM 9.9 8.8 8.8   Liver Function Tests:  Recent Labs Lab 01/06/14 1558 01/07/14 0515  AST 25 23  ALT 14 11  ALKPHOS 109 102  BILITOT 1.0 0.8  PROT 9.8* 8.0  ALBUMIN 3.8 3.0*   CBC:  Recent Labs Lab 01/06/14 1231 01/07/14 0515 01/08/14 0521  WBC 19.5* 20.7* 15.3*  NEUTROABS 17.0*  --   --   HGB 12.7 12.3 12.2  HCT 37.1 36.2 36.5  MCV 96.2 94.5 93.1  PLT 204 231 214   Cardiac Enzymes:  Recent Labs Lab 01/06/14 2152  TROPONINI <0.30    IMAGING STUDIES Dg Chest 2 View  01/07/2014   CLINICAL DATA:  Cough  EXAM: CHEST  2 VIEW  COMPARISON:  Prior chest x-ray 01/06/2014 ; 04/07/2013  FINDINGS: Stable cardiac and mediastinal contours. Similar appearance of bronchitic changes, diffuse interstitial prominence, upper lung predominant emphysematous change and bibasilar interstitial prominence. No definite focal airspace consolidation, pneumothorax or pleural effusion. Surgical clips noted in the left axilla. No acute osseous abnormality.  IMPRESSION: Stable appearance of the chest.  No significant interval change.   Electronically Signed   By: Jacqulynn Cadet M.D.   On: 01/07/2014 08:55   Dg Chest 2 View  01/06/2014   CLINICAL DATA:  Cough and shortness of Breath.  EXAM: CHEST  2 VIEW  COMPARISON:  04/07/2013  FINDINGS: The heart is upper limits of normal and stable. There is tortuosity, ectasia and calcification of the thoracic aorta. The lungs demonstrate chronic bronchitic type interstitial changes but no definite acute overlying pulmonary process. There are surgical changes related to left breast surgery. The bony thorax is grossly intact.  IMPRESSION: Chronic lung changes but no acute pulmonary findings.   Electronically Signed   By: Kalman Jewels M.D.   On: 01/06/2014 15:07    DISCHARGE EXAMINATION: Filed Vitals:   01/08/14 1505 01/08/14 2110 01/09/14 0520 01/09/14 0921  BP: 167/72  133/92 174/84 143/76  Pulse: 95 102 92   Temp: 98.9 F (37.2 C) 99.2 F (37.3 C) 99.6 F (37.6 C)   TempSrc: Oral Oral Oral   Resp: '18 18 18   ' SpO2: 98% 95% 94%    General appearance: alert, cooperative, appears stated age and no distress Resp: few scattered crackles. No wheezing. Cardio: regular rate and rhythm, S1, S2 normal, no murmur, click, rub or gallop GI: soft, non-tender; bowel sounds normal; no masses,  no organomegaly Neurologic: Alert and oriented X 3, normal strength and tone. Normal symmetric reflexes. Normal coordination and gait  DISPOSITION: Home  Discharge Orders   Future Appointments  Provider Department Dept Phone   02/01/2014 9:45 AM Denita Lung, MD [place] [phone]   Future Orders Complete By Expires   Call MD for:  difficulty breathing, headache or visual disturbances  As directed    Call MD for:  extreme fatigue  As directed    Diet - low sodium heart healthy  As directed    Discharge instructions  As directed    Comments:     Please follow up with your PCP by end of week.   Increase activity slowly  As directed       ALLERGIES:  Allergies  Allergen Reactions  . Fish Allergy Nausea And Vomiting    Current Discharge Medication List    START taking these medications   Details  guaiFENesin (ROBITUSSIN) 100 MG/5ML SOLN Take 10 mLs (200 mg total) by mouth every 4 (four) hours as needed. Qty: 240 mL, Refills: 0    levofloxacin (LEVAQUIN) 500 MG tablet Take 1 tablet (500 mg total) by mouth daily. For 5 more days Qty: 5 tablet, Refills: 0    loperamide (IMODIUM) 2 MG capsule Take 1 capsule (2 mg total) by mouth 3 (three) times daily as needed for diarrhea or loose stools. Qty: 15 capsule, Refills: 0      CONTINUE these medications which have NOT CHANGED   Details  Calcium Carbonate-Vitamin D (CALCIUM + D PO) Take 1 tablet by mouth 2 (two) times daily.     dorzolamide-timolol (COSOPT) 22.3-6.8 MG/ML ophthalmic solution Place  1 drop into both eyes 2 (two) times daily.    finasteride (PROSCAR) 5 MG tablet Take 0.5 tablets (2.5 mg total) by mouth daily. Patient uses for hair growth. Qty: 30 tablet, Refills: 11   Associated Diagnoses: Alopecia    fluticasone (FLONASE) 50 MCG/ACT nasal spray Place 2 sprays into the nose daily. Qty: 16 g, Refills: 11   Associated Diagnoses: Allergic rhinitis, mild    ibuprofen (ADVIL,MOTRIN) 200 MG tablet Take 200 mg by mouth every 6 (six) hours as needed for moderate pain.     losartan (COZAAR) 25 MG tablet Take 25 mg by mouth daily.    Polyethyl Glycol-Propyl Glycol (SYSTANE ULTRA) 0.4-0.3 % SOLN Place 1 drop into both eyes 3 (three) times daily as needed. dry eyes.    pravastatin (PRAVACHOL) 80 MG tablet Take 1 tablet (80 mg total) by mouth daily. Qty: 30 tablet, Refills: 11   Associated Diagnoses: Hyperlipidemia LDL goal <100       Follow-up Information   Follow up with Wyatt Haste, MD. Schedule an appointment as soon as possible for a visit in 4 days. (post hospitalization follow up)    Specialty:  Family Medicine   Contact information:   [place] [address] [phone]       TOTAL DISCHARGE TIME: 80 mins  [facility] Pager [phone]  01/09/2014, 11:23 AM

## 2014-01-12 ENCOUNTER — Encounter: Payer: Self-pay | Admitting: Family Medicine

## 2014-01-12 ENCOUNTER — Ambulatory Visit (INDEPENDENT_AMBULATORY_CARE_PROVIDER_SITE_OTHER): Payer: Medicare Other | Admitting: Family Medicine

## 2014-01-12 VITALS — BP 150/90 | HR 60 | Wt 196.0 lb

## 2014-01-12 DIAGNOSIS — D72829 Elevated white blood cell count, unspecified: Secondary | ICD-10-CM

## 2014-01-12 DIAGNOSIS — J069 Acute upper respiratory infection, unspecified: Secondary | ICD-10-CM

## 2014-01-12 DIAGNOSIS — E785 Hyperlipidemia, unspecified: Secondary | ICD-10-CM

## 2014-01-12 LAB — CBC WITH DIFFERENTIAL/PLATELET
BASOS PCT: 0 % (ref 0–1)
Basophils Absolute: 0 10*3/uL (ref 0.0–0.1)
EOS ABS: 0.2 10*3/uL (ref 0.0–0.7)
Eosinophils Relative: 2 % (ref 0–5)
HCT: 37.5 % (ref 36.0–46.0)
HEMOGLOBIN: 12.5 g/dL (ref 12.0–15.0)
Lymphocytes Relative: 30 % (ref 12–46)
Lymphs Abs: 2.8 10*3/uL (ref 0.7–4.0)
MCH: 30.8 pg (ref 26.0–34.0)
MCHC: 33.3 g/dL (ref 30.0–36.0)
MCV: 92.4 fL (ref 78.0–100.0)
MONOS PCT: 11 % (ref 3–12)
Monocytes Absolute: 1 10*3/uL (ref 0.1–1.0)
Neutro Abs: 5.4 10*3/uL (ref 1.7–7.7)
Neutrophils Relative %: 57 % (ref 43–77)
Platelets: 359 10*3/uL (ref 150–400)
RBC: 4.06 MIL/uL (ref 3.87–5.11)
RDW: 15 % (ref 11.5–15.5)
WBC: 9.4 10*3/uL (ref 4.0–10.5)

## 2014-01-12 NOTE — Progress Notes (Signed)
   Subjective:    Patient ID: Sarah Howell, female    DOB: [birth year], 73 y.o.   MRN: [id number]  HPI She is here for posthospitalization followup she was admitted and treated for presumed pneumonia although the x-ray was negative. She is doing much better now. She has no fever, cough, congestion or fatigue. She also needs a refill on her statin. Her last blood draw for that was in 2013th. Review of Systems     Objective:   Physical Exam Alert and in no distress. Cardiac exam shows regular rhythm without murmurs or gallops. Lungs are clear to auscultation. The hospital emergency room and discharge summary records were reviewed.     Assessment & Plan:  Hyperlipidemia LDL goal <100 - Plan: Lipid panel  Leukocytosis - Plan: CBC with Differential  URTI (acute upper respiratory infection)

## 2014-01-13 LAB — LIPID PANEL
CHOLESTEROL: 140 mg/dL (ref 0–200)
HDL: 28 mg/dL — ABNORMAL LOW (ref >39)
LDL Cholesterol: 89 mg/dL (ref 0–99)
Total CHOL/HDL Ratio: 5 Ratio
Triglycerides: 114 mg/dL (ref <150)
VLDL: 23 mg/dL (ref 0–40)

## 2014-01-13 MED ORDER — PRAVASTATIN SODIUM 80 MG PO TABS: 80.0000 mg | ORAL_TABLET | Freq: Every day | ORAL | Status: DC

## 2014-01-13 NOTE — Addendum Note (Signed)
Addended by: Denita Lung on: 01/13/2014 08:29 AM   Modules accepted: Orders

## 2014-02-01 ENCOUNTER — Encounter: Payer: Medicare Other | Admitting: Family Medicine

## 2014-02-20 ENCOUNTER — Other Ambulatory Visit: Payer: Self-pay

## 2014-02-20 DIAGNOSIS — Z1231 Encounter for screening mammogram for malignant neoplasm of breast: Secondary | ICD-10-CM

## 2014-02-28 ENCOUNTER — Telehealth: Payer: Self-pay | Admitting: Internal Medicine

## 2014-02-28 MED ORDER — LOSARTAN POTASSIUM 25 MG PO TABS
25.0000 mg | ORAL_TABLET | Freq: Every day | ORAL | Status: DC
Start: 2014-02-28 — End: 2014-06-14

## 2014-02-28 NOTE — Telephone Encounter (Signed)
Request for a 90 day supply for losartan 25mg  #90 to [facility] [place]

## 2014-02-28 NOTE — Telephone Encounter (Signed)
Medication sent in. 

## 2014-03-23 ENCOUNTER — Other Ambulatory Visit: Payer: Self-pay

## 2014-03-23 ENCOUNTER — Ambulatory Visit
Admission: RE | Admit: 2014-03-23 | Discharge: 2014-03-23 | Disposition: A | Discharge status: Home / Self Care | Payer: 59 | Source: Ambulatory Visit

## 2014-03-23 DIAGNOSIS — Z1231 Encounter for screening mammogram for malignant neoplasm of breast: Secondary | ICD-10-CM

## 2014-03-23 DIAGNOSIS — Z9012 Acquired absence of left breast and nipple: Secondary | ICD-10-CM

## 2014-03-23 LAB — HM MAMMOGRAPHY: HM Mammogram: NORMAL

## 2014-06-14 ENCOUNTER — Other Ambulatory Visit: Payer: Self-pay | Admitting: Family Medicine

## 2014-06-16 ENCOUNTER — Ambulatory Visit (INDEPENDENT_AMBULATORY_CARE_PROVIDER_SITE_OTHER): Payer: Medicare Other | Admitting: General Surgery

## 2014-06-16 VITALS — BP 150/96 | HR 94 | Temp 98.0°F | Ht 59.0 in | Wt 198.0 lb

## 2014-06-16 DIAGNOSIS — Z853 Personal history of malignant neoplasm of breast: Secondary | ICD-10-CM

## 2014-06-16 NOTE — Assessment & Plan Note (Signed)
No clinical evidence of disease.  Annual mammogram Follow up in 1 year.

## 2014-06-16 NOTE — Patient Instructions (Signed)
No evidence of cancer.  Get mammogram next may.  Follow up in 1 year.

## 2014-06-16 NOTE — Progress Notes (Signed)
Sarah Howell is a 73 y.o. female.    Chief Complaint  Patient presents with  . Breast Cancer Long Term Follow Up    HPI Patient is a 73 year old female who had breast cancer in 1989. She had mastectomy and reconstruction.  She has kept up to date with her health maintenance. Pt complains of moisture under her right breast.  She also complains about her weight.  She has had a good summer with her grandkids and a trip to the beach.    Past Medical History  Diagnosis Date  . Alopecia   . Osteopenia   . Hyperlipidemia   . Cough   . Allergy   . Glaucoma     bilateral  . Cancer     BREAST  . Shortness of breath     WITH EXERTION   . Hypertension     dr Jeanella Anton    Past Surgical History  Procedure Laterality Date  . Lt breast ca reconstruction  07/28/1988  . Orthoscopic surgery lt knee  2002  . Orthoscopic surgery rt knee  08/05/2009  . Rotoator cuff cleaning    . Breast surgery      lft br mastectomy and reconstruction  . Cataract extraction w/phaco  12/10/2011    Procedure: CATARACT EXTRACTION PHACO AND INTRAOCULAR LENS PLACEMENT (IOC);  Surgeon: Marylynn Pearson, MD;  Location: [place];  Service: Ophthalmology;  Laterality: Right;  . Cataract extraction w/phaco  02/04/2012    Procedure: CATARACT EXTRACTION PHACO AND INTRAOCULAR LENS PLACEMENT (IOC);  Surgeon: Marylynn Pearson, MD;  Location: [place];  Service: Ophthalmology;  Laterality: Left;  [facility] Eye surgery Bilateral 2013    cataract surgery    Family History  Problem Relation Age of Onset  . Cancer Mother     colon  . Cancer Father     prostate  . Cancer Brother     pancreatic    Social History History  Substance Use Topics  . Smoking status: Former Research scientist (life sciences)  . Smokeless tobacco: Never Used  . Alcohol Use: No    Allergies  Allergen Reactions  . Fish Allergy Nausea And Vomiting    Current Outpatient Prescriptions  Medication Sig Dispense Refill  . Calcium Carbonate-Vitamin D (CALCIUM + D PO) Take 1 tablet by  mouth 2 (two) times daily.       . dorzolamide-timolol (COSOPT) 22.3-6.8 MG/ML ophthalmic solution Place 1 drop into both eyes 2 (two) times daily.      . finasteride (PROSCAR) 5 MG tablet Take 0.5 tablets (2.5 mg total) by mouth daily. Patient uses for hair growth.  30 tablet  11  . fluticasone (FLONASE) 50 MCG/ACT nasal spray Place 2 sprays into the nose daily.  16 g  11  . losartan (COZAAR) 25 MG tablet TAKE 1 TABLET (25 MG TOTAL) BY MOUTH DAILY.  90 tablet  0  . Polyethyl Glycol-Propyl Glycol (SYSTANE ULTRA) 0.4-0.3 % SOLN Place 1 drop into both eyes 3 (three) times daily as needed. dry eyes.      . pravastatin (PRAVACHOL) 80 MG tablet Take 1 tablet (80 mg total) by mouth daily.  90 tablet  3   No current facility-administered medications for this visit.    Review of Systems Review of Systems    All other systems reviewed and are negative.  Wt Readings from Last 3 Encounters:  06/16/14 198 lb (89.812 kg)  01/12/14 196 lb (88.905 kg)  01/06/14 192 lb (87.091 kg)   Temp Readings  from Last 3 Encounters:  06/16/14 98 F (36.7 C)   01/09/14 99.6 F (37.6 C) Oral  01/06/14 100.9 F (38.3 C) Oral   BP Readings from Last 3 Encounters:  06/16/14 150/96  01/12/14 150/90  01/09/14 143/76   Pulse Readings from Last 3 Encounters:  06/16/14 94  01/12/14 60  01/09/14 92     Physical Exam Physical Exam  Constitutional: She is oriented to person, place, and time. She appears well-developed and well-nourished. No distress. Well dressed.   Head: Normocephalic and atraumatic.  Eyes: Conjunctivae are normal. Pupils are equal, round, and reactive to light. No scleral icterus.  Neck: Normal range of motion. Neck supple. No tracheal deviation present. No thyromegaly present. No LAD Cardiovascular:  intact distal pulses.   Respiratory: Effort normal. No respiratory distress.  Breast:       L breast reconstructed, no masses or tenderness.  No nipple retraction.  Slightly faded nipple  tattoo.  R breast with biopsy scar .  Thickening at prior biopsy site.   GI: Soft. She exhibits no distension and no mass. There is no tenderness. There is no rebound and no guarding.  Musculoskeletal: Normal range of motion. She exhibits no edema and no tenderness.  Lymphadenopathy:    She has no cervical adenopathy. No supraclavicular adenopathy.  Neurological: She is alert and oriented to person, place, and time. Coordination normal.  Skin: Skin is warm and dry. No rash noted. She is not diaphoretic. No erythema. No pallor.  Psychiatric: She has a normal mood and affect. Her behavior is normal. Judgment and thought content normal.   Mammogram 03/23/2014 IMPRESSION:  No mammographic evidence of malignancy. A result letter of this  screening mammogram will be mailed directly to the patient.  RECOMMENDATION:  Screening mammogram in one year. (Code:SM-B-01Y)  BI-RADS CATEGORY 1: Negative.    Assessment/Plan History of breast cancer in female, s/p L MRM with reconstruction 1989 w [facility] No clinical evidence of disease.  Annual mammogram Follow up in 1 year.       Chrisha Vogel 06/16/2014, 9:43 AM

## 2014-07-19 ENCOUNTER — Other Ambulatory Visit (INDEPENDENT_AMBULATORY_CARE_PROVIDER_SITE_OTHER): Payer: Medicare Other

## 2014-07-19 DIAGNOSIS — Z23 Encounter for immunization: Secondary | ICD-10-CM

## 2014-07-21 ENCOUNTER — Ambulatory Visit (INDEPENDENT_AMBULATORY_CARE_PROVIDER_SITE_OTHER): Payer: Medicare Other | Admitting: Family Medicine

## 2014-07-21 VITALS — HR 92 | Temp 98.7°F

## 2014-07-21 DIAGNOSIS — T888XXA Other specified complications of surgical and medical care, not elsewhere classified, initial encounter: Secondary | ICD-10-CM

## 2014-07-21 DIAGNOSIS — R05 Cough: Secondary | ICD-10-CM

## 2014-07-21 DIAGNOSIS — T8090XA Unspecified complication following infusion and therapeutic injection, initial encounter: Secondary | ICD-10-CM

## 2014-07-21 DIAGNOSIS — R059 Cough, unspecified: Secondary | ICD-10-CM

## 2014-07-21 NOTE — Progress Notes (Signed)
   Subjective:    Patient ID: Sarah Howell, female    DOB: [birth year], 73 y.o.   MRN: [id number]  HPI She had injections in her right arm on Wednesday and noted yesterday some swelling and redness and is here for evaluation of back. She also complains of intermittent coughing but no fever, chills, sore throat or congestion. She cannot relate this to eating or necessarily position.   Review of Systems     Objective:   Physical Exam alert and in no distress. Tympanic membranes and canals are normal. Throat is clear. Tonsils are normal. Neck is supple without adenopathy or thyromegaly. Cardiac exam shows a regular sinus rhythm without murmurs or gallops. Lungs are clear to auscultation. Erythema is noted distal to the injection on the right arm approximately 10 x 8 cm in size. The area was marked with an ink pen.       Assessment & Plan:  Injection site reaction, initial encounter  Cough  supportive care for the cough. If the injection site gets worse she is to call me. This could possibly be cellulitis but I think it's more site reaction from her injection.

## 2014-08-21 ENCOUNTER — Telehealth: Payer: Self-pay | Admitting: Internal Medicine

## 2014-08-21 ENCOUNTER — Telehealth: Payer: Self-pay | Admitting: Family Medicine

## 2014-08-21 ENCOUNTER — Other Ambulatory Visit: Payer: Self-pay

## 2014-08-21 MED ORDER — HYDROCOD POLST-CHLORPHEN POLST 10-8 MG/5ML PO LQCR: 5.0000 mL | Freq: Two times a day (BID) | ORAL | Status: DC | PRN

## 2014-08-21 NOTE — Telephone Encounter (Signed)
Med called in and pt notified

## 2014-08-21 NOTE — Telephone Encounter (Signed)
Give her Tussionex

## 2014-08-21 NOTE — Telephone Encounter (Signed)
Pt has a cough that is not getting any better. Pt is coughing non stop and she would like some cough syrup. She does not want any tessalon perles as they do not help any. Send to [employer] [employer]

## 2014-08-21 NOTE — Telephone Encounter (Signed)
Done rx gave to tammy pt will come and pick up

## 2014-09-14 LAB — HM DEXA SCAN

## 2014-09-18 ENCOUNTER — Encounter: Payer: Self-pay | Admitting: Internal Medicine

## 2014-09-18 DIAGNOSIS — M8580 Other specified disorders of bone density and structure, unspecified site: Secondary | ICD-10-CM

## 2014-09-26 ENCOUNTER — Telehealth: Payer: Self-pay

## 2014-09-26 DIAGNOSIS — M8580 Other specified disorders of bone density and structure, unspecified site: Secondary | ICD-10-CM | POA: Insufficient documentation

## 2014-09-26 NOTE — Telephone Encounter (Signed)
Called patient to let her know the dexa scan showed low bone mass to continue on calcium and vitamin d left message on home #

## 2014-12-13 ENCOUNTER — Other Ambulatory Visit: Payer: Self-pay | Admitting: Family Medicine

## 2014-12-28 ENCOUNTER — Other Ambulatory Visit: Payer: Self-pay | Admitting: Family Medicine

## 2015-01-31 ENCOUNTER — Encounter: Payer: Self-pay | Admitting: Family Medicine

## 2015-01-31 ENCOUNTER — Ambulatory Visit (INDEPENDENT_AMBULATORY_CARE_PROVIDER_SITE_OTHER): Payer: Medicare Other | Admitting: Family Medicine

## 2015-01-31 VITALS — BP 124/82 | HR 70 | Ht 59.0 in | Wt 196.0 lb

## 2015-01-31 DIAGNOSIS — Z853 Personal history of malignant neoplasm of breast: Secondary | ICD-10-CM

## 2015-01-31 DIAGNOSIS — M8580 Other specified disorders of bone density and structure, unspecified site: Secondary | ICD-10-CM | POA: Diagnosis not present

## 2015-01-31 DIAGNOSIS — E785 Hyperlipidemia, unspecified: Secondary | ICD-10-CM | POA: Diagnosis not present

## 2015-01-31 DIAGNOSIS — Z8669 Personal history of other diseases of the nervous system and sense organs: Secondary | ICD-10-CM | POA: Diagnosis not present

## 2015-01-31 DIAGNOSIS — E669 Obesity, unspecified: Secondary | ICD-10-CM | POA: Diagnosis not present

## 2015-01-31 DIAGNOSIS — J309 Allergic rhinitis, unspecified: Secondary | ICD-10-CM | POA: Diagnosis not present

## 2015-01-31 DIAGNOSIS — I10 Essential (primary) hypertension: Secondary | ICD-10-CM

## 2015-01-31 DIAGNOSIS — L659 Nonscarring hair loss, unspecified: Secondary | ICD-10-CM | POA: Diagnosis not present

## 2015-01-31 LAB — CBC WITH DIFFERENTIAL/PLATELET
BASOS ABS: 0 10*3/uL (ref 0.0–0.1)
BASOS PCT: 0 % (ref 0–1)
EOS ABS: 0.2 10*3/uL (ref 0.0–0.7)
EOS PCT: 2 % (ref 0–5)
HCT: 41.1 % (ref 36.0–46.0)
Hemoglobin: 13.6 g/dL (ref 12.0–15.0)
Lymphocytes Relative: 33 % (ref 12–46)
Lymphs Abs: 2.7 10*3/uL (ref 0.7–4.0)
MCH: 31.4 pg (ref 26.0–34.0)
MCHC: 33.1 g/dL (ref 30.0–36.0)
MCV: 94.9 fL (ref 78.0–100.0)
MONOS PCT: 9 % (ref 3–12)
MPV: 11.1 fL (ref 8.6–12.4)
Monocytes Absolute: 0.7 10*3/uL (ref 0.1–1.0)
NEUTROS ABS: 4.6 10*3/uL (ref 1.7–7.7)
Neutrophils Relative %: 56 % (ref 43–77)
PLATELETS: 247 10*3/uL (ref 150–400)
RBC: 4.33 MIL/uL (ref 3.87–5.11)
RDW: 14.8 % (ref 11.5–15.5)
WBC: 8.3 10*3/uL (ref 4.0–10.5)

## 2015-01-31 LAB — COMPREHENSIVE METABOLIC PANEL
ALK PHOS: 65 U/L (ref 39–117)
ALT: 16 U/L (ref 0–35)
AST: 30 U/L (ref 0–37)
Albumin: 3.9 g/dL (ref 3.5–5.2)
BILIRUBIN TOTAL: 1 mg/dL (ref 0.2–1.2)
BUN: 14 mg/dL (ref 6–23)
CHLORIDE: 105 meq/L (ref 96–112)
CO2: 24 mEq/L (ref 19–32)
Calcium: 9.4 mg/dL (ref 8.4–10.5)
Creat: 0.78 mg/dL (ref 0.50–1.10)
GLUCOSE: 91 mg/dL (ref 70–99)
Potassium: 4.1 mEq/L (ref 3.5–5.3)
Sodium: 139 mEq/L (ref 135–145)
TOTAL PROTEIN: 8 g/dL (ref 6.0–8.3)

## 2015-01-31 LAB — LIPID PANEL
CHOLESTEROL: 191 mg/dL (ref 0–200)
HDL: 42 mg/dL — ABNORMAL LOW (ref >=46)
LDL Cholesterol: 125 mg/dL — ABNORMAL HIGH (ref 0–99)
TRIGLYCERIDES: 121 mg/dL (ref <150)
Total CHOL/HDL Ratio: 4.5 Ratio
VLDL: 24 mg/dL (ref 0–40)

## 2015-01-31 MED ORDER — LOSARTAN POTASSIUM 25 MG PO TABS: 25.0000 mg | ORAL_TABLET | Freq: Every day | ORAL | Status: DC

## 2015-01-31 MED ORDER — PRAVASTATIN SODIUM 80 MG PO TABS: 80.0000 mg | ORAL_TABLET | Freq: Every day | ORAL | Status: DC

## 2015-01-31 NOTE — Patient Instructions (Signed)
Use the Flonase daily

## 2015-01-31 NOTE — Progress Notes (Signed)
   Subjective:    Patient ID: Sarah Howell, female    DOB: Aug 23, 1941, 74 y.o.   MRN: [id number]  HPI She is here for medication check. She does have allergies and is using Zyrtec but Flonase on an as-needed basis. She has a history of osteopenia and is taking a multivitamin with extra vitamin D. She is followed regularly for her glaucoma by an ophthalmologist. She gets regular mammograms due to a previous history of breast cancer. She continues on her blood pressure medication as well as Pravachol and is having no difficulty with either of them. She sees a dermatologist for her hair loss and continues on finasteride.Her physical activity is somewhat limited. No dysphagia, chest pain, shortness of breath. Her immunizations and health maintenance were reviewed. She has been married for 50 years. Him and social history were reviewed. She wants to continue to get Pap smears.   Review of Systems  All other systems reviewed and are negative.      Objective:   Physical Exam Alert and in no distress. Fundi are benign.Tympanic membranes and canals are normal. Pharyngeal area is normal. Neck is supple without adenopathy or thyromegaly. Cardiac exam shows a regular sinus rhythm without murmurs or gallops. Lungs are clear to auscultation. Abdominal exam shows active bowel sounds without masses or tenderness.        Assessment & Plan:  Essential hypertension - Plan: CBC with Differential/Platelet, Comprehensive metabolic panel  Osteopenia - Plan: CBC with Differential/Platelet, Comprehensive metabolic panel  Obesity (BMI 30-39.9) - Plan: CBC with Differential/Platelet, Comprehensive metabolic panel, Lipid panel  Hyperlipidemia LDL goal <100 - Plan: Lipid panel, pravastatin (PRAVACHOL) 80 MG tablet  History of glaucoma  Allergic rhinitis, mild  History of breast cancer in female, s/p L MRM with reconstruction 1989 w [facility]  Alopecia she will continue on her present medication  regimen. Encouraged her to become more physically active. Recommend she use Flonase on a regular basis.Encouraged her to call in next time she sustains an abrasion or laceration for update on her tetanus shot.

## 2015-02-23 ENCOUNTER — Other Ambulatory Visit: Payer: Self-pay

## 2015-02-23 DIAGNOSIS — Z9012 Acquired absence of left breast and nipple: Secondary | ICD-10-CM

## 2015-02-23 DIAGNOSIS — Z1231 Encounter for screening mammogram for malignant neoplasm of breast: Secondary | ICD-10-CM

## 2015-02-26 IMAGING — CR DG CHEST 2V
2 series · 2 of 2 positions shown · non-contrast
Comparison: 04/07/2013

CLINICAL DATA: Cough and shortness of Breath.

EXAM:
CHEST  2 VIEW

[w chest pa]
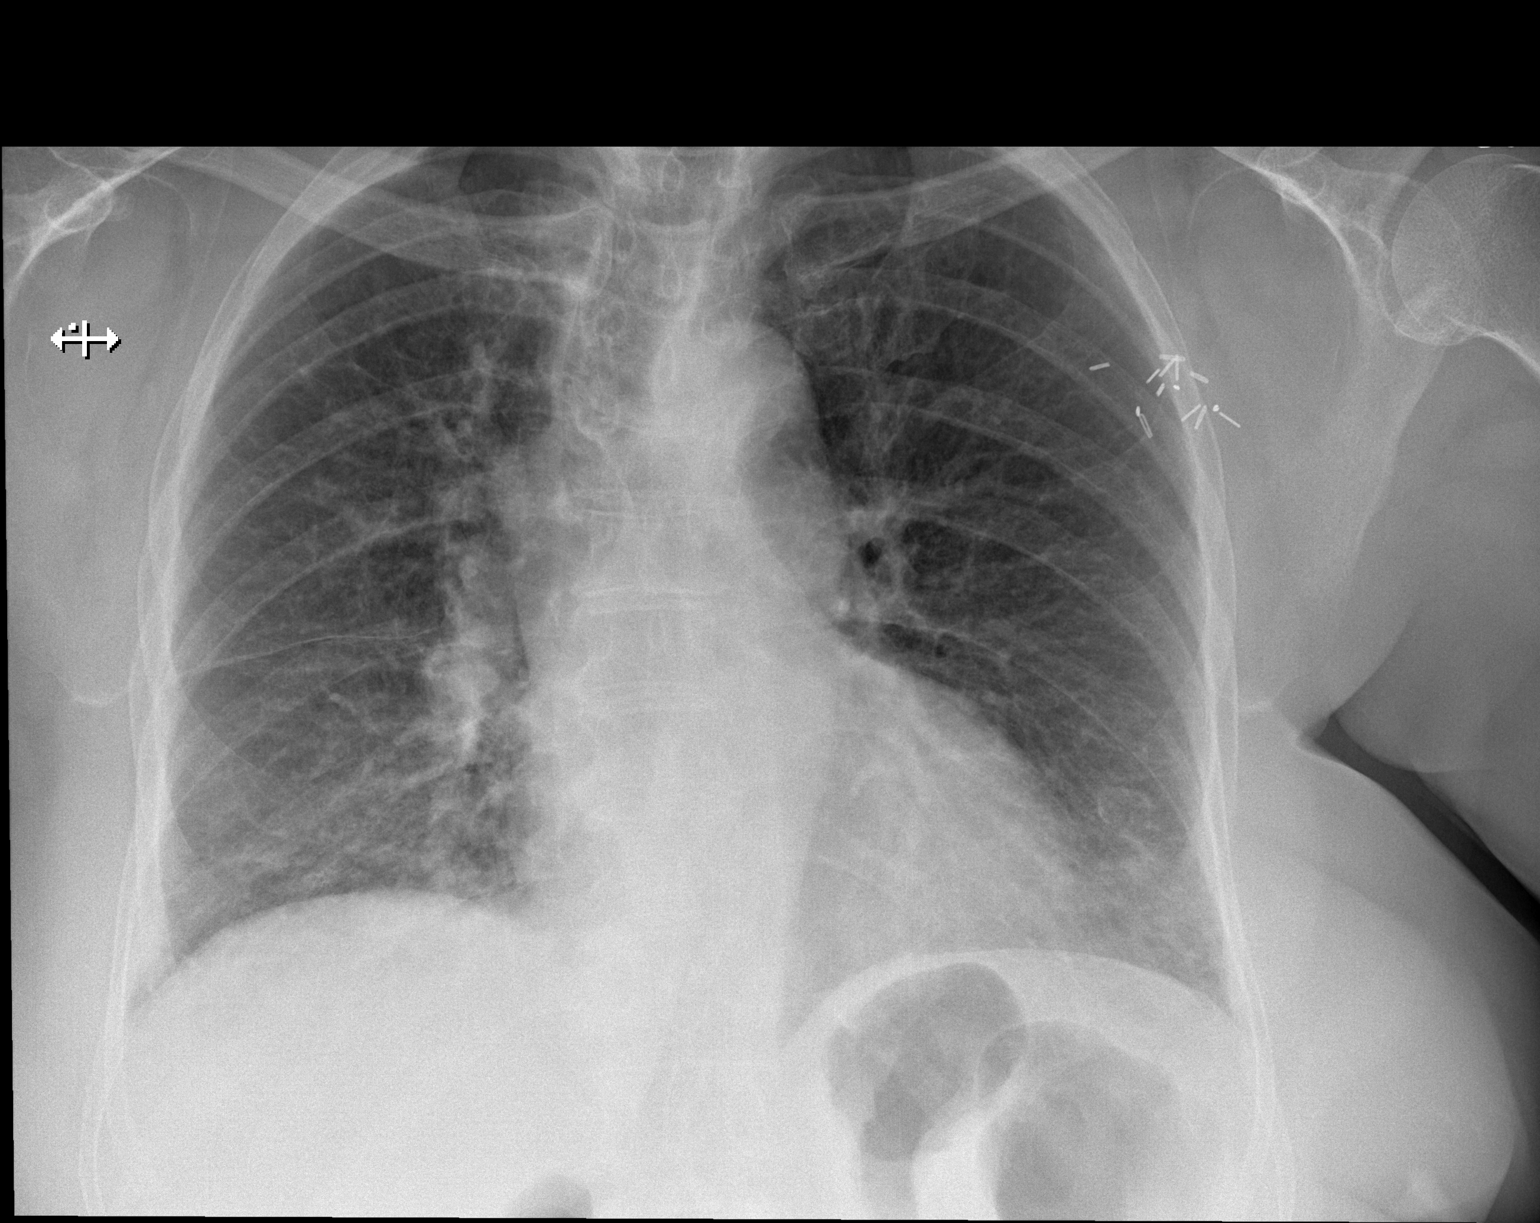

[w chest lat]
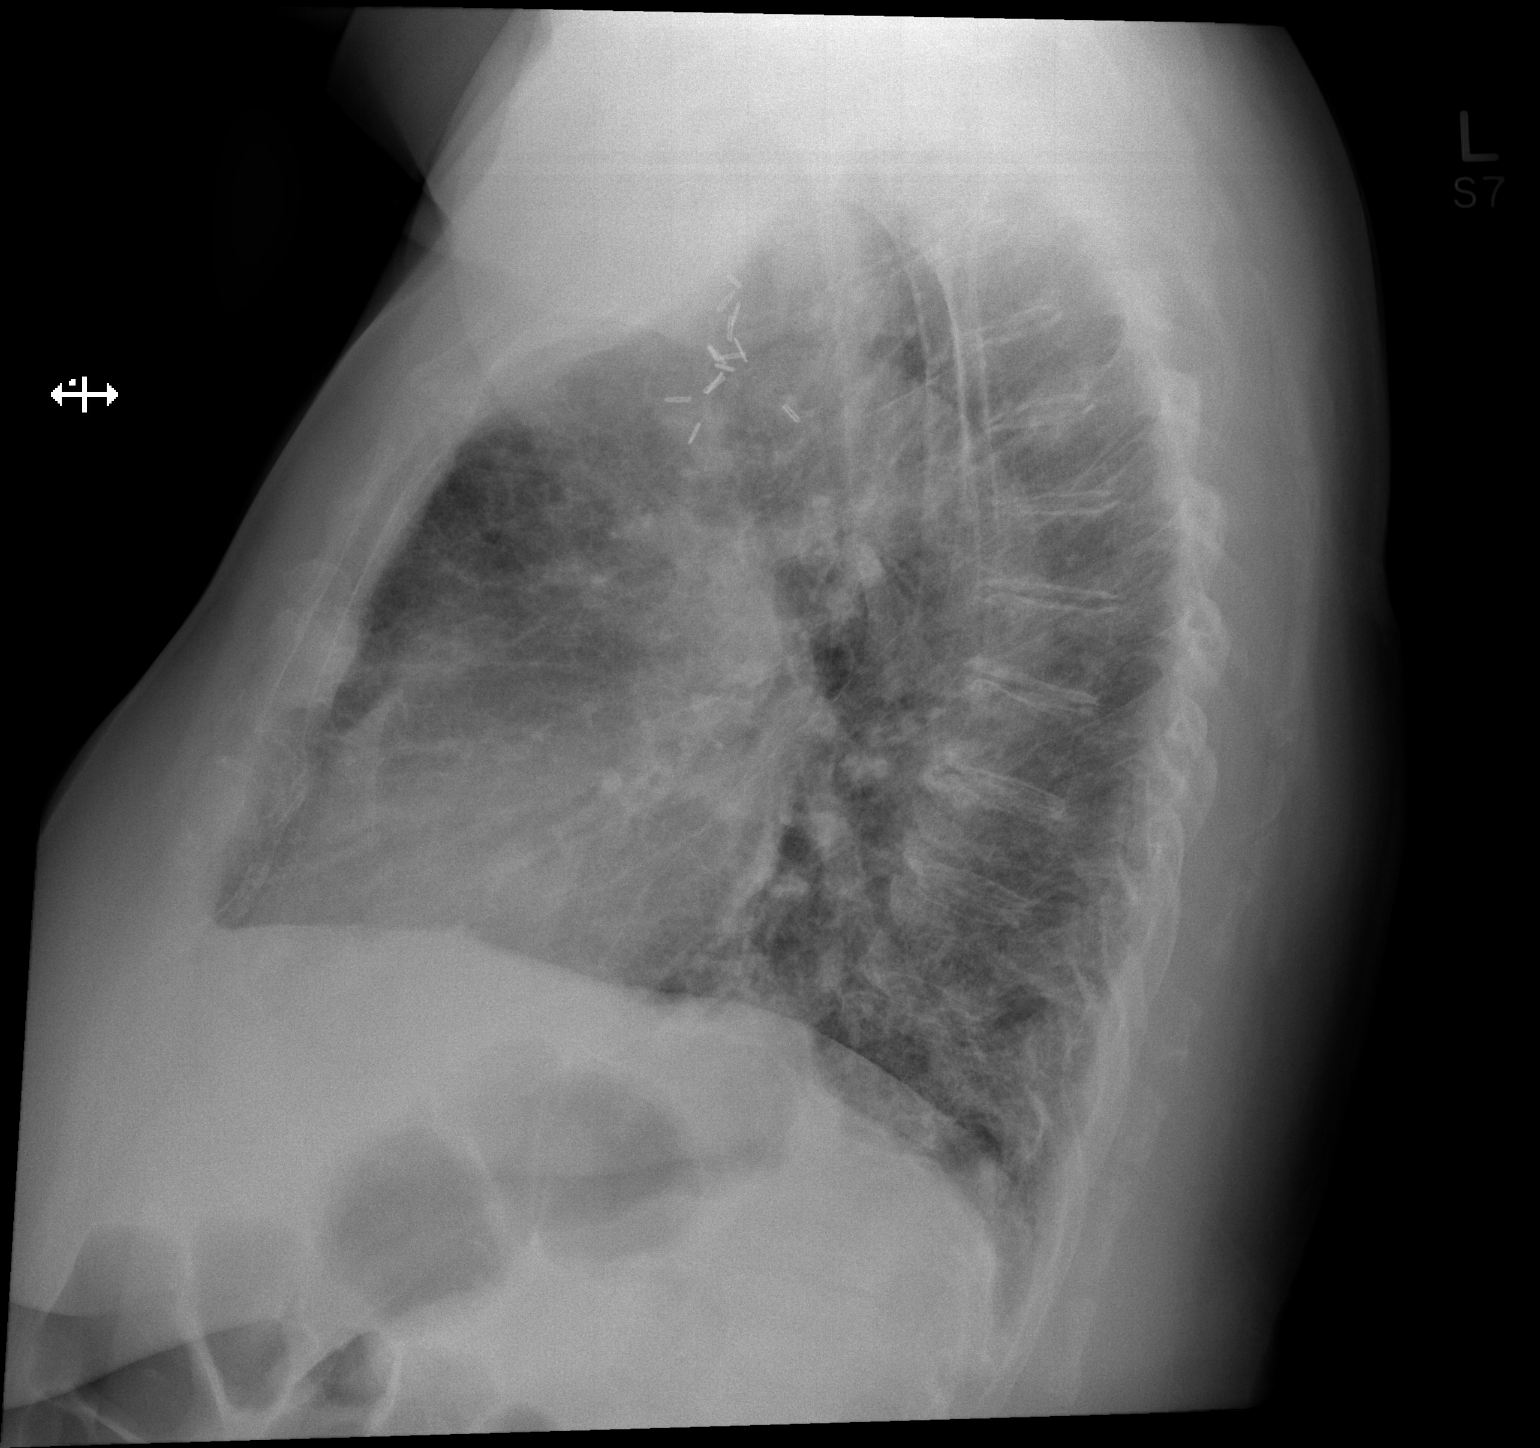

[2 of 2 positions shown; findings below may reference images not displayed]

FINDINGS: The heart is upper limits of normal and stable. There is tortuosity,
ectasia and calcification of the thoracic aorta. The lungs
demonstrate chronic bronchitic type interstitial changes but no
definite acute overlying pulmonary process. There are surgical
changes related to left breast surgery. The bony thorax is grossly
intact.
IMPRESSION: Chronic lung changes but no acute pulmonary findings.

## 2015-03-27 ENCOUNTER — Other Ambulatory Visit: Payer: Self-pay | Admitting: Family Medicine

## 2015-03-28 ENCOUNTER — Ambulatory Visit
Admission: RE | Admit: 2015-03-28 | Discharge: 2015-03-28 | Disposition: A | Discharge status: Home / Self Care | Payer: Medicare Other | Source: Ambulatory Visit

## 2015-03-28 DIAGNOSIS — Z1231 Encounter for screening mammogram for malignant neoplasm of breast: Secondary | ICD-10-CM

## 2015-03-28 DIAGNOSIS — Z9012 Acquired absence of left breast and nipple: Secondary | ICD-10-CM

## 2015-03-29 ENCOUNTER — Other Ambulatory Visit: Payer: Self-pay | Admitting: Obstetrics and Gynecology

## 2015-03-29 DIAGNOSIS — R928 Other abnormal and inconclusive findings on diagnostic imaging of breast: Secondary | ICD-10-CM

## 2015-04-03 ENCOUNTER — Ambulatory Visit
Admission: RE | Admit: 2015-04-03 | Discharge: 2015-04-03 | Disposition: A | Discharge status: Home / Self Care | Payer: Medicare Other | Source: Ambulatory Visit | Attending: Obstetrics and Gynecology | Admitting: Obstetrics and Gynecology

## 2015-04-03 DIAGNOSIS — R928 Other abnormal and inconclusive findings on diagnostic imaging of breast: Secondary | ICD-10-CM

## 2015-06-22 ENCOUNTER — Telehealth: Payer: Self-pay | Admitting: Genetic Counselor

## 2015-06-22 NOTE — Telephone Encounter (Signed)
genetic referrral-s/w patient and gave genetic appt for 09/13 @ 9 w/kayla Boggs.

## 2015-07-10 ENCOUNTER — Ambulatory Visit (HOSPITAL_BASED_OUTPATIENT_CLINIC_OR_DEPARTMENT_OTHER): Payer: Medicare Other | Admitting: Genetic Counselor

## 2015-07-10 ENCOUNTER — Other Ambulatory Visit: Payer: Medicare Other

## 2015-07-10 ENCOUNTER — Encounter: Payer: Self-pay | Admitting: Genetic Counselor

## 2015-07-10 DIAGNOSIS — Z809 Family history of malignant neoplasm, unspecified: Secondary | ICD-10-CM | POA: Diagnosis not present

## 2015-07-10 DIAGNOSIS — Z803 Family history of malignant neoplasm of breast: Secondary | ICD-10-CM

## 2015-07-10 DIAGNOSIS — Z8042 Family history of malignant neoplasm of prostate: Secondary | ICD-10-CM

## 2015-07-10 DIAGNOSIS — Z800 Family history of malignant neoplasm of digestive organs: Secondary | ICD-10-CM | POA: Diagnosis not present

## 2015-07-10 DIAGNOSIS — Z853 Personal history of malignant neoplasm of breast: Secondary | ICD-10-CM | POA: Diagnosis not present

## 2015-07-10 NOTE — Progress Notes (Signed)
REFERRING PROVIDER: Stark Klein, MD  PRIMARY PROVIDER:  Wyatt Haste, MD  PRIMARY REASON FOR VISIT:  1. History of breast cancer in female, s/p L MRM with reconstruction 1989 w [facility]   2. Family history of breast cancer in sister   64. Family history of colon cancer in mother   94. Family history of pancreatic cancer   5. Family history of prostate cancer in father   36. Family history of cancer      HISTORY OF PRESENT ILLNESS:   Sarah Howell, a 74 y.o. female, was seen for a [facility] cancer genetics consultation at the request of Dr. Barry Dienes due to a personal history of breast cancer at 37, and family history of breast, pancreatic, and other cancers.  Sarah Howell reports that she had negative genetic testing around 2001. Sarah Howell presents to clinic today to discuss the possibility of a hereditary predisposition to cancer, updated genetic testing options, and to further clarify her future cancer risks, as well as potential cancer risks for family members.   In 1989, at the age of 34, Sarah Howell was diagnosed with cancer of the left breast. This was treated with left mastectomy and reconstruction.  Since that time, Sarah Howell has had no additional cancers.   CANCER HISTORY:   No history exists.     HORMONAL RISK FACTORS:  Menarche was at age 38.  First live birth at age 41.  OCP use for  approximately 10 years.   Ovaries intact: yes.  Hysterectomy: no.  Menopausal status: postmenopausal.  HRT use: 0 years. Colonoscopy: yes; normal; receives every 5 years due to mother's history. Mammogram within the last year: yes. Number of breast biopsies: 2 - benign biopsy of her right breast at the same time of her left breast cancer in 1989 Up to date with pelvic exams:  yes. Any excessive radiation exposure in the past:  no  Past Medical History  Diagnosis Date  . Alopecia   . Osteopenia   . Hyperlipidemia   . Cough   . Allergy   . Glaucoma     bilateral  . Cancer      BREAST  . Shortness of breath     WITH EXERTION   . Hypertension     dr Demetrios Isaacs    pcp  . Breast cancer 1989    left breast cancer; s/p mastectomy and reconstruction    Past Surgical History  Procedure Laterality Date  . Lt breast ca reconstruction  07/28/1988  . Orthoscopic surgery lt knee  2002  . Orthoscopic surgery rt knee  08/05/2009  . Rotoator cuff cleaning    . Breast surgery      lft br mastectomy and reconstruction  . Cataract extraction w/phaco  12/10/2011    Procedure: CATARACT EXTRACTION PHACO AND INTRAOCULAR LENS PLACEMENT (IOC);  Surgeon: Marylynn Pearson, MD;  Location: [place];  Service: Ophthalmology;  Laterality: Right;  . Cataract extraction w/phaco  02/04/2012    Procedure: CATARACT EXTRACTION PHACO AND INTRAOCULAR LENS PLACEMENT (IOC);  Surgeon: Marylynn Pearson, MD;  Location: [place];  Service: Ophthalmology;  Laterality: Left;  [facility] Kitchen Eye surgery Bilateral 2013    cataract surgery    Social History   Social History  . Marital Status: Married    Spouse Name: N/A  . Number of Children: N/A  . Years of Education: N/A   Social History Main Topics  . Smoking status: Never Smoker   . Smokeless tobacco: Never Used  . Alcohol Use:  No  . Drug Use: No  . Sexual Activity: Not Currently   Other Topics Concern  . Not on file   Social History Narrative     FAMILY HISTORY:  We obtained a detailed, 4-generation family history.  Significant diagnoses are listed below: Family History  Problem Relation Age of Onset  . Colon cancer Mother     dx. 42s; "recurrence" at 70  . Prostate cancer Father     dx. 6s  . Pancreatic cancer Brother     dx. 81-82  . Breast cancer Sister 81  . Pancreatic cancer Sister   . Parkinson's disease Brother   . Prostate cancer Son 65  . Cancer Other     unspecified type  . Diabetes Paternal Aunt   . Diabetes Paternal Uncle   . Breast cancer Cousin     dx. <70s  . Breast cancer Cousin     dx. >50    Sarah Howell. Alden has one son from a  previous partnership; he is 67 and was recently diagnosed with prostate cancer.  She has two more sons and one daughter--none of whom have had cancer.  She currently has seven grandchildren and none have had any cancer diagnoses.  Sarah Howell has five sisters and five brothers.  Two brothers died in early childhood.  One died with Parkinson's at 49, another died of pancreatic cancer at 7.  The last brother is currently 69 and has never had cancer.  Two of Sarah Howell. Magar's sisters have passed away--one at 63 with dementia and one at 17 from pancreatic cancer.  One sister is currently 26 and has a history of breast cancer diagnosed at 54.  Two more sisters are in their early 76s and have never had cancer.  None of Sarah Howell. Kasper's nieces or nephews have had cancer.    Sarah Howell mother died of colon cancer at 15.  She was first diagnosed with colon cancer in her 41s and that was surgically resected, then she had colon cancer again at 95 and chose not to have any further surgery.  Sarah Howell mother had three full sisters and four full brothers.  Sarah Howell believes that all of these maternal aunts and uncles passed away later in life and did not have cancer.  She is unaware of any cancer diagnoses for any of her maternal cousins.  Her maternal grandmother died in her 46s and her maternal grandfather died at 2 from "natural causes"--neither had cancer.  Two maternal great aunts (sisters of her grandfather) had some type of cancer, but Sarah Howell is unaware of what type and at what ages they were diagnosed.    Sarah Howell father died at 65.  He had a history of prostate cancer in his 44s.  He had five full brothers and five full sisters.  His brothers all passed away between 77 and 76 years of age and did not have cancer--many had diabetes.  Two sisters died at 56 or 30, but did not have cancer.  The other three sisters passed away later in life.  The daughter of one of these aunt was diagnosed with breast cancer at a age  younger than 32.  This aunt also had a son who has not had cancer.  The granddaughter of another paternal aunt was diagnosed with cancer at an age older than 55; she has two brothers who have not had cancer.  Sarah Howell paternal grandmother died at 42 and her grandfather died at 6.  She  is unaware of any additional cancer diagnoses in the family, but has some limited information for some of her paternal cousins.  Patient's maternal ancestors are of Caucasian and African American descent, and paternal ancestors are of African American descent. There is no reported Ashkenazi Jewish ancestry. There is no known consanguinity.  GENETIC COUNSELING ASSESSMENT: Sarah Howell is a 74 y.o. female with a personal and family history of cancer which is somewhat suggestive of a hereditary cancer syndrome and predisposition to cancer. We, therefore, discussed and recommended the following at today's visit.   DISCUSSION: We reviewed the characteristics, features and inheritance patterns of hereditary cancer syndromes, particularly those caused by mutations within the BRCA1/2, PALB2, and Lynch syndrome genes. We also discussed genetic testing, including the appropriate family members to test, the process of testing, insurance coverage and turn-around-time for results. We discussed the implications of a negative, positive and/or variant of uncertain significant result. We recommended Sarah Howell pursue genetic testing for the 32-gene Comprehensive Cancer Panel through [facility] Hope Pigeon, MD).  The Comprehensive Cancer Panel offered by [facility] includes sequencing andeletion duplication testing of the following 30 genes: APC, ATM, AXIN2, BARD1, BMPR1A, BRCA1, BRCA2, BRIP1, CDH1, CDK4, CDKN2A, CHEK2, FANCC, MLH1, MSH2, MSH6, MUTYH, NBN, PALB2, PMS2, POLD1, POLE, PTEN, RAD51C, RAD51D, SMAD4, STK11, TP53, VHL, and XRCC2.  Additionally, this panel includes deletion/duplication analysis (without sequencing) for two genes,  EPCAM and SCG5/GREM1.  Based on Sarah Howell's personal and family history of cancer, she meets medical criteria for genetic testing. Despite that she meets criteria, she may still have an out of pocket cost. We discussed that if her out of pocket cost for testing is over $100, the laboratory will call and confirm whether she wants to proceed with testing.  If the out of pocket cost of testing is less than $100 she will be billed by the genetic testing laboratory.   PLAN: After considering the risks, benefits, and limitations, Sarah Howell  provided informed consent to pursue genetic testing and the blood sample was sent to [facility] for analysis of the 32-gene Comprehensive Cancer Panel test. Results should be available within approximately 2-3 weeks' time, at which point they will be disclosed by telephone to Sarah Howell. Gotsch, as will any additional recommendations warranted by these results. Sarah Howell. Basham will receive a summary of her genetic counseling visit and a copy of her results once available. This information will also be available in Epic. We encouraged Sarah Howell. Luty to remain in contact with cancer genetics annually so that we can continuously update the family history and inform her of any changes in cancer genetics and testing that may be of benefit for her family. Sarah Howell. Mckellips questions were answered to her satisfaction today. Our contact information was provided should additional questions or concerns arise.  Thank you for the referral and allowing [place] to share in the care of your patient.   Jeanine Luz, Sarah Howell Genetic Counselor [email or web address] Phone: [phone]  The patient was seen for a total of 60 minutes in face-to-face genetic counseling.  This patient was discussed with Drs. Magrinat, Lindi Adie and/or Burr Medico who agrees with the above.    _______________________________________________________________________ For Office Staff:  Number of people involved in session: 2 Was an Intern/  student involved with case: no

## 2015-07-24 ENCOUNTER — Encounter: Payer: Self-pay | Admitting: Family Medicine

## 2015-07-24 ENCOUNTER — Ambulatory Visit (INDEPENDENT_AMBULATORY_CARE_PROVIDER_SITE_OTHER): Payer: Medicare Other | Admitting: Family Medicine

## 2015-07-24 VITALS — BP 122/80 | HR 80 | Wt 197.0 lb

## 2015-07-24 DIAGNOSIS — L659 Nonscarring hair loss, unspecified: Secondary | ICD-10-CM | POA: Diagnosis not present

## 2015-07-24 DIAGNOSIS — E785 Hyperlipidemia, unspecified: Secondary | ICD-10-CM

## 2015-07-24 DIAGNOSIS — E669 Obesity, unspecified: Secondary | ICD-10-CM | POA: Diagnosis not present

## 2015-07-24 DIAGNOSIS — I10 Essential (primary) hypertension: Secondary | ICD-10-CM | POA: Diagnosis not present

## 2015-07-24 DIAGNOSIS — J309 Allergic rhinitis, unspecified: Secondary | ICD-10-CM | POA: Diagnosis not present

## 2015-07-24 DIAGNOSIS — R519 Headache, unspecified: Secondary | ICD-10-CM

## 2015-07-24 DIAGNOSIS — Z23 Encounter for immunization: Secondary | ICD-10-CM | POA: Diagnosis not present

## 2015-07-24 DIAGNOSIS — R51 Headache: Secondary | ICD-10-CM | POA: Diagnosis not present

## 2015-07-24 MED ORDER — FINASTERIDE 5 MG PO TABS: 2.5000 mg | ORAL_TABLET | Freq: Every day | ORAL | Status: AC

## 2015-07-24 MED ORDER — FLUTICASONE PROPIONATE 50 MCG/ACT NA SUSP: NASAL | Status: DC

## 2015-07-24 NOTE — Patient Instructions (Addendum)
150 minutes a week of something physical Heat for 20 minutes 3 times a day. Stretching after that. 2 Aleve twice per day and do this for roughly a week

## 2015-07-24 NOTE — Progress Notes (Signed)
   Subjective:    Patient ID: Sarah Howell, female    DOB: 01-06-1941, 74 y.o.   MRN: [id number]  HPI She is here complaining of a one-month history of intermittent right occipital and shoulder discomfort. No history of injury. No blurred or double vision, earache. No numbness, tingling or weakness in her arm. She does have underlying allergies as well as hypertension, hyperlipidemia and is on appropriate medications. She would also like a refill on her Proscar. Her dermatologist usually gives this to her but she does not have an appointment until late October. She demonstrates no cognitive dysfunction or evidence of depression. Her physical activity is limited. Her blood work was reviewed.   Review of Systems     Objective:   Physical Exam Alert and in no distress. Tympanic membranes and canals are normal. Pharyngeal area is normal. Neck is supple without adenopathy or thyromegaly. Cardiac exam shows a regular sinus rhythm without murmurs or gallops. Lungs are clear to auscultation. Normal motor, sensory and DTRs of her arms. No palpable tenderness in her right upper shoulder area.       Assessment & Plan:  Headache, unspecified headache type  Need for prophylactic vaccination and inoculation against influenza - Plan: Flu vaccine HIGH DOSE PF (Fluzone High dose), fluticasone (FLONASE) 50 MCG/ACT nasal spray  Essential hypertension  Allergic rhinitis, mild  Obesity (BMI 30-39.9)  Alopecia - Plan: finasteride (PROSCAR) 5 MG tablet  I will treat her initially with heat and stretching as well as 2 Aleve twice per day. If she continues to have difficulty she should return here for further evaluation.

## 2015-08-06 ENCOUNTER — Telehealth: Payer: Self-pay | Admitting: Genetic Counselor

## 2015-08-31 ENCOUNTER — Ambulatory Visit: Payer: Self-pay | Admitting: Genetic Counselor

## 2015-08-31 DIAGNOSIS — Z1379 Encounter for other screening for genetic and chromosomal anomalies: Secondary | ICD-10-CM

## 2015-08-31 NOTE — Telephone Encounter (Signed)
I first called Ms. Sarah Howell back in early October when her genetic test results first came back.  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  Briefly discussed that sometimes the lab will offer family studies to relatives to better determine if these two changes are both on one copy of the ATM gene or if they are on different copies of the ATM gene.  Ms. Sarah Howell did not seem interested in this, so I will not inquire about family studies for these VUSes.  We discussed that family history in light of this result leads [place] to believe that this is probably a reassuring result for [place].  However, Ms. Sarah Howell should keep her phone number up-to-date with [place] so that we may call her back, if/when the lab updates these VUSes.  Ms. Sarah Howell has gotten off the phone the two times that I have talked to her because she couldn't hear me or was going in to an appt.  I would like to have briefly mentioned that her daughter should continue with mammogram screening and answer any further questions she might have for me, but she has not called me back at a more convenient time, as she previously said she would.  And I have called and left her messages to no avail.  In my last message left on her mobile phone, I let her know I would mail her a copy of her results and a letter as well.  She can always reach out to me with any questions.

## 2015-09-10 ENCOUNTER — Encounter: Payer: Self-pay | Admitting: Genetic Counselor

## 2015-09-10 DIAGNOSIS — Z1379 Encounter for other screening for genetic and chromosomal anomalies: Secondary | ICD-10-CM | POA: Insufficient documentation

## 2015-09-10 NOTE — Progress Notes (Signed)
GENETIC TEST RESULT  HPI: Sarah Howell was previously seen in the [place] clinic due to a personal history of breast cancer, family history of breast, pancreatic, prostate, and other cancers, and concerns regarding a hereditary predisposition to cancer.  Sarah Howell had previous negative BRACAnalysis testing through [facility] ([place], [place]).  BRACAnalysis included BRCA1/2 gene sequencing with BRCA1 5-site rearrangement panel, without BART testing.  Date of report is June 15, 2007.  Sarah Howell returned to the [facility] following receipt of our recall letter regarding updated genetic testing options.  Please refer to our prior cancer genetics clinic note from July 10, 2015 for more information regarding Sarah Howell's medical, social and family histories, and our assessment and recommendations, at the time. Sarah Howell recent genetic test results were disclosed to her, as were recommendations warranted by these results. These results and recommendations are discussed in more detail below.  GENETIC TEST RESULTS: At the time of Sarah Howell's visit on 07/10/15, we recommended she pursue genetic testing of the 32-gene Comprehensive Cancer Panel through [facility] Hope Pigeon, MD).  The Comprehensive Cancer Panel offered by [facility] includes sequencing and/or deletion duplication testing of the following 32 genes: APC, ATM, AXIN2, BARD1, BMPR1A, BRCA1, BRCA2, BRIP1, CDH1, CDK4, CDKN2A, CHEK2, EPCAM, FANCC, MLH1, MSH2, MSH6, MUTYH, NBN, PALB2, PMS2, POLD1, POLE, PTEN, RAD51C, RAD51D, SCG5/GREM1, SMAD4, STK11, TP53, VHL, and XRCC2.  Those results are now back, the report date for which is August 03, 2015.  Genetic testing was normal, and did not reveal a deleterious mutation in these genes.  Two variants of uncertain significance (VUSes) called "c.2608A>G (p.Asn870Asp)" and "c.8993T>C (p.Ile2998Thr)" were found in the ATM gene.  The test report will be  scanned into EPIC and will be located under the Results Review tab in the Pathology>Molecular Pathology section.   Genetic testing did identify two variants of uncertain significance (VUSes) called "c.2608A>G (p.Asn870Asp)" and "c.8993T>C (p.Ile2998Thr)" in the ATM gene.  It is unknown whether these two VUSes are located on the same or separate copies of the ATM gene.  At this time, it is unknown if these VUSes are associated with an increased risk for cancer or if this is a normal finding. Since these VUS results are uncertain, they cannot help guide screening recommendations, and family members should not be tested for these VUSes to help define their own cancer risks.  Also, we all have variants within our genes that make [place] unique individuals--most of these variants are benign.  Thus, we treat these VUSes as a negative result.   With time, we suspect the lab will reclassify these variants and when they do, we will try to re-contact Sarah Howell to discuss the reclassification further.  We also encouraged Sarah Howell to contact us in a year or two to obtain an update on the status of these VUSes.  Further family variant studies may be helpful to determine whether these VUSes are located on the same copy of the ATM gene or different copies and to further elucidate whether these VUSes are meaningful.    We discussed with Sarah Howell that since the current genetic testing is not perfect, it is possible there may be a gene mutation in one of these genes that current testing cannot detect, but that chance is small. We also discussed, that it is possible that another gene that has not yet been discovered, or that we have not yet tested, is responsible for the cancer diagnoses in the family,  and it is, therefore, important to remain in touch with cancer genetics in the future so that we can continue to offer Sarah Howell the most up-to-date genetic testing.   CANCER SCREENING RECOMMENDATIONS: While we still do not have  an explanation for the history of cancer, this result is reassuring and indicates that Sarah Howell likely does not have an increased risk for a future cancer due to a mutation in one of these genes. This normal test also suggests that Sarah Howell's cancer was most likely not due to an inherited predisposition associated with one of these genes.  Most cancers happen by chance and this negative test suggests that her cancer falls into this category.  Many of Sarah Howell's family members have not been diagnosed with cancer and have lived to later ages in life.  Additionally, many of those family members who have had cancer were diagnosed at later ages.  Thus, the family history may be reassuring in light of this result.  We, therefore, recommended she continue to follow the cancer management and screening guidelines provided by her oncology and primary healthcare providers.   RECOMMENDATIONS FOR FAMILY MEMBERS: Women in this family might be at some increased risk of developing cancer, over the general population risk, simply due to the family history of cancer. We recommended women in this family have a yearly mammogram beginning at age 50, or 70 years younger than the earliest onset of cancer, an an annual clinical breast exam, and perform monthly breast self-exams. Ms. Blanchet daughter should continue to have annual mammogram screening.  Women in this family should also have a gynecological exam as recommended by their primary provider. All family members should have a colonoscopy by age 69.  Men in the family should follow the prostate screening recommendations provided by their physicians.    Further family variant studies may be helpful for further elucidating more information regarding these two ATM gene VUSes and whether they are important.  Sarah Howell did not seem interested in potentially reaching out to affected family members regarding family studies, so I will not reach out to [facility] about whether this would  be an option for the family.  Based on the family history of cancer,  her sister, who was diagnosed with breast cancer at age 8 may would be eligible for genetic counseling and testing, if interested.  However, it makes it less likely that her sister would have a positive test result since Ms. Dado was diagnosed at a much younger age than her sister.  Sarah Howell let us know if we can be of any assistance in coordinating genetic counseling and/or testing for this family member.   FOLLOW-UP: Lastly, we discussed with Sarah Howell that cancer genetics is a rapidly advancing field and it is possible that new genetic tests will be appropriate for her and/or her family members in the future. We encouraged her to remain in contact with cancer genetics on an annual basis so we can update her personal and family histories and let her know of advances in cancer genetics that may benefit this family.   Our contact number was provided. Sarah Howell questions were answered to her satisfaction, and she knows she is welcome to call us at anytime with additional questions or concerns.   Jeanine Luz, MS Genetic Counselor [email or web address] Phone: [phone]

## 2015-09-12 ENCOUNTER — Telehealth: Payer: Self-pay | Admitting: Family Medicine

## 2015-09-12 NOTE — Telephone Encounter (Signed)
Pt requesting med for cough that started on Thursday or Friday. She can not stop coughing and it is a deep cough. She says she usually need meds for this that Dr Redmond School will call in. She ha not tried anything for the cough so far. Pt can be reached at [phone]

## 2015-09-12 NOTE — Telephone Encounter (Signed)
Patient's husband called back re earlier message left about cough, advised them per Dr. Lanice Shirts note to try Robitussin DM and Nyquil at night and to call back If not helping

## 2015-09-12 NOTE — Telephone Encounter (Signed)
Have her try Robitussin-DM and NyQuil at night. If this does not help have her call back tomorrow

## 2015-09-14 ENCOUNTER — Ambulatory Visit
Admission: RE | Admit: 2015-09-14 | Discharge: 2015-09-14 | Disposition: A | Discharge status: Home / Self Care | Payer: Medicare Other | Source: Ambulatory Visit | Attending: Medical | Admitting: Medical

## 2015-09-14 ENCOUNTER — Ambulatory Visit (INDEPENDENT_AMBULATORY_CARE_PROVIDER_SITE_OTHER): Payer: Medicare Other | Admitting: Medical

## 2015-09-14 ENCOUNTER — Encounter: Payer: Self-pay | Admitting: Medical

## 2015-09-14 ENCOUNTER — Other Ambulatory Visit: Payer: Self-pay | Admitting: Medical

## 2015-09-14 VITALS — BP 120/78 | HR 94 | Temp 98.5°F | Wt 198.0 lb

## 2015-09-14 DIAGNOSIS — G4489 Other headache syndrome: Secondary | ICD-10-CM

## 2015-09-14 DIAGNOSIS — R05 Cough: Secondary | ICD-10-CM | POA: Diagnosis not present

## 2015-09-14 DIAGNOSIS — R059 Cough, unspecified: Secondary | ICD-10-CM

## 2015-09-14 DIAGNOSIS — R112 Nausea with vomiting, unspecified: Secondary | ICD-10-CM | POA: Diagnosis not present

## 2015-09-14 MED ORDER — PROMETHAZINE-DM 6.25-15 MG/5ML PO SYRP: 5.0000 mL | ORAL_SOLUTION | Freq: Four times a day (QID) | ORAL | Status: DC | PRN

## 2015-09-14 MED ORDER — ALBUTEROL SULFATE HFA 108 (90 BASE) MCG/ACT IN AERS: 2.0000 | INHALATION_SPRAY | Freq: Four times a day (QID) | RESPIRATORY_TRACT | Status: DC | PRN

## 2015-09-14 MED ORDER — AZITHROMYCIN 250 MG PO TABS: ORAL_TABLET | ORAL | Status: DC

## 2015-09-14 NOTE — Progress Notes (Signed)
Subjective: Chief Complaint  Patient presents with  . Cough    been having periodical headaches. been going on a week. said this happens  every year thinks it bronchitis.   Here for not feeling well.  Been feeling bad a few days.   Been having headache and cough.  Cough been there over a week but is worsening.  Called here earlier in the week, and cough medication was advised, Nyquil.  Has been using this.   Has stuffy head, plenty of mucous, some congestion in chest.  Has had occasional nausea, has vomited a few times.  No back or abdominal pain, no GU symptoms.  Denies fever.  Denies sore throat, ear pain, wheezing. Headaches come and go, but coughing so much getting headaches.  She has discussed headache prior with Dr. Redmond School here.  No hx/o asthma, COPD or lung issues.  No chest pain, no leg edema.  No sick contacts.     Past Medical History  Diagnosis Date  . Alopecia   . Osteopenia   . Hyperlipidemia   . Cough   . Allergy   . Glaucoma     bilateral  . Cancer ([place])     BREAST  . Shortness of breath     WITH EXERTION   . Hypertension     dr Demetrios Isaacs    pcp  . Breast cancer ([place]) 1989    left breast cancer; s/p mastectomy and reconstruction    ROS as in subjective   Objective: BP 120/78 mmHg  Pulse 94  Temp(Src) 98.5 F (36.9 C) (Oral)  Wt 198 lb (89.812 kg)  General appearance: Alert, WD/WN, no distress, ill appearing                             Skin: warm, no rash, no diaphoresis                           Head: no sinus tenderness                            Eyes: conjunctiva normal, corneas clear, PERRLA                            Ears: pearly TMs, external ear canals normal                          Nose: septum midline, turbinates swollen, with erythema and clear discharge             Mouth/throat: MMM, tongue normal, mild pharyngeal erythema                           Neck: supple, no adenopathy, no thyromegaly, non tender                          Heart: RRR, normal  S1, S2, no murmurs                         Lungs: +bronchial breath sounds, +scattered rhonchi, no wheezes, no rales                Extremities: no edema, non tender      Assessment: Encounter Diagnoses  Name Primary?  . Cough Yes  . Other headache syndrome   . Non-intractable vomiting with nausea, unspecified vomiting type    Plan: Discussed symptoms, concerns.   Rest, hydrate well, can use Promethazine DM for cough, Albuterol inhaler q4-6 hours, go for Chest Xray.    We will call with results and plan.

## 2015-09-17 ENCOUNTER — Other Ambulatory Visit: Payer: Self-pay | Admitting: Medical

## 2015-09-17 MED ORDER — ALBUTEROL SULFATE HFA 108 (90 BASE) MCG/ACT IN AERS: 2.0000 | INHALATION_SPRAY | Freq: Four times a day (QID) | RESPIRATORY_TRACT | Status: DC | PRN

## 2015-11-21 ENCOUNTER — Telehealth: Payer: Self-pay | Admitting: Family Medicine

## 2015-11-21 ENCOUNTER — Other Ambulatory Visit: Payer: Self-pay | Admitting: Family Medicine

## 2015-11-21 ENCOUNTER — Other Ambulatory Visit (HOSPITAL_COMMUNITY): Payer: Self-pay | Admitting: Family Medicine

## 2015-11-21 DIAGNOSIS — R059 Cough, unspecified: Secondary | ICD-10-CM

## 2015-11-21 DIAGNOSIS — R05 Cough: Secondary | ICD-10-CM

## 2015-11-21 DIAGNOSIS — R1310 Dysphagia, unspecified: Secondary | ICD-10-CM

## 2015-11-21 NOTE — Progress Notes (Signed)
I received a call from her daughter who is a physician stating that her mother finally admitted to having difficulty with coughing while swallowing. She will need to be evaluated with a swallowing study.

## 2015-11-21 NOTE — Telephone Encounter (Signed)
Dr. Redmond School wanted pt set up for Barium Swallow.  Appt is 11/27/15 at 11:30 at [facility] 1st floor Radiology.  [phone].  Pt will be there about 1 hour/ procedure about 30 minutes.  Called Asal and advised her of same.

## 2015-11-27 ENCOUNTER — Telehealth: Payer: Self-pay | Admitting: *Deleted

## 2015-11-27 ENCOUNTER — Ambulatory Visit (HOSPITAL_COMMUNITY)
Admission: RE | Admit: 2015-11-27 | Discharge: 2015-11-27 | Disposition: A | Discharge status: Home / Self Care | Payer: Medicare Other | Source: Ambulatory Visit | Attending: Family Medicine | Admitting: Family Medicine

## 2015-11-27 DIAGNOSIS — R1310 Dysphagia, unspecified: Secondary | ICD-10-CM | POA: Diagnosis present

## 2015-11-27 NOTE — Telephone Encounter (Signed)
Patient called back -- gave results -- she said she would call later towards the end of the week to schedule follow up appointment

## 2015-12-04 ENCOUNTER — Encounter: Payer: Self-pay | Admitting: Family Medicine

## 2015-12-04 ENCOUNTER — Ambulatory Visit (INDEPENDENT_AMBULATORY_CARE_PROVIDER_SITE_OTHER): Payer: Medicare Other | Admitting: Family Medicine

## 2015-12-04 VITALS — BP 130/84 | HR 82 | Wt 198.0 lb

## 2015-12-04 DIAGNOSIS — J309 Allergic rhinitis, unspecified: Secondary | ICD-10-CM | POA: Diagnosis not present

## 2015-12-04 MED ORDER — LORATADINE 10 MG PO TABS: 10.0000 mg | ORAL_TABLET | Freq: Every day | ORAL | Status: DC

## 2015-12-04 MED ORDER — BUDESONIDE 32 MCG/ACT NA SUSP: 2.0000 | Freq: Every day | NASAL | Status: DC

## 2015-12-04 NOTE — Progress Notes (Signed)
   Subjective:    Patient ID: Sarah Howell, female    DOB: January 28, 1941, 75 y.o.   MRN: [id number]  HPI She is here for consult concerning difficulty with cough with swallowing. She apparently has had difficulty with this in the past but in the last week the cough has gone away. She has had difficulty with nasal congestion, rhinorrhea, PND and sneezing. She is taking Zyrtec and Flonase but this does not seem to be working well.   Review of Systems     Objective:   Physical Exam Alert and in no distress otherwise not examined       Assessment & Plan:  Allergic rhinitis, mild - Plan: budesonide (RHINOCORT AQUA) 32 MCG/ACT nasal spray, loratadine (CLARITIN) 10 MG tablet She will switch to Rhinocort and Claritin and let me know how this is doing to help her symptoms. If she has more difficulty with coughing, we will pursue this if we need to.

## 2015-12-04 NOTE — Patient Instructions (Addendum)
Switch to the Rhinocort and Claritin and stop the Flonase and Zyrtec All me in 2 weeks and let me know how you're doing

## 2015-12-07 ENCOUNTER — Other Ambulatory Visit: Payer: Self-pay | Admitting: Medical

## 2015-12-07 ENCOUNTER — Telehealth: Payer: Self-pay | Admitting: Family Medicine

## 2015-12-07 MED ORDER — FLUTICASONE PROPIONATE 50 MCG/ACT NA SUSP: 2.0000 | Freq: Every day | NASAL | Status: DC

## 2015-12-07 NOTE — Telephone Encounter (Signed)
flonase sent instead

## 2015-12-07 NOTE — Telephone Encounter (Signed)
Rcvd note from pharmacy stating that Budesonide 32 mcg nasal spray is not covered by insurance so they need a new prescription

## 2015-12-19 ENCOUNTER — Telehealth: Payer: Self-pay | Admitting: Family Medicine

## 2015-12-19 NOTE — Telephone Encounter (Signed)
Have her try Nasacort which is over-the-counter

## 2015-12-19 NOTE — Telephone Encounter (Signed)
Loren stopped by and states that the rhinocort was over $100 and was not covered with her insurance, and that [place] sent in flonase with after you told her not to take it, she said Flonase don't do her any good, she was wanting something else that is was less expensive. Pt uses [id number] - [place], [address] and can be reached at [phone] and would like a call when you decide what you send in.

## 2015-12-19 NOTE — Telephone Encounter (Signed)
Called pt and advised of same

## 2016-02-14 ENCOUNTER — Other Ambulatory Visit: Payer: Self-pay | Admitting: Family Medicine

## 2016-02-29 ENCOUNTER — Other Ambulatory Visit: Payer: Self-pay

## 2016-02-29 DIAGNOSIS — Z1231 Encounter for screening mammogram for malignant neoplasm of breast: Secondary | ICD-10-CM

## 2016-04-01 ENCOUNTER — Ambulatory Visit
Admission: RE | Admit: 2016-04-01 | Discharge: 2016-04-01 | Disposition: A | Discharge status: Home / Self Care | Payer: Medicare Other | Source: Ambulatory Visit

## 2016-04-01 DIAGNOSIS — Z1231 Encounter for screening mammogram for malignant neoplasm of breast: Secondary | ICD-10-CM

## 2016-04-11 ENCOUNTER — Other Ambulatory Visit: Payer: Self-pay | Admitting: Family Medicine

## 2016-06-03 ENCOUNTER — Encounter: Payer: Self-pay | Admitting: Family Medicine

## 2016-06-03 ENCOUNTER — Other Ambulatory Visit: Payer: Self-pay | Admitting: Family Medicine

## 2016-06-03 ENCOUNTER — Ambulatory Visit (INDEPENDENT_AMBULATORY_CARE_PROVIDER_SITE_OTHER): Payer: Medicare Other | Admitting: Family Medicine

## 2016-06-03 VITALS — BP 132/84 | HR 76 | Ht 59.0 in | Wt 200.0 lb

## 2016-06-03 DIAGNOSIS — Z8669 Personal history of other diseases of the nervous system and sense organs: Secondary | ICD-10-CM | POA: Diagnosis not present

## 2016-06-03 DIAGNOSIS — E669 Obesity, unspecified: Secondary | ICD-10-CM

## 2016-06-03 DIAGNOSIS — E785 Hyperlipidemia, unspecified: Secondary | ICD-10-CM

## 2016-06-03 DIAGNOSIS — J309 Allergic rhinitis, unspecified: Secondary | ICD-10-CM | POA: Diagnosis not present

## 2016-06-03 DIAGNOSIS — I10 Essential (primary) hypertension: Secondary | ICD-10-CM | POA: Diagnosis not present

## 2016-06-03 LAB — CBC WITH DIFFERENTIAL/PLATELET
BASOS PCT: 0 %
Basophils Absolute: 0 cells/uL (ref 0–200)
EOS PCT: 2 %
Eosinophils Absolute: 162 cells/uL (ref 15–500)
HCT: 40.8 % (ref 35.0–45.0)
HEMOGLOBIN: 13.6 g/dL (ref 11.7–15.5)
LYMPHS ABS: 2835 {cells}/uL (ref 850–3900)
Lymphocytes Relative: 35 %
MCH: 32.5 pg (ref 27.0–33.0)
MCHC: 33.3 g/dL (ref 32.0–36.0)
MCV: 97.6 fL (ref 80.0–100.0)
MONO ABS: 567 {cells}/uL (ref 200–950)
MPV: 10.6 fL (ref 7.5–12.5)
Monocytes Relative: 7 %
NEUTROS ABS: 4536 {cells}/uL (ref 1500–7800)
Neutrophils Relative %: 56 %
Platelets: 245 10*3/uL (ref 140–400)
RBC: 4.18 MIL/uL (ref 3.80–5.10)
RDW: 14.9 % (ref 11.0–15.0)
WBC: 8.1 10*3/uL (ref 4.0–10.5)

## 2016-06-03 LAB — COMPREHENSIVE METABOLIC PANEL
ALBUMIN: 4 g/dL (ref 3.6–5.1)
ALK PHOS: 65 U/L (ref 33–130)
ALT: 14 U/L (ref 6–29)
AST: 24 U/L (ref 10–35)
BILIRUBIN TOTAL: 0.8 mg/dL (ref 0.2–1.2)
BUN: 13 mg/dL (ref 7–25)
CO2: 23 mmol/L (ref 20–31)
CREATININE: 0.93 mg/dL (ref 0.60–0.93)
Calcium: 9.7 mg/dL (ref 8.6–10.4)
Chloride: 107 mmol/L (ref 98–110)
Glucose, Bld: 107 mg/dL — ABNORMAL HIGH (ref 65–99)
Potassium: 4.6 mmol/L (ref 3.5–5.3)
SODIUM: 138 mmol/L (ref 135–146)
TOTAL PROTEIN: 8 g/dL (ref 6.1–8.1)

## 2016-06-03 LAB — LIPID PANEL
Cholesterol: 174 mg/dL (ref 125–200)
HDL: 45 mg/dL — ABNORMAL LOW (ref >=46)
LDL CALC: 99 mg/dL (ref <130)
TRIGLYCERIDES: 151 mg/dL — AB (ref <150)
Total CHOL/HDL Ratio: 3.9 Ratio (ref <=5.0)
VLDL: 30 mg/dL (ref <30)

## 2016-06-03 MED ORDER — MONTELUKAST SODIUM 10 MG PO TABS: 10.0000 mg | ORAL_TABLET | Freq: Every day | ORAL | 3 refills | Status: DC

## 2016-06-03 NOTE — Patient Instructions (Signed)
Pay attention to the headache pattern

## 2016-06-03 NOTE — Progress Notes (Signed)
Subjective:   HPI  Sarah Howell is a 75 y.o. female who presents for [place] annual wellness exam as well as medication management  Medical care team includes:  Dr.Whiticker  Dr.Brily     Preventative care: Last ophthalmology visit: ? Last dental visit:? Last colonoscopy:01/08/2003 Last mammogram:04/01/16 Last gynecological exam: 12/17 Last EKG:03/132015 Last labs:01/31/2015  Prior vaccinations: TD or Tdap:? Influenza:07/24/15 Pneumococcal: 21: 10/03/2009 13: 07/19/14 Shingles/Zostavax: 10/28/2012  Advanced directive: Information given Concerns: She does have history of allergic rhinitis and presently is using Flonase as well as Zyrtec but still having difficulty with rhinorrhea and congestion. She also has a previous history of osteopenia and did have a DEXA scan done several years ago. She also has had a colonoscopy by Dr. Collene Mares. She continues on losartan for her blood pressure as well as Pravachol. Her dermatologist does give her finasteride for hair. Her immunizations were reviewed and she does need a tetanus. He does have underlying glaucoma and sees her ophthalmologist regularly.  Reviewed their medical, surgical, family, social, medication, and allergy history and updated chart as appropriate.  Review of Systems Constitutional: -fever, -chills, -sweats, -unexpected weight change, -decreased appetite, -fatigue Allergy: -sneezing, -itching, -congestion Dermatology: -changing moles, --rash, -lumps ENT: -runny nose, -ear pain, -sore throat, -hoarseness, -sinus pain, -teeth pain, - ringing in ears, -hearing loss, -nosebleeds Cardiology: -chest pain, -palpitations, -swelling, -difficulty breathing when lying flat, -waking up short of breath Respiratory: -cough, -shortness of breath, -difficulty breathing with exercise or exertion, -wheezing, -coughing up blood Gastroenterology: -abdominal pain, -nausea, -vomiting, -diarrhea, -constipation, -blood in stool, -changes in bowel movement,  -difficulty swallowing or eating Hematology: -bleeding, -bruising  Musculoskeletal: -joint aches, -muscle aches, -joint swelling, -back pain, -neck pain, -cramping, -changes in gait Ophthalmology: denies vision changes, eye redness, itching, discharge Urology: -burning with urination, -difficulty urinating, -blood in urine, -urinary frequency, -urgency, -incontinence Neurology: -headache, -weakness, -tingling, -numbness, -memory loss, -falls, -dizziness Psychology: -depressed mood, -agitation, -sleep problems     Objective:   Physical Exam  General appearance: alert, no distress, WD/WN,  Skin: Normal  HEENT: normocephalic, conjunctiva/corneas normal, sclerae anicteric, PERRLA, EOMi, nares patent, no discharge or erythema, pharynx normal Oral cavity: MMM, tongue normal, teeth normal Neck: supple, no lymphadenopathy, no thyromegaly, no masses, normal ROM Chest: non tender, normal shape and expansion Heart: RRR, normal S1, S2, no murmurs Lungs: CTA bilaterally, no wheezes, rhonchi, or rales Abdomen: +bs, soft, non tender, non distended, no masses, no hepatomegaly, no splenomegaly, no bruits Back: non tender, normal ROM, no scoliosis Musculoskeletal: upper extremities non tender, no obvious deformity, normal ROM throughout, lower extremities non tender, no obvious deformity, normal ROM throughout Extremities: no edema, no cyanosis, no clubbing Pulses: 2+ symmetric, upper and lower extremities, normal cap refill Neurological: alert, oriented x 3, CN2-12 intact, strength normal upper extremities and lower extremities, sensation normal throughout, DTRs 2+ throughout, no cerebellar signs, gait normal Psychiatric: normal affect, behavior normal, pleasant      Assessment and Plan :   Essential hypertension - Plan: CBC with Differential/Platelet, Comprehensive metabolic panel  Allergic rhinitis, mild - Plan: montelukast (SINGULAIR) 10 MG tablet  History of glaucoma  Hyperlipidemia LDL goal  <100 - Plan: Lipid panel  Obesity Encouraged her to become more physically active. Continue on present medication regimen. Will add Singulair to her regimen. Discussed possibly also adding a nasal antihistamine. She will continue to be seen by her ophthalmologist for her underlying glaucoma.     Physical exam - discussed healthy lifestyle, diet, exercise, preventative care, vaccinations, and  addressed their concerns.  Handout given. Follow-up one year.

## 2016-06-06 LAB — HEMOGLOBIN A1C
HEMOGLOBIN A1C: 5.5 % (ref <5.7)
Mean Plasma Glucose: 111 mg/dL

## 2016-06-26 ENCOUNTER — Other Ambulatory Visit: Payer: Self-pay | Admitting: Family Medicine

## 2016-07-15 ENCOUNTER — Other Ambulatory Visit: Payer: Self-pay | Admitting: Family Medicine

## 2016-08-25 ENCOUNTER — Other Ambulatory Visit (INDEPENDENT_AMBULATORY_CARE_PROVIDER_SITE_OTHER): Payer: Medicare Other

## 2016-08-25 DIAGNOSIS — Z23 Encounter for immunization: Secondary | ICD-10-CM | POA: Diagnosis not present

## 2016-09-23 LAB — HM DEXA SCAN

## 2016-10-03 ENCOUNTER — Other Ambulatory Visit: Payer: Self-pay | Admitting: Medical

## 2016-10-13 ENCOUNTER — Other Ambulatory Visit: Payer: Self-pay | Admitting: Family Medicine

## 2016-11-18 LAB — HM PAP SMEAR: HM PAP: NEGATIVE

## 2016-12-01 ENCOUNTER — Other Ambulatory Visit: Payer: Self-pay | Admitting: Family Medicine

## 2016-12-01 DIAGNOSIS — J309 Allergic rhinitis, unspecified: Secondary | ICD-10-CM

## 2016-12-11 ENCOUNTER — Encounter (HOSPITAL_COMMUNITY): Payer: Self-pay

## 2016-12-12 ENCOUNTER — Encounter (HOSPITAL_COMMUNITY): Payer: Self-pay

## 2017-01-16 IMAGING — RF DG SWALLOWING FUNCTION
1 series · 17 of 24 positions shown · non-contrast
Comparison: None.

CLINICAL DATA: Difficulty swallowing.

EXAM:
MODIFIED BARIUM SWALLOW
TECHNIQUE: Different consistencies of barium were administered orally to the
patient by the Speech Pathologist. Imaging of the pharynx was
performed in the lateral projection.
FLUOROSCOPY TIME:  Radiation Exposure Index (as provided by the
fluoroscopic device):
If the device does not provide the exposure index:
Fluoroscopy Time:  53 seconds
Number of Acquired Images:  0

[Series 1: run · 14 acquisitions, 17 frames shown]
[im 1/14]
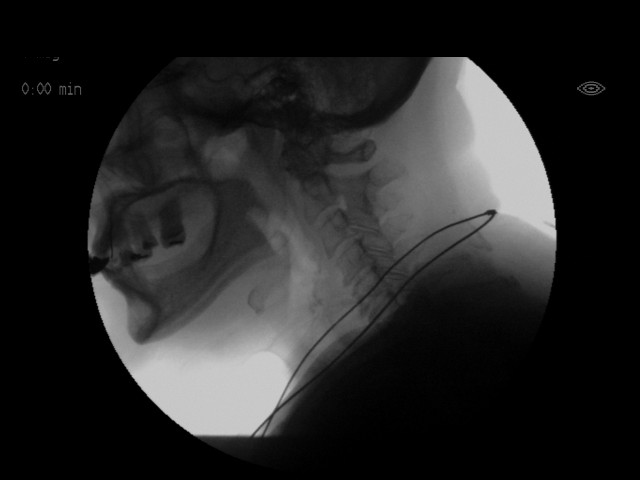
[im 2/14]
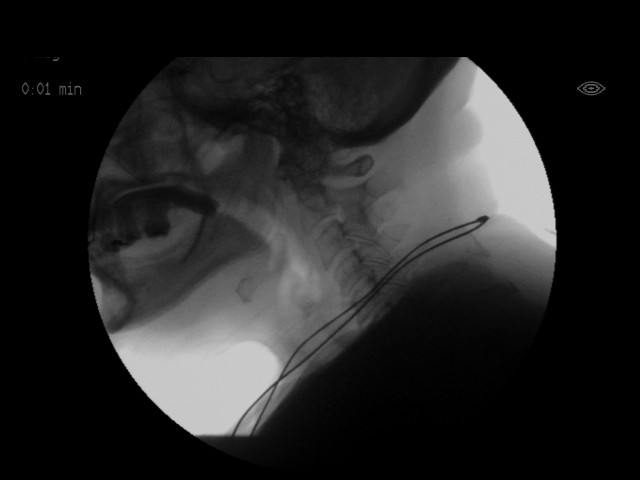
[im 2/14]
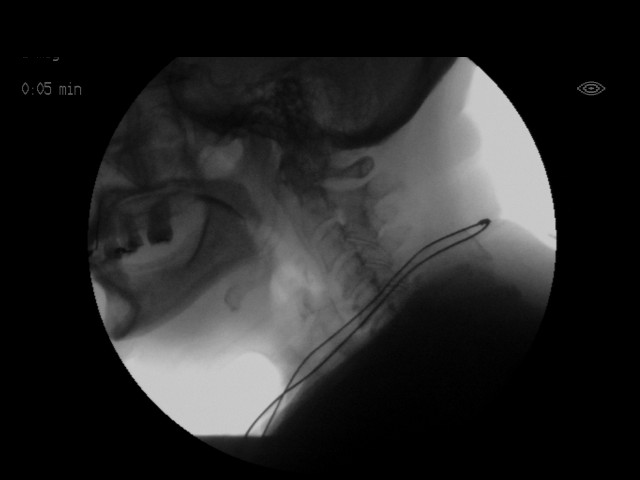
[im 3/14]
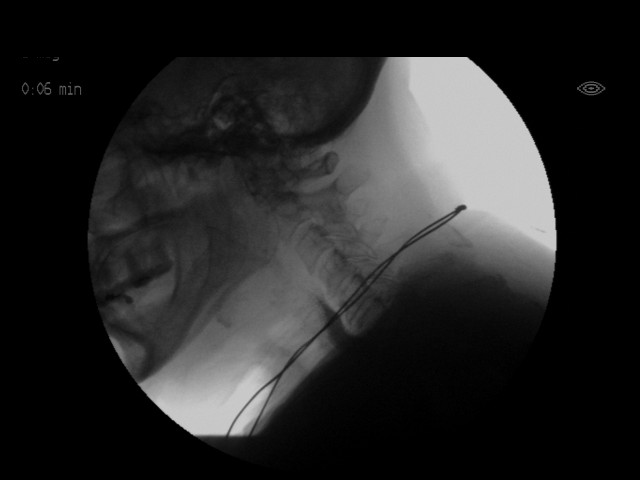
[im 4/14]
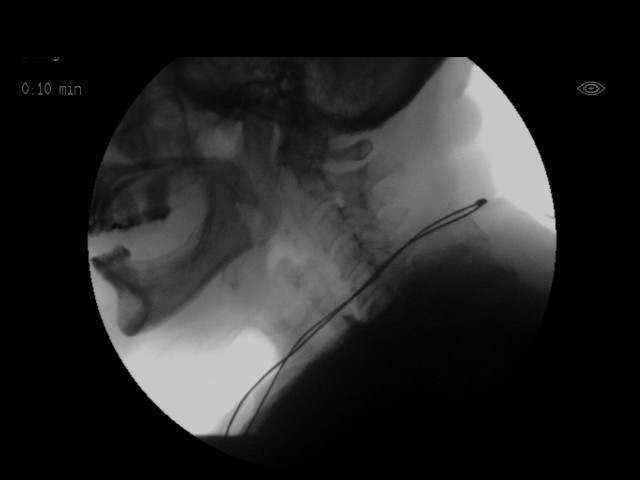
[im 5/14]
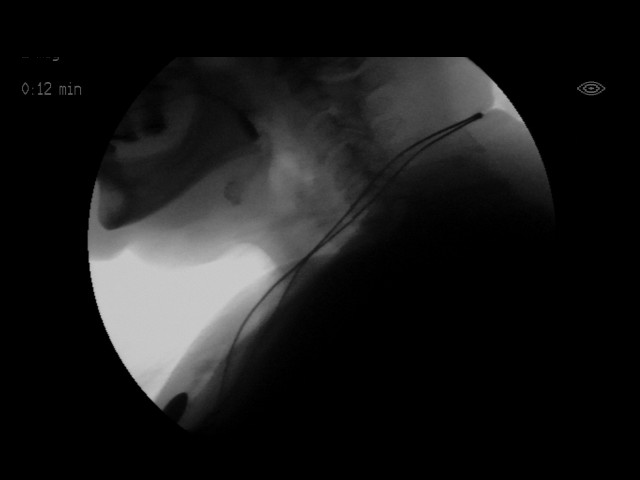
[im 6/14]
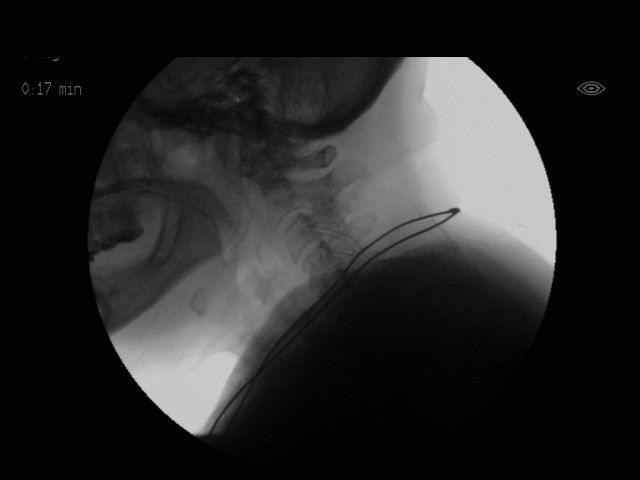
[im 7/14]
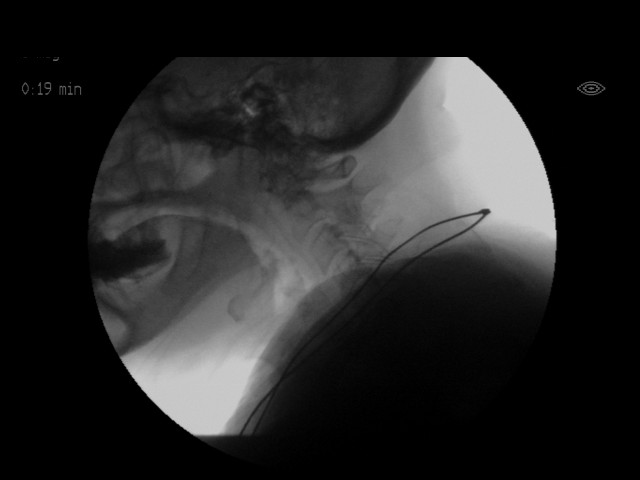
[im 8/14]
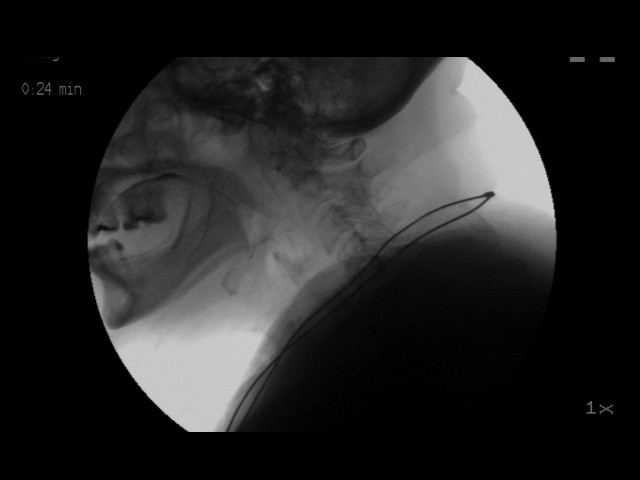
[im 8/14]
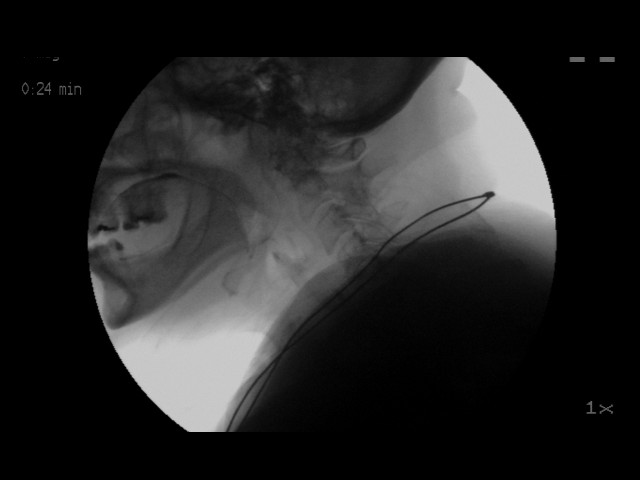
[im 9/14]
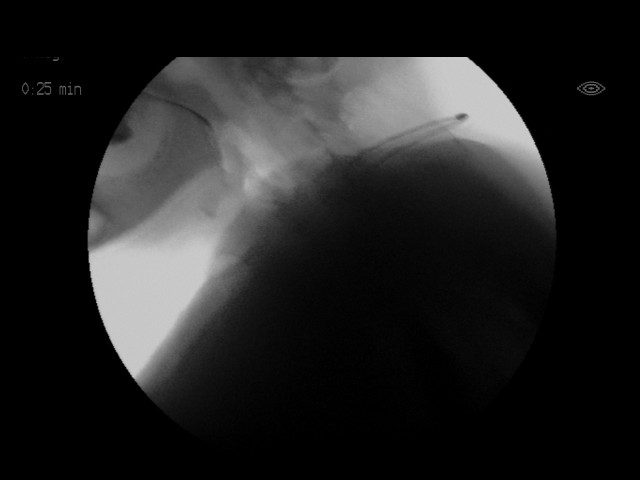
[im 10/14]
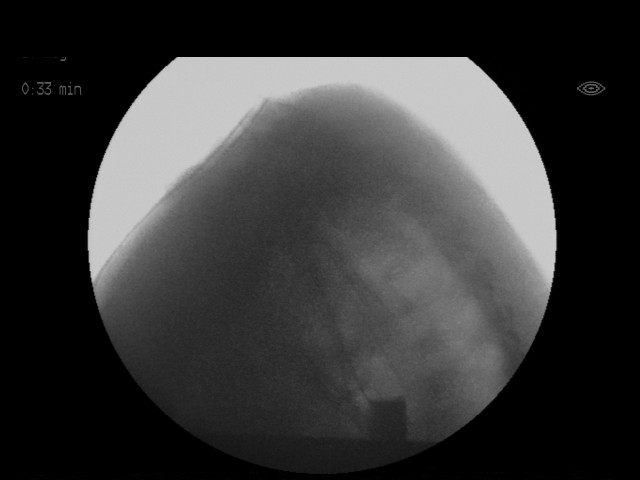
[im 11/14]
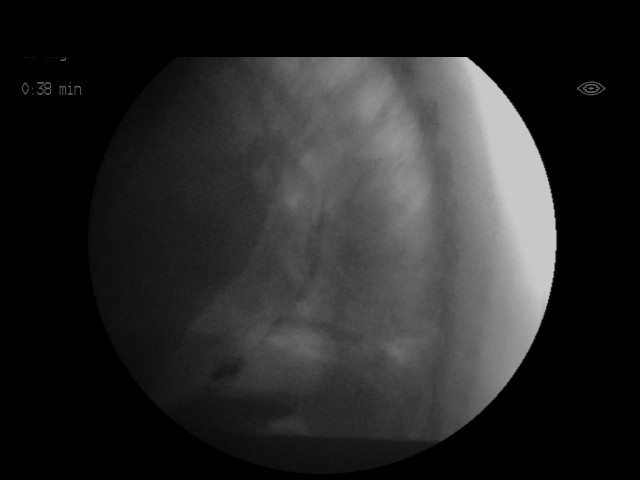
[im 12/14]
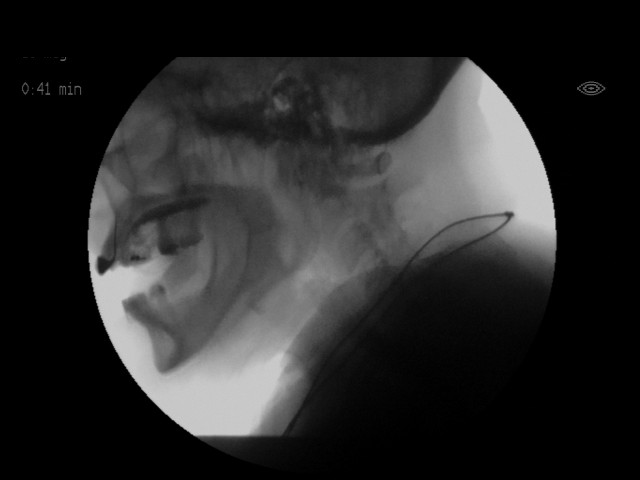
[im 13/14]
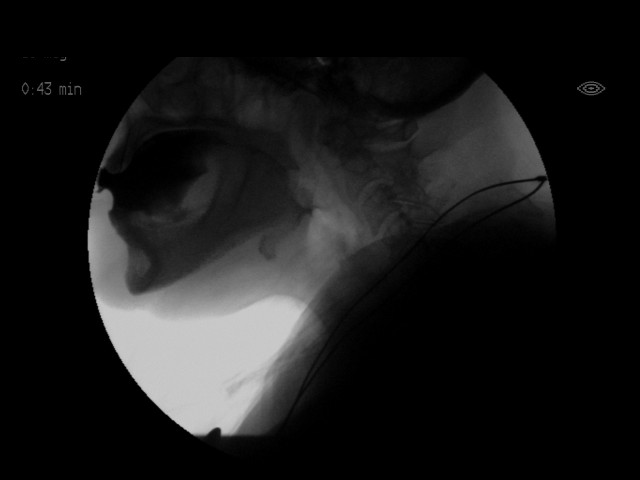
[im 13/14]
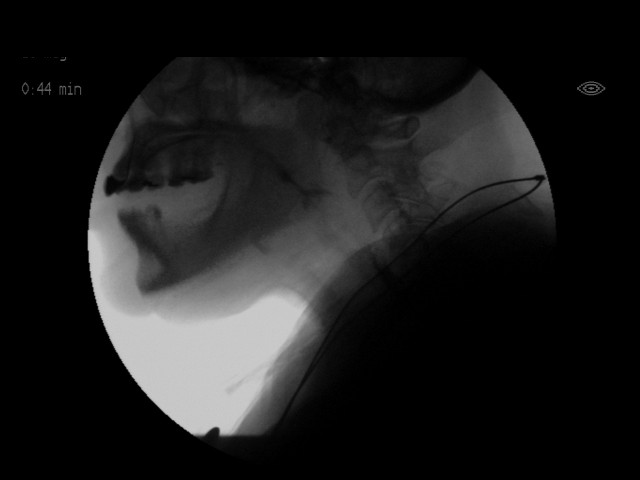
[im 14/14]
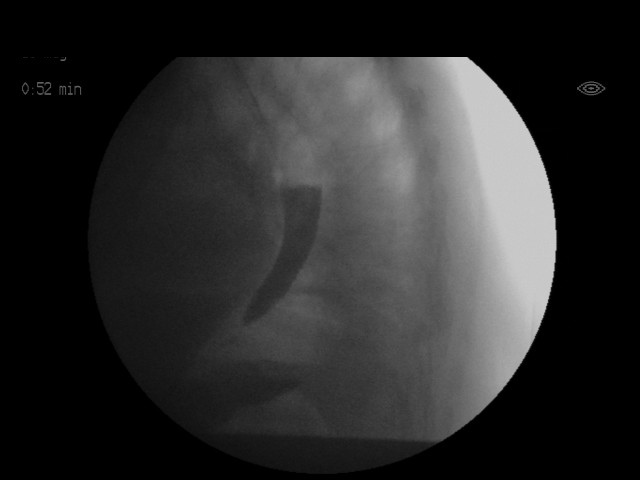

[17 of 24 positions shown; findings below may reference images not displayed]

FINDINGS: Thin liquid- within normal limits

Nectar thick liquid- deferred.

Honey- deferred.

Puree- within normal limits

Cracker-within normal limits

Puree with cracker- deferred.

Barium tablet -  deferred.
IMPRESSION: Negative exam.

Please refer to the Speech Pathologists report for complete details
and recommendations.

## 2017-03-03 ENCOUNTER — Other Ambulatory Visit: Payer: Self-pay | Admitting: Family Medicine

## 2017-03-03 DIAGNOSIS — Z1231 Encounter for screening mammogram for malignant neoplasm of breast: Secondary | ICD-10-CM

## 2017-03-26 ENCOUNTER — Encounter: Payer: Self-pay | Admitting: Genetic Counselor

## 2017-04-02 ENCOUNTER — Ambulatory Visit
Admission: RE | Admit: 2017-04-02 | Discharge: 2017-04-02 | Disposition: A | Discharge status: Home / Self Care | Payer: Medicare Other | Source: Ambulatory Visit | Attending: Family Medicine | Admitting: Family Medicine

## 2017-04-02 DIAGNOSIS — Z1231 Encounter for screening mammogram for malignant neoplasm of breast: Secondary | ICD-10-CM

## 2017-04-23 ENCOUNTER — Other Ambulatory Visit: Payer: Self-pay | Admitting: Family Medicine

## 2017-05-26 ENCOUNTER — Other Ambulatory Visit: Payer: Self-pay | Admitting: Family Medicine

## 2017-05-26 DIAGNOSIS — J309 Allergic rhinitis, unspecified: Secondary | ICD-10-CM

## 2017-05-26 NOTE — Telephone Encounter (Signed)
Is this okay to refill? 

## 2017-06-04 ENCOUNTER — Telehealth: Payer: Self-pay | Admitting: Family Medicine

## 2017-06-04 ENCOUNTER — Ambulatory Visit (INDEPENDENT_AMBULATORY_CARE_PROVIDER_SITE_OTHER): Payer: Medicare Other | Admitting: Family Medicine

## 2017-06-04 ENCOUNTER — Encounter: Payer: Self-pay | Admitting: Family Medicine

## 2017-06-04 VITALS — BP 126/82 | HR 76 | Ht 58.5 in | Wt 201.0 lb

## 2017-06-04 DIAGNOSIS — M8589 Other specified disorders of bone density and structure, multiple sites: Secondary | ICD-10-CM

## 2017-06-04 DIAGNOSIS — E785 Hyperlipidemia, unspecified: Secondary | ICD-10-CM

## 2017-06-04 DIAGNOSIS — Z8639 Personal history of other endocrine, nutritional and metabolic disease: Secondary | ICD-10-CM | POA: Diagnosis not present

## 2017-06-04 DIAGNOSIS — C50912 Malignant neoplasm of unspecified site of left female breast: Secondary | ICD-10-CM

## 2017-06-04 DIAGNOSIS — Z8669 Personal history of other diseases of the nervous system and sense organs: Secondary | ICD-10-CM | POA: Diagnosis not present

## 2017-06-04 DIAGNOSIS — J309 Allergic rhinitis, unspecified: Secondary | ICD-10-CM | POA: Diagnosis not present

## 2017-06-04 DIAGNOSIS — L659 Nonscarring hair loss, unspecified: Secondary | ICD-10-CM

## 2017-06-04 DIAGNOSIS — I10 Essential (primary) hypertension: Secondary | ICD-10-CM

## 2017-06-04 DIAGNOSIS — Z23 Encounter for immunization: Secondary | ICD-10-CM

## 2017-06-04 DIAGNOSIS — Z0000 Encounter for general adult medical examination without abnormal findings: Secondary | ICD-10-CM

## 2017-06-04 LAB — CBC WITH DIFFERENTIAL/PLATELET
Basophils Absolute: 83 cells/uL (ref 0–200)
Basophils Relative: 1 %
EOS PCT: 2 %
Eosinophils Absolute: 166 cells/uL (ref 15–500)
HEMATOCRIT: 41.4 % (ref 35.0–45.0)
HEMOGLOBIN: 13.8 g/dL (ref 11.7–15.5)
LYMPHS ABS: 3154 {cells}/uL (ref 850–3900)
Lymphocytes Relative: 38 %
MCH: 32.2 pg (ref 27.0–33.0)
MCHC: 33.3 g/dL (ref 32.0–36.0)
MCV: 96.7 fL (ref 80.0–100.0)
MPV: 11.2 fL (ref 7.5–12.5)
Monocytes Absolute: 581 cells/uL (ref 200–950)
Monocytes Relative: 7 %
NEUTROS PCT: 52 %
Neutro Abs: 4316 cells/uL (ref 1500–7800)
Platelets: 260 10*3/uL (ref 140–400)
RBC: 4.28 MIL/uL (ref 3.80–5.10)
RDW: 14.7 % (ref 11.0–15.0)
WBC: 8.3 10*3/uL (ref 4.0–10.5)

## 2017-06-04 LAB — LIPID PANEL
Cholesterol: 177 mg/dL (ref <200)
HDL: 45 mg/dL — ABNORMAL LOW (ref >50)
LDL CALC: 104 mg/dL — AB (ref <100)
Total CHOL/HDL Ratio: 3.9 Ratio (ref <5.0)
Triglycerides: 139 mg/dL (ref <150)
VLDL: 28 mg/dL (ref <30)

## 2017-06-04 LAB — COMPREHENSIVE METABOLIC PANEL
ALK PHOS: 75 U/L (ref 33–130)
ALT: 12 U/L (ref 6–29)
AST: 21 U/L (ref 10–35)
Albumin: 4 g/dL (ref 3.6–5.1)
BUN: 14 mg/dL (ref 7–25)
CALCIUM: 9.8 mg/dL (ref 8.6–10.4)
CO2: 20 mmol/L (ref 20–32)
Chloride: 105 mmol/L (ref 98–110)
Creat: 0.88 mg/dL (ref 0.60–0.93)
Glucose, Bld: 100 mg/dL — ABNORMAL HIGH (ref 65–99)
POTASSIUM: 4.3 mmol/L (ref 3.5–5.3)
Sodium: 137 mmol/L (ref 135–146)
TOTAL PROTEIN: 8.1 g/dL (ref 6.1–8.1)
Total Bilirubin: 0.9 mg/dL (ref 0.2–1.2)

## 2017-06-04 NOTE — Progress Notes (Signed)
Subjective:   HPI  Sarah Howell is a 76 y.o. female who presents for Chief Complaint  Patient presents with  . Medicare Wellness  . Annual Exam    Medical care team includes: Denita Lung, MD here for primary care Dr.Cousins obgyn Dr.Brly sur. Dr Venetia Maxon eye Dr.Rankin eye Dr.Mc Roddie [facility] [place]   Preventative care: [employer] Last ophthalmology visit:6/18 Last dental visit: 7/18 Last colonoscopy:12/08/12? Last mammogram:04/02/17 Last pap: 01/31/11 Last EKG:01/06/14 Last labs:06/03/16  Prior vaccinations:  TD or Tdap:06/11/16 Influenza:08/25/16 Pneumococcal:23: 10/03/09,07/19/14 Shingles/Zostavax:10/28/12    Advanced directive: No. Information given. Concerns: She does have underlying allergies that are not under adequate control and will like to add Flonase to this. She continues on Singulair as well as Zyrtec. He also is having some left arm pain and plans to see Dr. Percell Miller for follow-up on that. She is also taking pravastatin for her hyperlipidemia and having no difficulty with this. She gets regular follow-up concerning her remote history of breast cancer. She also sees her dermatologist for treatment of her alopecia and is using finasteride for that. She does have a history of glaucoma and does follow up with ophthalmology concerning that. There is also a remote history of vitamin D deficiency. She also has a history of osteopenia  Reviewed their medical, surgical, family, social, medication, and allergy history and updated chart as appropriate.  Past Medical History:  Diagnosis Date  . Allergy   . Alopecia   . Breast cancer ([place]) 1989   left breast cancer; s/p mastectomy and reconstruction  . Cancer (HCC)    BREAST  . Cough   . Glaucoma    bilateral  . Hyperlipidemia   . Hypertension    dr Demetrios Isaacs    pcp  . Osteopenia   . Shortness of breath    WITH EXERTION     Past Surgical History:  Procedure Laterality Date  . BREAST SURGERY     lft br mastectomy  and reconstruction  . CATARACT EXTRACTION W/PHACO  12/10/2011   Procedure: CATARACT EXTRACTION PHACO AND INTRAOCULAR LENS PLACEMENT (IOC);  Surgeon: Marylynn Pearson, MD;  Location: [place];  Service: Ophthalmology;  Laterality: Right;  . CATARACT EXTRACTION W/PHACO  02/04/2012   Procedure: CATARACT EXTRACTION PHACO AND INTRAOCULAR LENS PLACEMENT (IOC);  Surgeon: Marylynn Pearson, MD;  Location: [place];  Service: Ophthalmology;  Laterality: Left;  . EYE SURGERY Bilateral 2013   cataract surgery  . lt breast ca reconstruction  07/28/1988  . MASTECTOMY Right 1989  . Orthoscopic surgery lt knee  2002  . Orthoscopic surgery rt knee  08/05/2009  . Rotoator cuff cleaning      Social History   Social History  . Marital status: Married    Spouse name: N/A  . Number of children: N/A  . Years of education: N/A   Occupational History  . Not on file.   Social History Main Topics  . Smoking status: Never Smoker  . Smokeless tobacco: Never Used  . Alcohol use No  . Drug use: No  . Sexual activity: Not Currently   Other Topics Concern  . Not on file   Social History Narrative  . No narrative on file    Family History  Problem Relation Age of Onset  . Colon cancer Mother        dx. 66s; "recurrence" at 84  . Prostate cancer Father        dx. 59s  . Pancreatic cancer Brother  dx. 81-82  . Breast cancer Sister 3  . Pancreatic cancer Sister   . Parkinson's disease Brother   . Prostate cancer Son 69  . Cancer Other        unspecified type  . Diabetes Paternal Aunt   . Diabetes Paternal Uncle   . Breast cancer Cousin        dx. <70s  . Breast cancer Cousin        dx. >50     Current Outpatient Prescriptions:  .  Calcium Carbonate-Vitamin D (CALCIUM + D PO), Take 1 tablet by mouth 2 (two) times daily. , Disp: , Rfl:  .  cetirizine (ZYRTEC) 10 MG tablet, Take 10 mg by mouth daily., Disp: , Rfl:  .  cycloSPORINE (RESTASIS) 0.05 % ophthalmic emulsion, 1 drop 2 (two) times daily.,  Disp: , Rfl:  .  dorzolamide-timolol (COSOPT) 22.3-6.8 MG/ML ophthalmic solution, Place 1 drop into both eyes 2 (two) times daily., Disp: , Rfl:  .  finasteride (PROSCAR) 5 MG tablet, Take 0.5 tablets (2.5 mg total) by mouth daily. Patient uses for hair growth., Disp: 30 tablet, Rfl: 0 .  loratadine (CLARITIN) 10 MG tablet, Take 1 tablet (10 mg total) by mouth daily., Disp: 30 tablet, Rfl: 11 .  losartan (COZAAR) 25 MG tablet, TAKE 1 TABLET BY MOUTH DAILY, Disp: 90 tablet, Rfl: 0 .  montelukast (SINGULAIR) 10 MG tablet, TAKE 1 TABLET BY MOUTH AT BEDTIME, Disp: 90 tablet, Rfl: 3 .  Polyethyl Glycol-Propyl Glycol (SYSTANE ULTRA) 0.4-0.3 % SOLN, Place 1 drop into both eyes 3 (three) times daily as needed. dry eyes., Disp: , Rfl:  .  pravastatin (PRAVACHOL) 80 MG tablet, TAKE 1 TABLET BY MOUTH DAILY, Disp: 90 tablet, Rfl: 3 .  fluticasone (FLONASE) 50 MCG/ACT nasal spray, PLACE 2 SPRAYS INTO THE NOSE DAILY. (Patient not taking: Reported on 06/03/2016), Disp: 16 g, Rfl: 11  Allergies  Allergen Reactions  . Fish Allergy Nausea And Vomiting       Review of Systems Negative except as above Objective:   General appearance: alert, no distress, WD/WN, African American female Skin: Normal HEENT: normocephalic, conjunctiva/corneas normal, sclerae anicteric, PERRLA, EOMi, nares patent, no discharge or erythema, pharynx normal Oral cavity: MMM, tongue normal, teeth normal Neck: supple, no lymphadenopathy, no thyromegaly, no masses, normal ROM, no bruits  Heart: RRR, normal S1, S2, no murmurs Lungs: CTA bilaterally, no wheezes, rhonchi, or rales Abdomen: +bs, soft, non tender, non distended, no masses, no hepatomegaly, no splenomegaly, no bruits Back: non tender, normal ROM, no scoliosis Musculoskeletal: upper extremities non tender, no obvious deformity, normal ROM throughout, lower extremities non tender, no obvious deformity, normal ROM throughout Extremities: no edema, no cyanosis, no  clubbing Pulses: 2+ symmetric, upper and lower extremities, normal cap refill Neurological: alert, oriented x 3, CN2-12 intact, strength normal upper extremities and lower extremities, sensation normal throughout, DTRs 2+ throughout, no cerebellar signs, gait normal Psychiatric: normal affect, behavior normal, pleasant      Assessment and Plan :    Routine general medical examination at a health care facility  Malignant neoplasm of left female breast, unspecified estrogen receptor status, unspecified site of breast (HCC)  Allergic rhinitis, mild  Alopecia  History of glaucoma  Hyperlipidemia LDL goal <100 - Plan: Comprehensive metabolic panel, CBC with Differential/Platelet  Essential hypertension - Plan: Comprehensive metabolic panel, CBC with Differential/Platelet  Need for vaccination against Streptococcus pneumoniae - Plan: Pneumococcal conjugate vaccine 13-valent  Morbid obesity ([place]) - Plan: CBC with Differential/Platelet, Lipid  panel  Osteopenia of multiple sites - Plan: VITAMIN D 25 Hydroxy (Vit-D Deficiency, Fractures)  History of vitamin D deficiency - Plan: VITAMIN D 25 Hydroxy (Vit-D Deficiency, Fractures) She is stable on the above diagnoses on the present medication regimen. Did encourage her to become more physically active. She will follow-up with Dr. Percell Miller concerning her arm. We'll follow-up on the vitamin D. She will continue to be followed for her glaucoma and the alopecia.

## 2017-06-04 NOTE — Telephone Encounter (Signed)
Pt requested that lab results be mailed to her when they come in

## 2017-06-05 ENCOUNTER — Encounter: Payer: Self-pay | Admitting: Family Medicine

## 2017-06-05 LAB — VITAMIN D 25 HYDROXY (VIT D DEFICIENCY, FRACTURES): Vit D, 25-Hydroxy: 32 ng/mL (ref 30–100)

## 2017-06-08 ENCOUNTER — Telehealth: Payer: Self-pay

## 2017-06-08 DIAGNOSIS — Z23 Encounter for immunization: Secondary | ICD-10-CM

## 2017-06-08 MED ORDER — FLUTICASONE PROPIONATE 50 MCG/ACT NA SUSP: NASAL | 11 refills | Status: DC

## 2017-06-08 NOTE — Telephone Encounter (Signed)
Patient needs rx of Flonase called to [facility] [address]   Called this in for pt.

## 2017-06-16 ENCOUNTER — Encounter: Payer: Self-pay | Admitting: Family Medicine

## 2017-07-22 ENCOUNTER — Other Ambulatory Visit: Payer: Self-pay | Admitting: Family Medicine

## 2017-07-30 ENCOUNTER — Other Ambulatory Visit (INDEPENDENT_AMBULATORY_CARE_PROVIDER_SITE_OTHER): Payer: Medicare Other

## 2017-07-30 DIAGNOSIS — Z23 Encounter for immunization: Secondary | ICD-10-CM | POA: Diagnosis not present

## 2017-07-31 ENCOUNTER — Other Ambulatory Visit: Payer: Self-pay | Admitting: Family Medicine

## 2017-09-25 ENCOUNTER — Other Ambulatory Visit: Payer: Self-pay | Admitting: Family Medicine

## 2017-10-21 ENCOUNTER — Other Ambulatory Visit: Payer: Self-pay

## 2017-10-21 MED ORDER — LOSARTAN POTASSIUM 25 MG PO TABS: 25.0000 mg | ORAL_TABLET | Freq: Every day | ORAL | 3 refills | Status: DC

## 2017-12-03 ENCOUNTER — Telehealth: Payer: Self-pay | Admitting: Family Medicine

## 2017-12-03 DIAGNOSIS — H20019 Primary iridocyclitis, unspecified eye: Secondary | ICD-10-CM

## 2017-12-03 NOTE — Telephone Encounter (Signed)
Let her know to come by to get the blood drawn.

## 2017-12-03 NOTE — Telephone Encounter (Signed)
Pt came in and dropped off orders for labs requested by her eye doc. Sending info back. Please review, place orders in system if ok and pt needs to be advised at [phone].

## 2017-12-03 NOTE — Telephone Encounter (Signed)
Called pt to let her know that she can come by and have lab work done per Dr. [employer]. . No answer and left message. Also let pt know if she needed to hange the date of the lab work to call the office. Thanks [employer]

## 2017-12-04 ENCOUNTER — Other Ambulatory Visit: Payer: Medicare Other

## 2017-12-04 DIAGNOSIS — H20019 Primary iridocyclitis, unspecified eye: Secondary | ICD-10-CM

## 2017-12-08 ENCOUNTER — Telehealth: Payer: Self-pay

## 2017-12-08 NOTE — Telephone Encounter (Signed)
Pt lab results were sent over to DR. Whitaker eye consult On 12-08-17. Thanks [employer]

## 2017-12-09 LAB — CBC WITH DIFFERENTIAL/PLATELET
BASOS ABS: 0 10*3/uL (ref 0.0–0.2)
Basos: 0 %
EOS (ABSOLUTE): 0.3 10*3/uL (ref 0.0–0.4)
EOS: 3 %
HEMATOCRIT: 38.2 % (ref 34.0–46.6)
Hemoglobin: 12.8 g/dL (ref 11.1–15.9)
Immature Grans (Abs): 0 10*3/uL (ref 0.0–0.1)
Immature Granulocytes: 0 %
LYMPHS ABS: 3.2 10*3/uL — AB (ref 0.7–3.1)
Lymphs: 41 %
MCH: 31.8 pg (ref 26.6–33.0)
MCHC: 33.5 g/dL (ref 31.5–35.7)
MCV: 95 fL (ref 79–97)
MONOS ABS: 0.6 10*3/uL (ref 0.1–0.9)
Monocytes: 8 %
Neutrophils Absolute: 3.6 10*3/uL (ref 1.4–7.0)
Neutrophils: 48 %
PLATELETS: 252 10*3/uL (ref 150–379)
RBC: 4.03 x10E6/uL (ref 3.77–5.28)
RDW: 14.9 % (ref 12.3–15.4)
WBC: 7.7 10*3/uL (ref 3.4–10.8)

## 2017-12-09 LAB — RHEUMATOID FACTOR: RHEUMATOID FACTOR: 10.9 [IU]/mL (ref 0.0–13.9)

## 2017-12-09 LAB — ANGIOTENSIN CONVERTING ENZYME: ANGIO CONVERT ENZYME: 33 U/L (ref 14–82)

## 2017-12-09 LAB — VDRL: NON-TREPONEMAL SCREENING VDRL: NORMAL

## 2017-12-10 LAB — SPECIMEN STATUS REPORT

## 2017-12-10 LAB — RPR QUALITATIVE: RPR: NONREACTIVE

## 2018-01-06 LAB — HM COLONOSCOPY

## 2018-01-12 ENCOUNTER — Encounter: Payer: Self-pay | Admitting: Family Medicine

## 2018-01-19 ENCOUNTER — Telehealth: Payer: Self-pay | Admitting: Family Medicine

## 2018-01-19 ENCOUNTER — Other Ambulatory Visit: Payer: Self-pay

## 2018-01-19 DIAGNOSIS — G478 Other sleep disorders: Secondary | ICD-10-CM

## 2018-01-19 LAB — HM DIABETES EYE EXAM

## 2018-01-19 NOTE — Telephone Encounter (Signed)
Thanks for your help.

## 2018-01-19 NOTE — Telephone Encounter (Signed)
Dr. 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 While the cause is not known, he finds it is often with periodic hypoxia, and often finds it with OSA. He asked her about symptoms, and she reported some frequent awakenings at night, unrefreshed sleep.  No known snoring.  He finds that if sleep apnea is found and treated, that the condition stabilizes. He is requesting that we order a sleep study for the patient.  I've entered sleep study order

## 2018-02-19 ENCOUNTER — Ambulatory Visit (HOSPITAL_BASED_OUTPATIENT_CLINIC_OR_DEPARTMENT_OTHER): Payer: Medicare Other | Admitting: Internal Medicine

## 2018-02-19 VITALS — Ht 60.0 in | Wt 190.0 lb

## 2018-03-03 ENCOUNTER — Other Ambulatory Visit: Payer: Self-pay | Admitting: Family Medicine

## 2018-03-03 DIAGNOSIS — Z1231 Encounter for screening mammogram for malignant neoplasm of breast: Secondary | ICD-10-CM

## 2018-03-19 ENCOUNTER — Ambulatory Visit (HOSPITAL_BASED_OUTPATIENT_CLINIC_OR_DEPARTMENT_OTHER): Payer: Medicare Other | Attending: Family Medicine | Admitting: Internal Medicine

## 2018-03-19 DIAGNOSIS — G4733 Obstructive sleep apnea (adult) (pediatric): Secondary | ICD-10-CM

## 2018-04-03 DIAGNOSIS — G4733 Obstructive sleep apnea (adult) (pediatric): Secondary | ICD-10-CM | POA: Diagnosis not present

## 2018-04-03 NOTE — Procedures (Signed)
   Patient Name: Sarah Howell, Sarah Howell Date: 03/19/2018 Gender: Female D.O.B: May 26, 1941 Age (years): 24 Referring Provider: Rita Ohara Height (inches): 60 Interpreting Physician: Baird Lyons MD, ABSM Weight (lbs): 190 RPSGT: Jacolyn Reedy BMI: 37 MRN: [id number] Neck Size: 15.50  CLINICAL INFORMATION Sleep Study Type: HST Indication for sleep study: OSA  Epworth Sleepiness Score: 6  SLEEP STUDY TECHNIQUE A multi-channel overnight portable sleep study was performed. The channels recorded were: nasal airflow, thoracic respiratory movement, and oxygen saturation with a pulse oximetry. Snoring was also monitored.  MEDICATIONS Patient self administered medications include: none reported.  SLEEP ARCHITECTURE Patient was studied for 461.9 minutes. The sleep efficiency was 99.7 % and the patient was supine for 32.6%. The arousal index was 0.1 per hour.  RESPIRATORY PARAMETERS The overall AHI was 9.1 per hour, with a central apnea index of 0.0 per hour.  The oxygen nadir was 80% during sleep.  CARDIAC DATA Mean heart rate during sleep was 71.3 bpm.  IMPRESSIONS - Mild obstructive sleep apnea occurred during this study (AHI = 9.1/h). - No significant central sleep apnea occurred during this study (CAI = 0.0/h). - Oxygen desaturation was noted during this study (Min O2 = 80%, Mean 89%). - Patient snored.  DIAGNOSIS - Obstructive Sleep Apnea (327.23 [G47.33 ICD-10]) - Nocturnal Hypoxemia (327.26 [G47.36 ICD-10])  RECOMMENDATIONS - Suggest CPAP titration study or DME autopap. Other options would be based on clinical judgment. - Be careful with alcohol, sedatives and other CNS depressants that may worsen sleep apnea and disrupt normal sleep architecture. - Sleep hygiene should be reviewed to assess factors that may improve sleep quality. - Weight management and regular exercise should be initiated or continued.  [Electronically signed] 04/03/2018 10:58 AM  Baird Lyons MD,  ABSM Diplomate, American Board of Sleep Medicine   NPI: [id number]                          [place], [place] [facility]  ELECTRONICALLY SIGNED ON:  04/03/2018, 10:55 AM [place] PH: [phone]   FX: [phone] [place]

## 2018-04-06 ENCOUNTER — Ambulatory Visit
Admission: RE | Admit: 2018-04-06 | Discharge: 2018-04-06 | Disposition: A | Discharge status: Home / Self Care | Payer: Medicare Other | Source: Ambulatory Visit | Attending: Family Medicine | Admitting: Family Medicine

## 2018-04-06 DIAGNOSIS — Z1231 Encounter for screening mammogram for malignant neoplasm of breast: Secondary | ICD-10-CM

## 2018-04-08 ENCOUNTER — Ambulatory Visit: Payer: Medicare Other | Admitting: Family Medicine

## 2018-04-08 ENCOUNTER — Encounter: Payer: Self-pay | Admitting: Family Medicine

## 2018-04-08 VITALS — BP 144/88 | HR 78 | Temp 97.7°F | Wt 203.6 lb

## 2018-04-08 DIAGNOSIS — IMO0002 Reserved for concepts with insufficient information to code with codable children: Secondary | ICD-10-CM

## 2018-04-08 DIAGNOSIS — G4733 Obstructive sleep apnea (adult) (pediatric): Secondary | ICD-10-CM | POA: Diagnosis not present

## 2018-04-08 DIAGNOSIS — G4736 Sleep related hypoventilation in conditions classified elsewhere: Secondary | ICD-10-CM | POA: Diagnosis not present

## 2018-04-08 NOTE — Progress Notes (Signed)
   Subjective:    Patient ID: Sarah Howell, female    DOB: 1941-05-04, 77 y.o.   MRN: [id number]  HPI She is here for consult concerning recent sleep study.  It did show evidence of OSA.   Review of Systems     Objective:   Physical Exam Alert and in no distress otherwise not examined      Assessment & Plan:  OSA (obstructive sleep apnea) - Plan: For home use only DME continuous positive airway pressure (CPAP)  Sleep-related hypoventilation I discussed the diagnosis with her in regard to using a CPAP.  She will be placed on auto titrate and then when she is comfortable on this, we will do an overnight oximetry to assure if she is doing well with that.  Also discussed sleep hygiene issues with her.  She will stop taking the Zyrtec and use a different antihistamine.  She is on no other medications.  Discussed the benefit of weight reduction.  Will refer to nutritionist at her discretion to help with that.

## 2018-04-11 ENCOUNTER — Other Ambulatory Visit: Payer: Self-pay | Admitting: Family Medicine

## 2018-04-12 ENCOUNTER — Telehealth: Payer: Self-pay

## 2018-04-12 NOTE — Telephone Encounter (Signed)
[facility] is requesting that her chart note need to be updated showing why she need the sleep study. Pt was advised by dr Zadie Rhine to contact our office to have a sleep study. I have looked to see in notes and I have  found nothing that says why she had the sleep study . Please advise [facility]

## 2018-04-13 ENCOUNTER — Other Ambulatory Visit: Payer: Self-pay

## 2018-04-13 NOTE — Telephone Encounter (Signed)
Faxed over ofice note on sleep study for pt. [place]

## 2018-04-22 ENCOUNTER — Other Ambulatory Visit: Payer: Self-pay | Admitting: Family Medicine

## 2018-04-22 DIAGNOSIS — Z23 Encounter for immunization: Secondary | ICD-10-CM

## 2018-06-04 ENCOUNTER — Encounter: Payer: Self-pay | Admitting: Family Medicine

## 2018-06-08 ENCOUNTER — Encounter: Payer: Self-pay | Admitting: Family Medicine

## 2018-06-08 ENCOUNTER — Ambulatory Visit: Payer: Medicare Other | Admitting: Family Medicine

## 2018-06-08 VITALS — BP 136/88 | HR 80 | Temp 98.0°F | Wt 202.8 lb

## 2018-06-08 DIAGNOSIS — N3000 Acute cystitis without hematuria: Secondary | ICD-10-CM | POA: Diagnosis not present

## 2018-06-08 LAB — POCT URINALYSIS DIP (PROADVANTAGE DEVICE)
BILIRUBIN UA: NEGATIVE mg/dL
Bilirubin, UA: NEGATIVE
Glucose, UA: NEGATIVE mg/dL
NITRITE UA: NEGATIVE
PH UA: 6 (ref 5.0–8.0)
Specific Gravity, Urine: 1.02
UUROB: 3.5

## 2018-06-08 MED ORDER — SULFAMETHOXAZOLE-TRIMETHOPRIM 800-160 MG PO TABS: 1.0000 | ORAL_TABLET | Freq: Two times a day (BID) | ORAL | 0 refills | Status: DC

## 2018-06-08 NOTE — Progress Notes (Signed)
   Subjective:    Patient ID: Sarah Howell, female    DOB: 1941/07/19, 77 y.o.   MRN: [id number]  HPI She complains of a 3-week history of frequency and dysuria with some incontinence.  No fever, chills, lower abdominal pain.   Review of Systems     Objective:   Physical Exam Alert and in no distress.  Urine microscopic and dipstick was positive for Nimmons cells.       Assessment & Plan:  Acute cystitis without hematuria - Plan: sulfamethoxazole-trimethoprim (BACTRIM DS,SEPTRA DS) 800-160 MG tablet Instructed her to also use Azo-Standard to help with that.  She will return here for a wellness visit in the near future peer

## 2018-06-08 NOTE — Patient Instructions (Signed)
When you get the antibiotic get some medicine called Azo-Standard.

## 2018-06-08 NOTE — Addendum Note (Signed)
Addended by: Elyse Jarvis on: 06/08/2018 03:39 PM   Modules accepted: Orders

## 2018-06-17 ENCOUNTER — Encounter: Payer: Self-pay | Admitting: Family Medicine

## 2018-06-17 ENCOUNTER — Ambulatory Visit: Payer: Medicare Other | Admitting: Family Medicine

## 2018-06-17 VITALS — BP 130/70 | HR 68 | Temp 97.8°F | Ht 60.0 in | Wt 199.4 lb

## 2018-06-17 DIAGNOSIS — Z8669 Personal history of other diseases of the nervous system and sense organs: Secondary | ICD-10-CM

## 2018-06-17 DIAGNOSIS — J309 Allergic rhinitis, unspecified: Secondary | ICD-10-CM

## 2018-06-17 DIAGNOSIS — L659 Nonscarring hair loss, unspecified: Secondary | ICD-10-CM | POA: Diagnosis not present

## 2018-06-17 DIAGNOSIS — M8589 Other specified disorders of bone density and structure, multiple sites: Secondary | ICD-10-CM

## 2018-06-17 DIAGNOSIS — E785 Hyperlipidemia, unspecified: Secondary | ICD-10-CM

## 2018-06-17 DIAGNOSIS — N3000 Acute cystitis without hematuria: Secondary | ICD-10-CM | POA: Diagnosis not present

## 2018-06-17 DIAGNOSIS — C50912 Malignant neoplasm of unspecified site of left female breast: Secondary | ICD-10-CM

## 2018-06-17 DIAGNOSIS — G4733 Obstructive sleep apnea (adult) (pediatric): Secondary | ICD-10-CM

## 2018-06-17 DIAGNOSIS — I10 Essential (primary) hypertension: Secondary | ICD-10-CM

## 2018-06-17 LAB — POCT URINALYSIS DIPSTICK
Glucose, UA: NEGATIVE
Leukocytes, UA: NEGATIVE
PROTEIN UA: NEGATIVE
pH, UA: 6 (ref 5.0–8.0)

## 2018-06-17 NOTE — Progress Notes (Signed)
Sarah Howell is a 77 y.o. female who presents for annual wellness visit and follow-up on chronic medical conditions.  She does have OSA and is getting a CPAP.  She has got quite been using it for 30 days.  Her urinary incontinence has improved since he started treating the UTI.  She does have a history of glaucoma and follows up regularly with her ophthalmologist.  She also sees her dermatologist on a yearly basis.  She continues on pravastatin for her underlying hyperlipidemia.  Her allergies seem to be under good control.  She has a remote history of breast cancer and does get yearly mammograms.  Her exercise is quite minimal.  She does have a history of osteopenia and is using a multivitamin 12 vitamin D and calcium. Immunizations and Health Maintenance Immunization History  Administered Date(s) Administered  . Influenza Split 09/09/2012  . Influenza Whole 10/03/2009  . Influenza, High Dose Seasonal PF 07/19/2014, 07/24/2015, 08/25/2016, 07/30/2017  . Influenza,inj,Quad PF,6+ Mos 07/21/2013  . Pneumococcal Conjugate-13 06/04/2017  . Pneumococcal Polysaccharide-23 10/03/2009, 07/19/2014  . Tdap 06/11/2016  . Zoster 10/28/2012  . Zoster Recombinat (Shingrix) 11/13/2017   Health Maintenance Due  Topic Date Due  . INFLUENZA VACCINE  05/27/2018    Last Pap smear:N/A Last mammogram:May 2019 Last colonoscopy: Last DEXA:2017 Dentist:Sto[facility] Ophtho: Lanell Matar Exercise:walking daily Other doctors caring for patient include:Mann,Byerly,Karb,Mihael  Advanced directives:no onfo given    Depression screen:  See questionnaire below.  Depression screen [facility] 2/9 06/17/2018 06/08/2018 06/04/2017 01/31/2015  Decreased Interest 0 0 0 0  Down, Depressed, Hopeless 0 0 0 0  PHQ - 2 Score 0 0 0 0    Fall Risk Screen: see questionnaire below. Fall Risk  06/17/2018 06/08/2018 06/04/2017 01/31/2015  Falls in the past year? No No No No    ADL screen:  See questionnaire below Functional Status Survey: Is the  patient deaf or have difficulty hearing?: No Does the patient have difficulty seeing, even when wearing glasses/contacts?: No Does the patient have difficulty concentrating, remembering, or making decisions?: No Does the patient have difficulty walking or climbing stairs?: No Does the patient have difficulty dressing or bathing?: No Does the patient have difficulty doing errands alone such as visiting a doctor's office or shopping?: No   Review of Systems Constitutional: -, -unexpected weight change, -anorexia, -fatigue Allergy: -sneezing, -itching, -congestion Dermatology: denies changing moles, rash, lumps ENT: -runny nose, -ear pain, -sore throat,  Cardiology:  -chest pain, -palpitations, -orthopnea, Respiratory: -cough, -shortness of breath, -dyspnea on exertion, -wheezing,  Gastroenterology: -abdominal pain, -nausea, -vomiting, -diarrhea, -constipation, -dysphagia Hematology: -bleeding or bruising problems Musculoskeletal: -arthralgias, -myalgias, -joint swelling, -back pain, - Ophthalmology: -vision changes,  Urology: -dysuria, -difficulty urinating,  -urinary frequency, -urgency, incontinence Neurology: -, -numbness, , -memory loss, -falls, -dizziness    PHYSICAL EXAM:  BP 130/70   Pulse 68   Temp 97.8 F (36.6 C) (Oral)   Ht 5' (1.524 m)   Wt 199 lb 6.4 oz (90.4 kg)   SpO2 98%   BMI 38.94 kg/m   General Appearance: Alert, cooperative, no distress, appears stated age Head: Normocephalic, without obvious abnormality, atraumatic Eyes: PERRL, conjunctiva/corneas clear, EOM's intact, fundi benign Ears: Normal TM's and external ear canals Nose: Nares normal, mucosa normal, no drainage or sinus tenderness Throat: Lips, mucosa, and tongue normal; teeth and gums normal Neck: Supple, no lymphadenopathy;  thyroid:  no enlargement/tenderness/nodules; no carotid bruit or JVD Lungs: Clear to auscultation bilaterally without wheezes, rales or ronchi; respirations unlabored Heart:  Regular rate and rhythm, S1 and S2 normal, no murmur, rubor gallop Abdomen: Soft, non-tender, nondistended, normoactive bowel sounds,  no masses, no hepatosplenomegaly Extremities: No clubbing, cyanosis or edema Pulses: 2+ and symmetric all extremities Skin:  Skin color, texture, turgor normal, no rashes or lesions Lymph nodes: Cervical, supraclavicular, and axillary nodes normal Neurologic:  CNII-XII intact, normal strength, sensation and gait; reflexes 2+ and symmetric throughout Psych: Normal mood, affect, hygiene and grooming.  ASSESSMENT/PLAN: BP 130/70   Pulse 68   Temp 97.8 F (36.6 C) (Oral)   Ht 5' (1.524 m)   Wt 199 lb 6.4 oz (90.4 kg)   SpO2 98%   BMI 38.94 kg/m   General Appearance:    Alert, cooperative, no distress, appears stated age  Head:    Normocephalic, without obvious abnormality, atraumatic  Eyes:    PERRL, conjunctiva/corneas clear, EOM's intact, fundi    benign  Ears:    Normal TM's and external ear canals  Nose:   Nares normal, mucosa normal, no drainage or sinus   tenderness  Throat:   Lips, mucosa, and tongue normal; teeth and gums normal  Neck:   Supple, no lymphadenopathy;  thyroid:  no   enlargement/tenderness/nodules; no carotid   bruit or JVD     Lungs:     Clear to auscultation bilaterally without wheezes, rales or     ronchi; respirations unlabored      Heart:    Regular rate and rhythm, S1 and S2 normal, no murmur, rub   or gallop  Breast Exam:    Deferred to GYN  Abdomen:     Soft, non-tender, nondistended, normoactive bowel sounds,    no masses, no hepatosplenomegaly  Genitalia:    Deferred to GYN     Extremities:   No clubbing, cyanosis or edema  Pulses:   2+ and symmetric all extremities  Skin:   Skin color, texture, turgor normal, no rashes or lesions  Lymph nodes:   Cervical, supraclavicular, and axillary nodes normal  Neurologic:   CNII-XII intact, normal strength, sensation and gait; reflexes 2+ and symmetric throughout           Psych:   Normal mood, affect, hygiene and grooming  OSA (obstructive sleep apnea)  Hyperlipidemia LDL goal <100 - Plan: Comprehensive metabolic panel, Lipid panel  History of glaucoma  Alopecia - Plan: Comprehensive metabolic panel  Allergic rhinitis, mild  Malignant 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  Colonoscopy recommendations reviewed   Medicare Attestation I have personally reviewed: The patient's medical and social history Their use of alcohol, tobacco or illicit drugs Their current medications and supplements The patient's functional ability including ADLs,fall risks, home safety risks, cognitive, and hearing and visual impairment Diet and physical activities Evidence for depression or mood disorders  The patient's weight, height, and BMI have been recorded in the chart.  I have made referrals, counseling, and provided education to the patient based on review of the above and I have provided the patient with a written personalized  care plan for preventive services.     Jill Alexanders, MD   06/17/2018

## 2018-06-18 LAB — COMPREHENSIVE METABOLIC PANEL
A/G RATIO: 1.1 — AB (ref 1.2–2.2)
ALBUMIN: 4.5 g/dL (ref 3.5–4.8)
ALT: 9 IU/L (ref 0–32)
AST: 22 IU/L (ref 0–40)
Alkaline Phosphatase: 70 IU/L (ref 39–117)
BILIRUBIN TOTAL: 0.7 mg/dL (ref 0.0–1.2)
BUN / CREAT RATIO: 13 (ref 12–28)
BUN: 17 mg/dL (ref 8–27)
CHLORIDE: 101 mmol/L (ref 96–106)
CO2: 21 mmol/L (ref 20–29)
Calcium: 10 mg/dL (ref 8.7–10.3)
Creatinine, Ser: 1.31 mg/dL — ABNORMAL HIGH (ref 0.57–1.00)
GFR calc non Af Amer: 39 mL/min/{1.73_m2} — ABNORMAL LOW (ref >59)
GFR, EST AFRICAN AMERICAN: 45 mL/min/{1.73_m2} — AB (ref >59)
Globulin, Total: 4.2 g/dL (ref 1.5–4.5)
Glucose: 102 mg/dL — ABNORMAL HIGH (ref 65–99)
POTASSIUM: 4.9 mmol/L (ref 3.5–5.2)
Sodium: 137 mmol/L (ref 134–144)
TOTAL PROTEIN: 8.7 g/dL — AB (ref 6.0–8.5)

## 2018-06-18 LAB — LIPID PANEL
Chol/HDL Ratio: 3.9 ratio (ref 0.0–4.4)
Cholesterol, Total: 158 mg/dL (ref 100–199)
HDL: 41 mg/dL (ref >39)
LDL Calculated: 92 mg/dL (ref 0–99)
Triglycerides: 124 mg/dL (ref 0–149)
VLDL Cholesterol Cal: 25 mg/dL (ref 5–40)

## 2018-07-02 ENCOUNTER — Other Ambulatory Visit: Payer: Self-pay | Admitting: Family Medicine

## 2018-07-02 DIAGNOSIS — J309 Allergic rhinitis, unspecified: Secondary | ICD-10-CM

## 2018-07-15 ENCOUNTER — Other Ambulatory Visit (INDEPENDENT_AMBULATORY_CARE_PROVIDER_SITE_OTHER): Payer: Medicare Other

## 2018-07-15 DIAGNOSIS — Z23 Encounter for immunization: Secondary | ICD-10-CM | POA: Diagnosis not present

## 2018-07-31 ENCOUNTER — Other Ambulatory Visit: Payer: Self-pay | Admitting: Family Medicine

## 2018-09-07 LAB — HM DEXA SCAN

## 2018-09-10 ENCOUNTER — Telehealth: Payer: Self-pay

## 2018-09-10 NOTE — Telephone Encounter (Signed)
Pt was advised of dexa scan . Sarah Howell

## 2018-11-06 ENCOUNTER — Other Ambulatory Visit: Payer: Self-pay | Admitting: Family Medicine

## 2018-11-09 ENCOUNTER — Other Ambulatory Visit: Payer: Self-pay | Admitting: Family Medicine

## 2018-11-22 ENCOUNTER — Other Ambulatory Visit: Payer: Self-pay | Admitting: Family Medicine

## 2018-11-22 DIAGNOSIS — Z23 Encounter for immunization: Secondary | ICD-10-CM

## 2018-11-22 NOTE — Telephone Encounter (Signed)
[facility]. DX is needed before it can be filled. [place]

## 2019-02-08 ENCOUNTER — Other Ambulatory Visit: Payer: Self-pay | Admitting: Family Medicine

## 2019-02-09 ENCOUNTER — Other Ambulatory Visit: Payer: Self-pay | Admitting: Family Medicine

## 2019-03-04 ENCOUNTER — Other Ambulatory Visit: Payer: Self-pay | Admitting: Family Medicine

## 2019-03-04 DIAGNOSIS — Z1231 Encounter for screening mammogram for malignant neoplasm of breast: Secondary | ICD-10-CM

## 2019-04-27 ENCOUNTER — Ambulatory Visit
Admission: RE | Admit: 2019-04-27 | Discharge: 2019-04-27 | Disposition: A | Discharge status: Home / Self Care | Payer: Medicare Other | Source: Ambulatory Visit | Attending: Family Medicine | Admitting: Family Medicine

## 2019-04-27 ENCOUNTER — Encounter: Payer: Self-pay | Admitting: Diagnostic Radiology

## 2019-04-27 ENCOUNTER — Other Ambulatory Visit: Payer: Self-pay

## 2019-04-27 DIAGNOSIS — Z1231 Encounter for screening mammogram for malignant neoplasm of breast: Secondary | ICD-10-CM

## 2019-04-28 ENCOUNTER — Other Ambulatory Visit: Payer: Self-pay | Admitting: Family Medicine

## 2019-04-28 DIAGNOSIS — R928 Other abnormal and inconclusive findings on diagnostic imaging of breast: Secondary | ICD-10-CM

## 2019-05-03 ENCOUNTER — Ambulatory Visit
Admission: RE | Admit: 2019-05-03 | Discharge: 2019-05-03 | Disposition: A | Discharge status: Home / Self Care | Payer: Medicare Other | Source: Ambulatory Visit | Attending: Family Medicine | Admitting: Family Medicine

## 2019-05-03 ENCOUNTER — Other Ambulatory Visit: Payer: Self-pay | Admitting: Family Medicine

## 2019-05-03 ENCOUNTER — Other Ambulatory Visit: Payer: Self-pay

## 2019-05-03 DIAGNOSIS — R599 Enlarged lymph nodes, unspecified: Secondary | ICD-10-CM

## 2019-05-03 DIAGNOSIS — R928 Other abnormal and inconclusive findings on diagnostic imaging of breast: Secondary | ICD-10-CM

## 2019-05-10 ENCOUNTER — Other Ambulatory Visit: Payer: Medicare Other

## 2019-05-17 ENCOUNTER — Other Ambulatory Visit (HOSPITAL_COMMUNITY): Payer: Self-pay | Admitting: Diagnostic Radiology

## 2019-05-17 ENCOUNTER — Other Ambulatory Visit: Payer: Self-pay

## 2019-05-17 ENCOUNTER — Ambulatory Visit
Admission: RE | Admit: 2019-05-17 | Discharge: 2019-05-17 | Disposition: A | Discharge status: Home / Self Care | Payer: Medicare Other | Source: Ambulatory Visit | Attending: Family Medicine | Admitting: Family Medicine

## 2019-05-17 ENCOUNTER — Other Ambulatory Visit (HOSPITAL_COMMUNITY)
Admission: RE | Admit: 2019-05-17 | Disposition: A | Discharge status: Home / Self Care | Payer: Medicare Other | Source: Ambulatory Visit | Attending: Family Medicine | Admitting: Family Medicine

## 2019-05-17 DIAGNOSIS — R599 Enlarged lymph nodes, unspecified: Secondary | ICD-10-CM

## 2019-05-19 ENCOUNTER — Other Ambulatory Visit: Payer: Self-pay | Admitting: Family Medicine

## 2019-07-08 ENCOUNTER — Other Ambulatory Visit (INDEPENDENT_AMBULATORY_CARE_PROVIDER_SITE_OTHER): Payer: Medicare Other

## 2019-07-08 ENCOUNTER — Other Ambulatory Visit: Payer: Self-pay

## 2019-07-08 DIAGNOSIS — Z23 Encounter for immunization: Secondary | ICD-10-CM | POA: Diagnosis not present

## 2019-08-18 ENCOUNTER — Other Ambulatory Visit: Payer: Self-pay | Admitting: Family Medicine

## 2019-09-08 ENCOUNTER — Other Ambulatory Visit: Payer: Self-pay | Admitting: Family Medicine

## 2019-09-08 DIAGNOSIS — J309 Allergic rhinitis, unspecified: Secondary | ICD-10-CM

## 2019-10-04 ENCOUNTER — Ambulatory Visit: Payer: Medicare Other | Admitting: Family Medicine

## 2019-10-20 ENCOUNTER — Encounter: Payer: Self-pay | Admitting: Family Medicine

## 2019-10-20 ENCOUNTER — Other Ambulatory Visit: Payer: Self-pay

## 2019-10-20 ENCOUNTER — Ambulatory Visit (INDEPENDENT_AMBULATORY_CARE_PROVIDER_SITE_OTHER): Payer: Medicare Other | Admitting: Family Medicine

## 2019-10-20 VITALS — BP 130/82 | HR 85 | Ht 58.5 in | Wt 204.6 lb

## 2019-10-20 DIAGNOSIS — E785 Hyperlipidemia, unspecified: Secondary | ICD-10-CM | POA: Diagnosis not present

## 2019-10-20 DIAGNOSIS — L659 Nonscarring hair loss, unspecified: Secondary | ICD-10-CM

## 2019-10-20 DIAGNOSIS — J309 Allergic rhinitis, unspecified: Secondary | ICD-10-CM

## 2019-10-20 DIAGNOSIS — I10 Essential (primary) hypertension: Secondary | ICD-10-CM | POA: Diagnosis not present

## 2019-10-20 DIAGNOSIS — Z853 Personal history of malignant neoplasm of breast: Secondary | ICD-10-CM

## 2019-10-20 DIAGNOSIS — Z0000 Encounter for general adult medical examination without abnormal findings: Secondary | ICD-10-CM

## 2019-10-20 DIAGNOSIS — Z8669 Personal history of other diseases of the nervous system and sense organs: Secondary | ICD-10-CM | POA: Diagnosis not present

## 2019-10-20 DIAGNOSIS — M8589 Other specified disorders of bone density and structure, multiple sites: Secondary | ICD-10-CM

## 2019-10-20 DIAGNOSIS — G4733 Obstructive sleep apnea (adult) (pediatric): Secondary | ICD-10-CM

## 2019-10-20 MED ORDER — LOSARTAN POTASSIUM 25 MG PO TABS: 25.0000 mg | ORAL_TABLET | Freq: Every day | ORAL | 3 refills | Status: DC

## 2019-10-20 MED ORDER — MONTELUKAST SODIUM 10 MG PO TABS: ORAL_TABLET | ORAL | 3 refills | Status: DC

## 2019-10-20 MED ORDER — PRAVASTATIN SODIUM 80 MG PO TABS: 80.0000 mg | ORAL_TABLET | Freq: Every day | ORAL | 3 refills | Status: DC

## 2019-10-20 NOTE — Patient Instructions (Addendum)
  Sarah Howell , Thank you for taking time to come for your Medicare Wellness Visit. I appreciate your ongoing commitment to your health goals. Please review the following plan we discussed and let me know if I can assist you in the future.   These are the goals we discussed: Continue on present medications and keep working on diet and exercise. This is a list of the screening recommended for you and due dates:  Health Maintenance  Topic Date Due  . Tetanus Vaccine  06/11/2026  . Flu Shot  Completed  . DEXA scan (bone density measurement)  Completed  . Pneumonia vaccines  Completed

## 2019-10-20 NOTE — Progress Notes (Signed)
Sarah Howell is a 78 y.o. female who presents for annual wellness visit ,CPE and follow-up on chronic medical conditions.  She has no particular concerns or complaints.  Her allergies are under good control on Singulair as well as Flonase.  She has a previous history of breast cancer and did have a biopsy of the lymph node recently which was negative.  She does have osteopenia and has had a DEXA scan last year.  She is taking vitamin D and calcium.  She is doing well on her CPAP for OSA.  This was recommended by her ophthalmologist.  She continues on losartan and having no problems with that for her blood pressure.  She is also taking Pravachol without any aches or pains.  She sees a dermatologist for her baldness and is using finasteride.  Family and social history was reviewed.:  Immunizations and Health Maintenance Immunization History  Administered Date(s) Administered  . Fluad Quad(high Dose 65+) 07/08/2019  . Influenza Split 09/09/2012  . Influenza Whole 10/03/2009  . Influenza, High Dose Seasonal PF 07/19/2014, 07/24/2015, 08/25/2016, 07/30/2017, 07/15/2018  . Influenza,inj,Quad PF,6+ Mos 07/21/2013  . Pneumococcal Conjugate-13 06/04/2017  . Pneumococcal Polysaccharide-23 10/03/2009, 07/19/2014  . Tdap 06/11/2016  . Zoster 10/28/2012  . Zoster Recombinat (Shingrix) 06/26/2017, 11/13/2017   There are no preventive care reminders to display for this patient.  Last Pap smear: 2018 Last mammogram: 2020 Last colonoscopy: 2019 Last DEXA: 2019 Dentist: Dr. Gaetano Net-  Ophtho: Dr. Venetia Maxon and Dr. Zadie Rhine Exercise: steps  Other doctors caring for patient include:  Dr. Maudry Diego- obgyn Dr. Venetia Maxon- eye Dr. Zadie Rhine- eye  Dr, Leonie Green,- [place] Dr. Candace Gallus- [place]  Advanced directives:no; info given    Depression screen:  See questionnaire below.  Depression screen [facility] 2/9 10/20/2019 06/17/2018 06/08/2018 06/04/2017 01/31/2015  Decreased Interest 0 0 0 0 0  Down, Depressed, Hopeless 0  0 0 0 0  PHQ - 2 Score 0 0 0 0 0    Fall Risk Screen: see questionnaire below. Fall Risk  10/20/2019 06/17/2018 06/08/2018 06/04/2017 01/31/2015  Falls in the past year? 0 No No No No  Number falls in past yr: 0 - - - -  Injury with Fall? 0 - - - -    ADL screen:  See questionnaire below Functional Status Survey: Is the patient deaf or have difficulty hearing?: No Does the patient have difficulty seeing, even when wearing glasses/contacts?: No Does the patient have difficulty concentrating, remembering, or making decisions?: No Does the patient have difficulty walking or climbing stairs?: No Does the patient have difficulty dressing or bathing?: No Does the patient have difficulty doing errands alone such as visiting a doctor's office or shopping?: No   Review of Systems Constitutional: -, -unexpected weight change, -anorexia, -fatigue Allergy: -sneezing, -itching, -congestion Dermatology: denies changing moles, rash, lumps ENT: -runny nose, -ear pain, -sore throat,  Cardiology:  -chest pain, -palpitations, -orthopnea, Respiratory: -cough, -shortness of breath, -dyspnea on exertion, -wheezing,  Gastroenterology: -abdominal pain, -nausea, -vomiting, -diarrhea, -constipation, -dysphagia Hematology: -bleeding or bruising problems Musculoskeletal: -arthralgias, -myalgias, -joint swelling, -back pain, - Ophthalmology: -vision changes,  Urology: -dysuria, -difficulty urinating,  -urinary frequency, -urgency, incontinence Neurology: -, -numbness, , -memory loss, -falls, -dizziness    PHYSICAL EXAM:  Ht 4' 10.5" (1.486 m)   Wt 204 lb 9.6 oz (92.8 kg)   BMI 42.03 kg/m   General Appearance: Alert, cooperative, no distress, appears stated age Head: Normocephalic, without obvious abnormality, atraumatic Eyes: PERRL, conjunctiva/corneas clear, EOM's intact, Ears: Normal  TM's and external ear canals Nose: Nares normal, mucosa normal, no drainage or sinus tenderness Throat: Lips, mucosa,  and tongue normal; teeth and gums normal Neck: Supple, no lymphadenopathy;  thyroid:  no enlargement/tenderness/nodules; no carotid bruit or JVD Lungs: Clear to auscultation bilaterally without wheezes, rales or ronchi; respirations unlabored Heart: Regular rate and rhythm, S1 and S2 normal, no murmur, rubor gallop Abdomen: Soft, non-tender, nondistended, normoactive bowel sounds,  no masses, no hepatosplenomegaly  Pulses: 2+ and symmetric all extremities Skin:  Skin color, texture, turgor normal, no rashes or lesions Lymph nodes: Cervical, supraclavicular, and axillary nodes normal Neurologic:  CNII-XII intact, normal strength, sensation and gait; reflexes 2+ and symmetric throughout Psych: Normal mood, affect, hygiene and grooming.  ASSESSMENT/PLAN: Routine general medical examination at a health care facility - Plan: CBC with Differential, Comprehensive metabolic panel, Lipid panel  Hyperlipidemia LDL goal <100 - Plan: Lipid panel, pravastatin (PRAVACHOL) 80 MG tablet  History of glaucoma  Essential hypertension - Plan: CBC with Differential, Comprehensive metabolic panel, losartan (COZAAR) 25 MG tablet  Allergic rhinitis, mild - Plan: montelukast (SINGULAIR) 10 MG tablet  Alopecia  History of breast cancer in female, s/p L MRM with reconstruction 1989 w [place]  Morbid obesity ([place]) - Plan: CBC with Differential, Comprehensive metabolic panel, Lipid panel  OSA (obstructive sleep apnea)  Osteopenia of multiple sites - Plan: CBC with Differential, Comprehensive metabolic panel     Recommend at least 30 minutes of aerobic activity at least 5 days/week and weight-bearing exercise 2x/week;  healthy diet, including goals of calcium and vitamin D intake  Immunization recommendations discussed.  Colonoscopy recommendations reviewed   Medicare Attestation I have personally reviewed: The patient's medical and social history Their use of alcohol, tobacco or illicit  drugs Their current medications and supplements The patient's functional ability including ADLs,fall risks, home safety risks, cognitive, and hearing and visual impairment Diet and physical activities Evidence for depression or mood disorders  The patient's weight, height, and BMI have been recorded in the chart.  I have made referrals, counseling, and provided education to the patient based on review of the above and I have provided the patient with a written personalized care plan for preventive services.     Jill Alexanders, MD   10/20/2019

## 2019-10-21 LAB — CBC WITH DIFFERENTIAL/PLATELET
Basophils Absolute: 0.1 10*3/uL (ref 0.0–0.2)
Basos: 1 %
EOS (ABSOLUTE): 0.1 10*3/uL (ref 0.0–0.4)
Eos: 1 %
Hematocrit: 40.6 % (ref 34.0–46.6)
Hemoglobin: 13.4 g/dL (ref 11.1–15.9)
Immature Grans (Abs): 0 10*3/uL (ref 0.0–0.1)
Immature Granulocytes: 0 %
Lymphocytes Absolute: 3 10*3/uL (ref 0.7–3.1)
Lymphs: 42 %
MCH: 31.8 pg (ref 26.6–33.0)
MCHC: 33 g/dL (ref 31.5–35.7)
MCV: 96 fL (ref 79–97)
Monocytes Absolute: 0.6 10*3/uL (ref 0.1–0.9)
Monocytes: 9 %
Neutrophils Absolute: 3.5 10*3/uL (ref 1.4–7.0)
Neutrophils: 47 %
Platelets: 218 10*3/uL (ref 150–450)
RBC: 4.22 x10E6/uL (ref 3.77–5.28)
RDW: 13.9 % (ref 11.7–15.4)
WBC: 7.3 10*3/uL (ref 3.4–10.8)

## 2019-10-21 LAB — LIPID PANEL
Chol/HDL Ratio: 3.8 ratio (ref 0.0–4.4)
Cholesterol, Total: 188 mg/dL (ref 100–199)
HDL: 49 mg/dL (ref >39)
LDL Chol Calc (NIH): 118 mg/dL — ABNORMAL HIGH (ref 0–99)
Triglycerides: 116 mg/dL (ref 0–149)
VLDL Cholesterol Cal: 21 mg/dL (ref 5–40)

## 2019-10-21 LAB — COMPREHENSIVE METABOLIC PANEL
ALT: 10 IU/L (ref 0–32)
AST: 19 IU/L (ref 0–40)
Albumin/Globulin Ratio: 1.1 — ABNORMAL LOW (ref 1.2–2.2)
Albumin: 4.2 g/dL (ref 3.7–4.7)
Alkaline Phosphatase: 66 IU/L (ref 39–117)
BUN/Creatinine Ratio: 20 (ref 12–28)
BUN: 19 mg/dL (ref 8–27)
Bilirubin Total: 0.7 mg/dL (ref 0.0–1.2)
CO2: 20 mmol/L (ref 20–29)
Calcium: 10.1 mg/dL (ref 8.7–10.3)
Chloride: 107 mmol/L — ABNORMAL HIGH (ref 96–106)
Creatinine, Ser: 0.97 mg/dL (ref 0.57–1.00)
GFR calc Af Amer: 64 mL/min/{1.73_m2} (ref >59)
GFR calc non Af Amer: 55 mL/min/{1.73_m2} — ABNORMAL LOW (ref >59)
Globulin, Total: 3.8 g/dL (ref 1.5–4.5)
Glucose: 109 mg/dL — ABNORMAL HIGH (ref 65–99)
Potassium: 4.4 mmol/L (ref 3.5–5.2)
Sodium: 141 mmol/L (ref 134–144)
Total Protein: 8 g/dL (ref 6.0–8.5)

## 2019-10-25 LAB — HGB A1C W/O EAG: Hgb A1c MFr Bld: 5.7 % — ABNORMAL HIGH (ref 4.8–5.6)

## 2019-10-25 LAB — SPECIMEN STATUS REPORT

## 2019-11-11 ENCOUNTER — Ambulatory Visit: Payer: Medicare Other | Attending: Internal Medicine

## 2019-11-11 DIAGNOSIS — Z23 Encounter for immunization: Secondary | ICD-10-CM | POA: Insufficient documentation

## 2019-11-11 NOTE — Progress Notes (Signed)
   Covid-19 Vaccination Clinic  Name:  Sarah Howell    MRN: [id number] DOB: 11/13/1940  11/11/2019  [name] was observed post Covid-19 immunization for 15 minutes without incidence. She was provided with Vaccine Information Sheet and instruction to access the V-Safe system.   [name] was instructed to call 911 with any severe reactions post vaccine: Marland Kitchen Difficulty breathing  . Swelling of your face and throat  . A fast heartbeat  . A bad rash all over your body  . Dizziness and weakness    Immunizations Administered    Name Date Dose VIS Date Route   Pfizer COVID-19 Vaccine 11/11/2019  8:42 AM 0.3 mL 10/07/2019 Intramuscular   Manufacturer: [place]   Lot: [id number]   [place]: [id number]

## 2019-11-30 ENCOUNTER — Ambulatory Visit: Payer: Medicare PPO | Attending: Internal Medicine

## 2019-11-30 DIAGNOSIS — Z23 Encounter for immunization: Secondary | ICD-10-CM | POA: Insufficient documentation

## 2019-11-30 NOTE — Progress Notes (Signed)
   Covid-19 Vaccination Clinic  Name:  Sarah Howell    MRN: [id number] DOB: 1941/09/14  11/30/2019  [name] was observed post Covid-19 immunization for 15 minutes without incidence. She was provided with Vaccine Information Sheet and instruction to access the V-Safe system.   [name] was instructed to call 911 with any severe reactions post vaccine: Marland Kitchen Difficulty breathing  . Swelling of your face and throat  . A fast heartbeat  . A bad rash all over your body  . Dizziness and weakness    Immunizations Administered    Name Date Dose VIS Date Route   Pfizer COVID-19 Vaccine 11/30/2019  8:18 AM 0.3 mL 10/07/2019 Intramuscular   Manufacturer: [place]   Lot: [id number]   [place]: [id number]

## 2020-01-17 DIAGNOSIS — Z01419 Encounter for gynecological examination (general) (routine) without abnormal findings: Secondary | ICD-10-CM | POA: Diagnosis not present

## 2020-02-13 ENCOUNTER — Other Ambulatory Visit: Payer: Self-pay | Admitting: Family Medicine

## 2020-02-13 DIAGNOSIS — Z23 Encounter for immunization: Secondary | ICD-10-CM

## 2020-03-28 DIAGNOSIS — H401132 Primary open-angle glaucoma, bilateral, moderate stage: Secondary | ICD-10-CM | POA: Diagnosis not present

## 2020-04-03 ENCOUNTER — Other Ambulatory Visit: Payer: Self-pay | Admitting: Family Medicine

## 2020-04-03 DIAGNOSIS — Z1231 Encounter for screening mammogram for malignant neoplasm of breast: Secondary | ICD-10-CM

## 2020-04-10 ENCOUNTER — Other Ambulatory Visit: Payer: Self-pay

## 2020-04-10 ENCOUNTER — Encounter (INDEPENDENT_AMBULATORY_CARE_PROVIDER_SITE_OTHER): Payer: Self-pay | Admitting: Ophthalmology

## 2020-04-10 ENCOUNTER — Ambulatory Visit (INDEPENDENT_AMBULATORY_CARE_PROVIDER_SITE_OTHER): Payer: Medicare PPO | Admitting: Ophthalmology

## 2020-04-10 DIAGNOSIS — H35072 Retinal telangiectasis, left eye: Secondary | ICD-10-CM

## 2020-04-10 DIAGNOSIS — H35071 Retinal telangiectasis, right eye: Secondary | ICD-10-CM

## 2020-04-10 NOTE — Progress Notes (Signed)
04/10/2020     CHIEF COMPLAINT Patient presents for Retina Follow Up   HISTORY OF PRESENT ILLNESS: Sarah Howell is a 79 y.o. female who presents to the clinic today for:   HPI    Retina Follow Up    Patient presents with  Other.  In both eyes.  Duration of 9 months.  Since onset it is stable.          Comments    9 month follow up - OCT OU Patient denies change in vision and overall has no complaints. *Patients son passed away in 12-22-22  History of mac-tel OU,, IMPROVED IMMEDIATELY POST CPAP USE.       Last edited by Hurman Horn, MD on 04/10/2020  9:49 AM. (History)      Referring physician: Denita Lung, MD [place],  [place] [address]  HISTORICAL INFORMATION:   Selected notes from the MEDICAL RECORD NUMBER    Lab Results  Component Value Date   HGBA1C 5.7 (H) 10/20/2019     CURRENT MEDICATIONS: Current Outpatient Medications (Ophthalmic Drugs)  Medication Sig  . dorzolamide-timolol (COSOPT) 22.3-6.8 MG/ML ophthalmic solution Place 1 drop into both eyes 2 (two) times daily.   No current facility-administered medications for this visit. (Ophthalmic Drugs)   Current Outpatient Medications (Other)  Medication Sig  . Calcium Carbonate-Vitamin D (CALCIUM + D PO) Take 1 tablet by mouth 2 (two) times daily.   . finasteride (PROSCAR) 5 MG tablet Take 0.5 tablets (2.5 mg total) by mouth daily. Patient uses for hair growth.  . fluticasone (FLONASE) 50 MCG/ACT nasal spray PLACE 2 SPRAYS INTO THE NOSE DAILY.  Marland Kitchen losartan (COZAAR) 25 MG tablet Take 1 tablet (25 mg total) by mouth daily.  . montelukast (SINGULAIR) 10 MG tablet TAKE 1 TABLET BY MOUTH EVERYDAY AT BEDTIME  . pravastatin (PRAVACHOL) 80 MG tablet Take 1 tablet (80 mg total) by mouth daily.   No current facility-administered medications for this visit. (Other)      REVIEW OF SYSTEMS:    ALLERGIES Allergies  Allergen Reactions  . Fish Allergy Nausea And Vomiting  . Other Nausea  And Vomiting    PAST MEDICAL HISTORY Past Medical History:  Diagnosis Date  . Allergy   . Alopecia   . Breast cancer ([place]) 1989   left breast cancer; s/p mastectomy and reconstruction  . Cancer ([facility])    BREAST  . Cough   . Glaucoma    bilateral  . Hyperlipidemia   . Hypertension    dr Demetrios Isaacs    pcp  . Osteopenia   . Shortness of breath    WITH EXERTION    Past Surgical History:  Procedure Laterality Date  . BREAST SURGERY     lft br mastectomy and reconstruction  . CATARACT EXTRACTION W/PHACO  12/10/2011   Procedure: CATARACT EXTRACTION PHACO AND INTRAOCULAR LENS PLACEMENT (IOC);  Surgeon: Marylynn Pearson, MD;  Location: [place];  Service: Ophthalmology;  Laterality: Right;  . CATARACT EXTRACTION W/PHACO  02/04/2012   Procedure: CATARACT EXTRACTION PHACO AND INTRAOCULAR LENS PLACEMENT (IOC);  Surgeon: Marylynn Pearson, MD;  Location: [facility];  Service: Ophthalmology;  Laterality: Left;  . EYE SURGERY Bilateral 12/23/11   cataract surgery  . lt breast ca reconstruction  07/28/1988  . MASTECTOMY Left 1989  . Orthoscopic surgery lt knee  2000/12/22  . Orthoscopic surgery rt knee  08/05/2009  . Rotoator cuff cleaning      FAMILY HISTORY Family History  Problem  Relation Age of Onset  . Colon cancer Mother        dx. 55s; "recurrence" at 33  . Prostate cancer Father        dx. 49s  . Pancreatic cancer Brother        dx. 81-82  . Breast cancer Sister 37  . Pancreatic cancer Sister   . Parkinson's disease Brother   . Prostate cancer Son 59  . Cancer Other        unspecified type  . Diabetes Paternal Aunt   . Diabetes Paternal Uncle   . Breast cancer Cousin        dx. <70s  . Breast cancer Cousin        dx. >50    SOCIAL HISTORY Social History   Tobacco Use  . Smoking status: Never Smoker  . Smokeless tobacco: Never Used  Substance Use Topics  . Alcohol use: No    Alcohol/week: 0.0 standard drinks  . Drug use: No         OPHTHALMIC EXAM:  Base Eye Exam    Visual Acuity  (Snellen - Linear)      Right Left   Dist [place] 20/30-2 20/50-1   Dist ph   20/30-1       Tonometry (Tonopen, 8:55 AM)      Right Left   Pressure 13 12       Pupils      Pupils Dark Light Shape React APD   Right PERRL 4 3 Round Slow None   Left PERRL 4 3 Round Slow None       Visual Fields (Counting fingers)      Left Right    Full Full       Extraocular Movement      Right Left    Full Full       Neuro/Psych    Oriented x3: Yes   Mood/Affect: Normal       Dilation    Both eyes: 1.0% Mydriacyl, 2.5% Phenylephrine @ 8:56 AM        Slit Lamp and Fundus Exam    External Exam      Right Left   External Normal Normal       Slit Lamp Exam      Right Left   Lids/Lashes Normal Normal   Conjunctiva/Sclera Raczynski and quiet Mossa and quiet   Cornea Clear Clear   Anterior Chamber Deep and quiet Deep and quiet   Iris Round and reactive Round and reactive   Lens Posterior chamber intraocular lens Posterior chamber intraocular lens   Anterior Vitreous Normal Normal       Fundus Exam      Right Left   Posterior Vitreous Normal Normal   Disc Normal Normal   C/D Ratio 0.65 0.75   Macula Drusen, Hard drusen Drusen, Hard drusen   Vessels Normal Normal   Periphery Normal Normal          IMAGING AND PROCEDURES  Imaging and Procedures for 04/10/20  OCT, Retina - OU - Both Eyes       Right Eye Quality was good. Scan locations included subfoveal. Central Foveal Thickness: 242.   Left Eye Quality was good. Scan locations included subfoveal. Central Foveal Thickness: 274. Findings include cystoid macular edema.   Notes OD with minor inner retinal perifoveal CME..   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  Findings in or near the macula (center of vision) consist of microaneurysms (leaking small capillaries), often with leakage of fluid which in the active phase can impact fine discriminatory vision, and in some cases trigger profound scarring in the macula, with severe permanent vision loss.  Standard treatment is observation and periodic examinations to monitor for treatable complications.   The cause  of this condition is "unknown".  However, the practice of Dr. Zadie Rhine has discovered an association with sleep apnea with its nightly periods of low oxygen in the blood stream (hypoxia), retained carbon dioxide (hypercapnia), associated with transient nocturnal hypertensive episodes.   More recently, some patients also been found to have advanced lung disease, whether asthma or COPD, with similar findings.  Dr. Zadie Rhine has been evaluating the association of sleep apnea, nightly hypoxia, and Macular telangiectasis for over 18 years.  Most patients are found to be noncompliant with sleep apnea therapy or testing in the past.  Resumption of CPAP or similar therapy is strongly recommended if ordered in the past.  Upon review of risk factors or findings positive for sleep apnea, more formal, extensive sleep laboratory or home testing, may be recommended.  Numerous patients, proven to have MAC-TEL, have improved or resolved their ey condition promptly, within weeks, of the use of nighttime oxygen supplementation or continuous positive airway pressure (CPAP).      ICD-10-CM   1. 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 Type 2 macular telangiectasis, right  H35.071 OCT, Retina - OU - Both Eyes    1.  We will schedule [place] fundus fluorescein angiography OU, will transit OS release next visit  2.  3.  Ophthalmic Meds Ordered this visit:  No orders of the defined types were placed in this encounter.      Return in about 2 weeks  (around 04/24/2020) for HBERG FFA, OS, // OD, DILATE OU.  There are no Patient Instructions on file for this visit.   Explained the diagnoses, plan, and follow up with the patient and they expressed understanding.  Patient expressed understanding of the importance of proper follow up care.   Clent Demark Trinette Vera M.D. Diseases & Surgery of the Retina and Vitreous [facility]ina [facility] Di[facility] [place] 04/10/20     Abbreviations: M myopia (nearsighted); A astigmatism; H hyperopia (farsighted); P presbyopia; Mrx spectacle prescription;  CTL contact lenses; OD right eye; OS left eye; OU both eyes  XT exotropia; ET esotropia; PEK punctate epithelial keratitis; PEE punctate epithelial erosions; DES dry eye syndrome; MGD meibomian gland dysfunction; ATs artificial tears; PFAT's preservative free artificial tears; [place] nuclear sclerotic cataract; PSC posterior subcapsular cataract; ERM epi-retinal membrane; PVD posterior vitreous detachment; RD retinal detachment; DM diabetes mellitus; DR diabetic retinopathy; NPDR non-proliferative diabetic retinopathy; PDR proliferative diabetic retinopathy; CSME clinically significant macular edema; DME diabetic macular edema; dbh dot blot hemorrhages; CWS cotton wool spot; POAG primary open angle glaucoma; C/D cup-to-disc ratio; HVF humphrey visual field; GVF goldmann visual field; OCT optical coherence tomography; IOP intraocular pressure; BRVO Branch retinal vein occlusion; CRVO central retinal vein occlusion; CRAO central retinal artery occlusion; BRAO branch retinal artery occlusion; RT retinal tear; SB scleral buckle; PPV pars plana vitrectomy; VH Vitreous  hemorrhage; PRP panretinal laser photocoagulation; IVK intravitreal kenalog; VMT vitreomacular traction; MH Macular hole;  NVD neovascularization of the disc; NVE neovascularization elsewhere; AREDS age related eye disease study; ARMD age related macular degeneration; POAG primary open angle glaucoma; EBMD epithelial/anterior  basement membrane dystrophy; ACIOL anterior chamber intraocular lens; IOL intraocular lens; PCIOL posterior chamber intraocular lens; Phaco/IOL phacoemulsification with intraocular lens placement; Brule photorefractive keratectomy; LASIK laser assisted in situ keratomileusis; HTN hypertension; DM diabetes mellitus; COPD chronic obstructive pulmonary disease

## 2020-04-10 NOTE — Assessment & Plan Note (Signed)
Macular telangiectasis (MAC-TEL), or parafoveal telangiectasis is a condition of "unknown" cause.  Findings in or near the macula (center of vision) consist of microaneurysms (leaking small capillaries), often with leakage of fluid which in the active phase can impact fine discriminatory vision, and in some cases trigger profound scarring in the macula, with severe permanent vision loss.  Standard treatment is observation and periodic examinations to monitor for treatable complications.   The cause  of this condition is "unknown".  However, the practice of Dr. Eris Hannan has discovered an association with sleep apnea with its nightly periods of low oxygen in the blood stream (hypoxia), retained carbon dioxide (hypercapnia), associated with transient nocturnal hypertensive episodes.   More recently, some patients also been found to have advanced lung disease, whether asthma or COPD, with similar findings.  Dr. Carlitos Bottino has been evaluating the association of sleep apnea, nightly hypoxia, and Macular telangiectasis for over 18 years.  Most patients are found to be noncompliant with sleep apnea therapy or testing in the past.  Resumption of CPAP or similar therapy is strongly recommended if ordered in the past.  Upon review of risk factors or findings positive for sleep apnea, more formal, extensive sleep laboratory or home testing, may be recommended.  Numerous patients, proven to have MAC-TEL, have improved or resolved their ey condition promptly, within weeks, of the use of nighttime oxygen supplementation or continuous positive airway pressure (CPAP). 

## 2020-04-25 ENCOUNTER — Other Ambulatory Visit: Payer: Self-pay

## 2020-04-25 ENCOUNTER — Ambulatory Visit (INDEPENDENT_AMBULATORY_CARE_PROVIDER_SITE_OTHER): Payer: Medicare PPO | Admitting: Ophthalmology

## 2020-04-25 ENCOUNTER — Encounter (INDEPENDENT_AMBULATORY_CARE_PROVIDER_SITE_OTHER): Payer: Self-pay | Admitting: Ophthalmology

## 2020-04-25 DIAGNOSIS — H35073 Retinal telangiectasis, bilateral: Secondary | ICD-10-CM

## 2020-04-25 DIAGNOSIS — H35072 Retinal telangiectasis, left eye: Secondary | ICD-10-CM

## 2020-04-25 DIAGNOSIS — H35071 Retinal telangiectasis, right eye: Secondary | ICD-10-CM

## 2020-04-25 NOTE — Assessment & Plan Note (Signed)
Macular telangiectasis (MAC-TEL), or parafoveal telangiectasis is a condition of "unknown" cause.  Findings in or near the macula (center of vision) consist of microaneurysms (leaking small capillaries), often with leakage of fluid which in the active phase can impact fine discriminatory vision, and in some cases trigger profound scarring in the macula, with severe permanent vision loss.  Standard treatment is observation and periodic examinations to monitor for treatable complications.   The cause  of this condition is "unknown".  However, the practice of Dr. Zadie Rhine has discovered an association with sleep apnea with its nightly periods of low oxygen in the blood stream (hypoxia), retained carbon dioxide (hypercapnia), associated with transient nocturnal hypertensive episodes.   More recently, some patients also been found to have advanced lung disease, whether asthma or COPD, with similar findings.  Dr. Zadie Rhine has been evaluating the association of sleep apnea, nightly hypoxia, and Macular telangiectasis for over 18 years.  Most patients are found to be noncompliant with sleep apnea therapy or testing in the past.  Resumption of CPAP or similar therapy is strongly recommended if ordered in the past.  Upon review of risk factors or findings positive for sleep apnea, more formal, extensive sleep laboratory or home testing, may be recommended.  Numerous patients, proven to have MAC-TEL, have improved or resolved their ey condition promptly, within weeks, of the use of nighttime oxygen supplementation or continuous positive airway pressure (CPAP).  Patient continues on CPAP most nights.  Incidentally the patient has forced angiographic evidence of late leakage OS yet the OCT coincident today shows no definitive OCT collection of CME.

## 2020-04-25 NOTE — Progress Notes (Signed)
04/25/2020     CHIEF COMPLAINT Patient presents for Retina Follow Up   HISTORY OF PRESENT ILLNESS: Sarah Howell is a 79 y.o. female who presents to the clinic today for:   HPI    Retina Follow Up    Patient presents with  Other.  In both eyes.  Duration of 2 weeks.  Since onset it is stable.          Comments    2 week f.u - FFA L/R Patient denies change in vision and overall has no complaints.  Patient continues on nightly CPAP use however she does report the machine breaking 2 nights ago and so today's visit is 2 nights off of Use          Last edited by Hurman Horn, MD on 04/25/2020  9:05 AM. (History)      Referring physician: Denita Lung, MD [place],  [place] [phone]  HISTORICAL INFORMATION:   Selected notes from the MEDICAL RECORD NUMBER    Lab Results  Component Value Date   HGBA1C 5.7 (H) 10/20/2019     CURRENT MEDICATIONS: Current Outpatient Medications (Ophthalmic Drugs)  Medication Sig  . dorzolamide-timolol (COSOPT) 22.3-6.8 MG/ML ophthalmic solution Place 1 drop into both eyes 2 (two) times daily.   No current facility-administered medications for this visit. (Ophthalmic Drugs)   Current Outpatient Medications (Other)  Medication Sig  . Calcium Carbonate-Vitamin D (CALCIUM + D PO) Take 1 tablet by mouth 2 (two) times daily.   . finasteride (PROSCAR) 5 MG tablet Take 0.5 tablets (2.5 mg total) by mouth daily. Patient uses for hair growth.  . fluticasone (FLONASE) 50 MCG/ACT nasal spray PLACE 2 SPRAYS INTO THE NOSE DAILY.  Marland Kitchen losartan (COZAAR) 25 MG tablet Take 1 tablet (25 mg total) by mouth daily.  . montelukast (SINGULAIR) 10 MG tablet TAKE 1 TABLET BY MOUTH EVERYDAY AT BEDTIME  . pravastatin (PRAVACHOL) 80 MG tablet Take 1 tablet (80 mg total) by mouth daily.   No current facility-administered medications for this visit. (Other)      REVIEW OF SYSTEMS:    ALLERGIES Allergies  Allergen Reactions  . Fish  Allergy Nausea And Vomiting  . Other Nausea And Vomiting    PAST MEDICAL HISTORY Past Medical History:  Diagnosis Date  . Allergy   . Alopecia   . Breast cancer ([facility]) 1989   left breast cancer; s/p mastectomy and reconstruction  . Cancer (HCC)    BREAST  . Cough   . Glaucoma    bilateral  . Hyperlipidemia   . Hypertension    dr Demetrios Isaacs    pcp  . Osteopenia   . Shortness of breath    WITH EXERTION    Past Surgical History:  Procedure Laterality Date  . BREAST SURGERY     lft br mastectomy and reconstruction  . CATARACT EXTRACTION W/PHACO  12/10/2011   Procedure: CATARACT EXTRACTION PHACO AND INTRAOCULAR LENS PLACEMENT (IOC);  Surgeon: Marylynn Pearson, MD;  Location: [facility];  Service: Ophthalmology;  Laterality: Right;  . CATARACT EXTRACTION W/PHACO  02/04/2012   Procedure: CATARACT EXTRACTION PHACO AND INTRAOCULAR LENS PLACEMENT (IOC);  Surgeon: Marylynn Pearson, MD;  Location: [facility];  Service: Ophthalmology;  Laterality: Left;  . EYE SURGERY Bilateral 2013   cataract surgery  . lt breast ca reconstruction  07/28/1988  . MASTECTOMY Left 1989  . Orthoscopic surgery lt knee  2002  . Orthoscopic surgery rt knee  08/05/2009  . Rotoator  cuff cleaning      FAMILY HISTORY Family History  Problem Relation Age of Onset  . Colon cancer Mother        dx. 48s; "recurrence" at 19  . Prostate cancer Father        dx. 13s  . Pancreatic cancer Brother        dx. 81-82  . Breast cancer Sister 44  . Pancreatic cancer Sister   . Parkinson's disease Brother   . Prostate cancer Son 81  . Cancer Other        unspecified type  . Diabetes Paternal Aunt   . Diabetes Paternal Uncle   . Breast cancer Cousin        dx. <70s  . Breast cancer Cousin        dx. >50    SOCIAL HISTORY Social History   Tobacco Use  . Smoking status: Never Smoker  . Smokeless tobacco: Never Used  Substance Use Topics  . Alcohol use: No    Alcohol/week: 0.0 standard drinks  . Drug use: No          OPHTHALMIC EXAM:  Base Eye Exam    Visual Acuity (Snellen - Linear)      Right Left   Dist [place] 20/50 20/50-2   Dist ph [place] 20/30-2 20/30-2       Tonometry (Tonopen, 8:24 AM)      Right Left   Pressure 12 8       Pupils      Pupils Dark Light Shape React APD   Right PERRL 4 3 Round Slow None   Left PERRL 4 3 Round Slow None       Visual Fields (Counting fingers)      Left Right    Full Full       Extraocular Movement      Right Left    Full Full       Neuro/Psych    Oriented x3: Yes   Mood/Affect: Normal       Dilation    Both eyes: 1.0% Mydriacyl, 2.5% Phenylephrine @ 8:24 AM        Slit Lamp and Fundus Exam    External Exam      Right Left   External Normal Normal       Slit Lamp Exam      Right Left   Lids/Lashes Normal Normal   Conjunctiva/Sclera [place] and quiet [place] and quiet   Cornea Clear Clear   Anterior Chamber Deep and quiet Deep and quiet   Iris Round and reactive Round and reactive   Lens Posterior chamber intraocular lens Posterior chamber intraocular lens   Anterior Vitreous Normal Normal       Fundus Exam      Right Left   Posterior Vitreous Normal Normal   Disc Normal Normal   C/D Ratio 0.65 0.75   Macula Drusen, Hard drusen Drusen, Hard drusen   Vessels Normal Normal   Periphery Normal Normal          IMAGING AND PROCEDURES  Imaging and Procedures for 04/25/20  Fluorescein Angiography [place] (Transit OS)       Right Eye   Progression has been stable. Early phase findings include microaneurysm. Mid/Late phase findings include microaneurysm.   Left Eye   Progression has worsened. Early phase findings include leakage, microaneurysm. Mid/Late phase findings include microaneurysm, leakage.   Notes OD with no mid or late phase leaking spots, no active CME  OS, with  mid to late phase leakages from perifoveal Microaneurysms particularly in the nasal aspect of the fovea.  Today, CME worse than 2 years ago,  coincidentally 2 days with a broken CPAP machine will repeat OCT today to correlate and compare OCT findings to 2 weeks ago       Color Fundus Photography Optos - OU - Both Eyes       Right Eye Progression has been stable. Disc findings include increased cup to disc ratio.   Left Eye Progression has been stable. Disc findings include increased cup to disc ratio.   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  Findings in or near the macula (center of vision) consist of microaneurysms (leaking small capillaries), often with leakage of fluid which in the active phase can impact fine discriminatory vision, and in some cases trigger profound scarring in the macula, with severe permanent vision loss.  Standard treatment is observation and periodic examinations to monitor for treatable complications.   The cause  of this condition is "unknown".  However, the practice of Dr. Zadie Rhine has discovered an association with sleep apnea with its nightly periods of low oxygen in the blood stream (hypoxia), retained carbon dioxide (hypercapnia), associated with transient nocturnal hypertensive episodes.   More recently, some patients also been found to have advanced lung disease, whether asthma or COPD, with similar findings.  Dr. Zadie Rhine has been evaluating the association of sleep apnea, nightly hypoxia, and Macular telangiectasis for over 18 years.  Most patients are found to be noncompliant with sleep apnea therapy or testing in the past.  Resumption of CPAP or similar therapy is strongly recommended if ordered in the past.  Upon review of risk factors or findings positive for sleep apnea, more formal, extensive sleep laboratory or home  testing, may be recommended.  Numerous patients, proven to have MAC-TEL, have improved or resolved their ey condition promptly, within weeks, of the use of nighttime oxygen supplementation or continuous positive airway pressure (CPAP).  Patient continues on CPAP most nights.  Incidentally the patient has forced angiographic evidence of late leakage OS yet the OCT coincident today shows no definitive OCT collection of CME.      ICD-10-CM   1. 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 Type 2 macular telangiectasis, right  H35.071 Fluorescein Angiography [place] (Transit OS)    Color Fundus Photography Optos - OU - Both Eyes    1.  2.  3.  Ophthalmic Meds Ordered this visit:  No orders of the defined types were placed in this encounter.      Return in about 6 months (around 10/25/2020) for DILATE OU, OCT.  There are no Patient Instructions on file for this visit.   Explained the diagnoses, plan, and follow up with the patient and they expressed understanding.  Patient expressed understanding of the importance of proper follow up care.   Clent Demark Octa Uplinger M.D. Diseases & Surgery of the Retina and Vitreous Retina & Diabetic [place] 04/25/20     Abbreviations: M myopia (nearsighted); A astigmatism; H hyperopia (farsighted); P presbyopia; Mrx spectacle prescription;  CTL contact lenses; OD right eye; OS left eye; OU both eyes  XT exotropia; ET esotropia; PEK punctate epithelial keratitis; PEE punctate epithelial erosions; DES dry eye syndrome; MGD meibomian gland dysfunction; ATs artificial tears; PFAT's preservative free artificial tears; [place] nuclear sclerotic cataract; PSC posterior subcapsular cataract; ERM epi-retinal membrane; PVD posterior vitreous detachment; RD retinal detachment; DM diabetes mellitus; DR diabetic retinopathy; NPDR non-proliferative diabetic  retinopathy; PDR proliferative diabetic retinopathy; CSME clinically significant macular edema; DME diabetic macular edema; dbh dot blot hemorrhages; CWS cotton wool spot; POAG primary open angle glaucoma; C/D cup-to-disc ratio; HVF humphrey visual field; GVF goldmann visual field; OCT optical coherence tomography; IOP intraocular pressure; BRVO Branch retinal vein occlusion; CRVO central retinal vein occlusion; CRAO central retinal artery occlusion; BRAO branch retinal artery occlusion; RT retinal tear; SB scleral buckle; PPV pars plana vitrectomy; VH Vitreous hemorrhage; PRP panretinal laser photocoagulation; IVK intravitreal kenalog; VMT vitreomacular traction; MH Macular hole;  NVD neovascularization of the disc; NVE neovascularization elsewhere; AREDS age related eye disease study; ARMD age related macular degeneration; POAG primary open angle glaucoma; EBMD epithelial/anterior basement membrane dystrophy; ACIOL anterior chamber intraocular lens; IOL intraocular lens; PCIOL posterior chamber intraocular lens; Phaco/IOL phacoemulsification with intraocular lens placement; [place] photorefractive keratectomy; LASIK laser assisted in situ keratomileusis; HTN hypertension; DM diabetes mellitus; COPD chronic obstructive pulmonary disease

## 2020-04-27 ENCOUNTER — Ambulatory Visit
Admission: RE | Admit: 2020-04-27 | Discharge: 2020-04-27 | Disposition: A | Discharge status: Home / Self Care | Payer: Medicare PPO | Source: Ambulatory Visit | Attending: Family Medicine | Admitting: Family Medicine

## 2020-04-27 ENCOUNTER — Other Ambulatory Visit: Payer: Self-pay

## 2020-04-27 DIAGNOSIS — Z1231 Encounter for screening mammogram for malignant neoplasm of breast: Secondary | ICD-10-CM | POA: Diagnosis not present

## 2020-07-03 ENCOUNTER — Other Ambulatory Visit (INDEPENDENT_AMBULATORY_CARE_PROVIDER_SITE_OTHER): Payer: Medicare PPO

## 2020-07-03 DIAGNOSIS — Z23 Encounter for immunization: Secondary | ICD-10-CM

## 2020-08-15 DIAGNOSIS — H401132 Primary open-angle glaucoma, bilateral, moderate stage: Secondary | ICD-10-CM | POA: Diagnosis not present

## 2020-09-26 DIAGNOSIS — L219 Seborrheic dermatitis, unspecified: Secondary | ICD-10-CM | POA: Diagnosis not present

## 2020-09-26 DIAGNOSIS — Z79899 Other long term (current) drug therapy: Secondary | ICD-10-CM | POA: Diagnosis not present

## 2020-09-26 DIAGNOSIS — L659 Nonscarring hair loss, unspecified: Secondary | ICD-10-CM | POA: Diagnosis not present

## 2020-09-26 DIAGNOSIS — L649 Androgenic alopecia, unspecified: Secondary | ICD-10-CM | POA: Diagnosis not present

## 2020-10-25 ENCOUNTER — Encounter (INDEPENDENT_AMBULATORY_CARE_PROVIDER_SITE_OTHER): Payer: Self-pay | Admitting: Ophthalmology

## 2020-10-25 ENCOUNTER — Other Ambulatory Visit: Payer: Self-pay

## 2020-10-25 ENCOUNTER — Ambulatory Visit (INDEPENDENT_AMBULATORY_CARE_PROVIDER_SITE_OTHER): Payer: Medicare PPO | Admitting: Ophthalmology

## 2020-10-25 DIAGNOSIS — H35353 Cystoid macular degeneration, bilateral: Secondary | ICD-10-CM

## 2020-10-25 DIAGNOSIS — H35073 Retinal telangiectasis, bilateral: Secondary | ICD-10-CM | POA: Diagnosis not present

## 2020-10-25 DIAGNOSIS — H35072 Retinal telangiectasis, left eye: Secondary | ICD-10-CM | POA: Diagnosis not present

## 2020-10-25 DIAGNOSIS — H35071 Retinal telangiectasis, right eye: Secondary | ICD-10-CM | POA: Diagnosis not present

## 2020-10-25 HISTORY — DX: Cystoid macular degeneration, bilateral: H35.353

## 2020-10-25 NOTE — Assessment & Plan Note (Signed)
Complete resolution of intraretinal fluid, with nightly CPAP compliant use

## 2020-10-25 NOTE — Progress Notes (Signed)
10/25/2020     CHIEF COMPLAINT Patient presents for Retina Follow Up (6 Month F/U OU//Pt reports fluctuating VA OU. Pt sts, "it goes and comes." Pt c/o dryness OU. Pt reports intermittent pain OS.)   HISTORY OF PRESENT ILLNESS: Sarah Howell is a 79 y.o. female who presents to the clinic today for:   HPI    Retina Follow Up    Patient presents with  Other.  In left eye.  This started 6 months ago.  Severity is mild.  Duration of 6 months.  Since onset it is stable. Additional comments: 6 Month F/U OU  Pt reports fluctuating VA OU. Pt sts, "it goes and comes." Pt c/o dryness OU. Pt reports intermittent pain OS.       Last edited by Rockie Neighbours, [place] on 10/25/2020 10:04 AM. (History)      Referring physician: Denita Lung, MD [place],  [place] [phone]  HISTORICAL INFORMATION:   Selected notes from the MEDICAL RECORD NUMBER    Lab Results  Component Value Date   HGBA1C 5.7 (H) 10/20/2019     CURRENT MEDICATIONS: Current Outpatient Medications (Ophthalmic Drugs)  Medication Sig  . dorzolamide-timolol (COSOPT) 22.3-6.8 MG/ML ophthalmic solution Place 1 drop into both eyes 2 (two) times daily.   No current facility-administered medications for this visit. (Ophthalmic Drugs)   Current Outpatient Medications (Other)  Medication Sig  . Calcium Carbonate-Vitamin D (CALCIUM + D PO) Take 1 tablet by mouth 2 (two) times daily.   . finasteride (PROSCAR) 5 MG tablet Take 0.5 tablets (2.5 mg total) by mouth daily. Patient uses for hair growth.  . fluticasone (FLONASE) 50 MCG/ACT nasal spray PLACE 2 SPRAYS INTO THE NOSE DAILY.  Marland Kitchen losartan (COZAAR) 25 MG tablet Take 1 tablet (25 mg total) by mouth daily.  . montelukast (SINGULAIR) 10 MG tablet TAKE 1 TABLET BY MOUTH EVERYDAY AT BEDTIME  . pravastatin (PRAVACHOL) 80 MG tablet Take 1 tablet (80 mg total) by mouth daily.   No current facility-administered medications for this visit. (Other)      REVIEW  OF SYSTEMS:    ALLERGIES Allergies  Allergen Reactions  . Fish Allergy Nausea And Vomiting  . Other Nausea And Vomiting    PAST MEDICAL HISTORY Past Medical History:  Diagnosis Date  . Allergy   . Alopecia   . Breast cancer ([facility]) 1989   left breast cancer; s/p mastectomy and reconstruction  . Cancer (HCC)    BREAST  . Cough   . Cystoid macular edema of both eyes 10/25/2020   Resolved since 2019 commensurate with institution of nightly use of CPAP to treat active MAC-TEL  . Glaucoma    bilateral  . Hyperlipidemia   . Hypertension    dr Demetrios Isaacs    pcp  . Osteopenia   . Shortness of breath    WITH EXERTION    Past Surgical History:  Procedure Laterality Date  . BREAST SURGERY     lft br mastectomy and reconstruction  . CATARACT EXTRACTION W/PHACO  12/10/2011   Procedure: CATARACT EXTRACTION PHACO AND INTRAOCULAR LENS PLACEMENT (IOC);  Surgeon: Marylynn Pearson, MD;  Location: [facility];  Service: Ophthalmology;  Laterality: Right;  . CATARACT EXTRACTION W/PHACO  02/04/2012   Procedure: CATARACT EXTRACTION PHACO AND INTRAOCULAR LENS PLACEMENT (IOC);  Surgeon: Marylynn Pearson, MD;  Location: [facility];  Service: Ophthalmology;  Laterality: Left;  . EYE SURGERY Bilateral 2013   cataract surgery  . lt breast ca reconstruction  07/28/1988  . MASTECTOMY Left 1989  . Orthoscopic surgery lt knee  2002  . Orthoscopic surgery rt knee  08/05/2009  . Rotoator cuff cleaning      FAMILY HISTORY Family History  Problem Relation Age of Onset  . Colon cancer Mother        dx. 44s; "recurrence" at 41  . Prostate cancer Father        dx. 9s  . Pancreatic cancer Brother        dx. 81-82  . Breast cancer Sister 54  . Pancreatic cancer Sister   . Parkinson's disease Brother   . Prostate cancer Son 39  . Cancer Other        unspecified type  . Diabetes Paternal Aunt   . Diabetes Paternal Uncle   . Breast cancer Cousin        dx. <70s  . Breast cancer Cousin        dx. >50    SOCIAL  HISTORY Social History   Tobacco Use  . Smoking status: Never Smoker  . Smokeless tobacco: Never Used  Substance Use Topics  . Alcohol use: No    Alcohol/week: 0.0 standard drinks  . Drug use: No         OPHTHALMIC EXAM: Base Eye Exam    Visual Acuity (ETDRS)      Right Left   Dist [place] 20/40 20/40 +1   Dist ph  20/30 +1 20/30 +2       Tonometry (Tonopen, 10:05 AM)      Right Left   Pressure 13 15       Pupils      Pupils Dark Light Shape React APD   Right PERRL 5 4 Round Slow None   Left PERRL 5 4 Round Slow None       Visual Fields (Counting fingers)      Left Right    Full Full       Extraocular Movement      Right Left    Full Full       Neuro/Psych    Oriented x3: Yes   Mood/Affect: Normal       Dilation    Both eyes: 1.0% Mydriacyl, 2.5% Phenylephrine @ 10:09 AM        Slit Lamp and Fundus Exam    External Exam      Right Left   External Normal Normal       Slit Lamp Exam      Right Left   Lids/Lashes Normal Normal   Conjunctiva/Sclera [place] and quiet [place] and quiet   Cornea Clear Clear   Anterior Chamber Deep and quiet Deep and quiet   Iris Round and reactive Round and reactive   Lens Posterior chamber intraocular lens Posterior chamber intraocular lens   Anterior Vitreous Normal Normal       Fundus Exam      Right Left   Posterior Vitreous Normal Normal   Disc Normal Normal   C/D Ratio 0.65 0.75   Macula Drusen, Hard drusen Drusen, Hard drusen   Vessels Normal Normal   Periphery Normal Normal          IMAGING AND PROCEDURES  Imaging and Procedures for 10/25/20  OCT, Retina - OU - Both Eyes       Right Eye Quality was good. Scan locations included subfoveal. Central Foveal Thickness: 237.   Left Eye Quality was good. Scan locations included subfoveal.   Notes Much less CME,  in fact resolved CME as compared to April 2019 now that patient continues on nightly CPAP                ASSESSMENT/PLAN:  Type  2 macular telangiectasis of left eye Macular telangiectasis (MAC-TEL), or parafoveal telangiectasis is a condition of "unknown" cause.  Findings in or near the macula (center of vision) consist of microaneurysms (leaking small capillaries), often with leakage of fluid which in the active phase can impact fine discriminatory vision, and in some cases trigger profound scarring in the macula, with severe permanent vision loss.  Standard treatment is observation and periodic examinations to monitor for treatable complications.   The cause  of this condition is "unknown".  However, the practice of Dr. Zadie Rhine has discovered an association with sleep apnea with its nightly periods of low oxygen in the blood stream (hypoxia), retained carbon dioxide (hypercapnia), associated with transient nocturnal hypertensive episodes.   More recently, some patients also been found to have advanced lung disease, whether asthma or COPD, with similar findings.  Dr. Zadie Rhine has been evaluating the association of sleep apnea, nightly hypoxia, and Macular telangiectasis for over 18 years.  Most patients are found to be noncompliant with sleep apnea therapy or testing in the past.  Resumption of CPAP or similar therapy is strongly recommended if ordered in the past.  Upon review of risk factors or findings positive for sleep apnea, more formal, extensive sleep laboratory or home testing, may be recommended.  Numerous patients, proven to have MAC-TEL, have improved or resolved their ey condition promptly, within weeks, of the use of nighttime oxygen supplementation or continuous positive airway pressure (CPAP).No signs of CME activity now that patient is continued on CPAP for over 2 years  Type 2 macular telangiectasis, right Complete resolution of intraretinal fluid, with nightly CPAP compliant use      ICD-10-CM   1. 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 Type 2 macular telangiectasis,  right  H35.071 OCT, Retina - OU - Both Eyes  3. Cystoid macular edema of both eyes  H35.353     1.  Macular telangiectasis OU, inactive, inactive CME OU, on nightly CPAP with excellent compliance, improved sense of wellbeing and stable acuity  2.  3.  Ophthalmic Meds Ordered this visit:  No orders of the defined types were placed in this encounter.      Return in about 6 months (around 04/25/2021) for DILATE OU, OCT.  There are no Patient Instructions on file for this visit.   Explained the diagnoses, plan, and follow up with the patient and they expressed understanding.  Patient expressed understanding of the importance of proper follow up care.   Clent Demark Thomas Mabry M.D. Diseases & Surgery of the Retina and Vitreous Retina & Diabetic [facility] 10/25/20     Abbreviations: M myopia (nearsighted); A astigmatism; H hyperopia (farsighted); P presbyopia; Mrx spectacle prescription;  CTL contact lenses; OD right eye; OS left eye; OU both eyes  XT exotropia; ET esotropia; PEK punctate epithelial keratitis; PEE punctate epithelial erosions; DES dry eye syndrome; MGD meibomian gland dysfunction; ATs artificial tears; PFAT's preservative free artificial tears; [place] nuclear sclerotic cataract; PSC posterior subcapsular cataract; ERM epi-retinal membrane; PVD posterior vitreous detachment; RD retinal detachment; DM diabetes mellitus; DR diabetic retinopathy; NPDR non-proliferative diabetic retinopathy; PDR proliferative diabetic retinopathy; CSME clinically significant macular edema; DME diabetic macular edema; dbh dot blot hemorrhages; CWS cotton wool spot; POAG  primary open angle glaucoma; C/D cup-to-disc ratio; HVF humphrey visual field; GVF goldmann visual field; OCT optical coherence tomography; IOP intraocular pressure; BRVO Branch retinal vein occlusion; CRVO central retinal vein occlusion; CRAO central retinal artery occlusion; BRAO branch retinal artery occlusion; RT retinal tear; SB scleral  buckle; PPV pars plana vitrectomy; VH Vitreous hemorrhage; PRP panretinal laser photocoagulation; IVK intravitreal kenalog; VMT vitreomacular traction; MH Macular hole;  NVD neovascularization of the disc; NVE neovascularization elsewhere; AREDS age related eye disease study; ARMD age related macular degeneration; POAG primary open angle glaucoma; EBMD epithelial/anterior basement membrane dystrophy; ACIOL anterior chamber intraocular lens; IOL intraocular lens; PCIOL posterior chamber intraocular lens; Phaco/IOL phacoemulsification with intraocular lens placement; [place] photorefractive keratectomy; LASIK laser assisted in situ keratomileusis; HTN hypertension; DM diabetes mellitus; COPD chronic obstructive pulmonary disease

## 2020-10-25 NOTE — Assessment & Plan Note (Addendum)
Macular telangiectasis (MAC-TEL), or parafoveal telangiectasis is a condition of "unknown" cause.  Findings in or near the macula (center of vision) consist of microaneurysms (leaking small capillaries), often with leakage of fluid which in the active phase can impact fine discriminatory vision, and in some cases trigger profound scarring in the macula, with severe permanent vision loss.  Standard treatment is observation and periodic examinations to monitor for treatable complications.   The cause  of this condition is "unknown".  However, the practice of Dr. Zadie Rhine has discovered an association with sleep apnea with its nightly periods of low oxygen in the blood stream (hypoxia), retained carbon dioxide (hypercapnia), associated with transient nocturnal hypertensive episodes.   More recently, some patients also been found to have advanced lung disease, whether asthma or COPD, with similar findings.  Dr. Zadie Rhine has been evaluating the association of sleep apnea, nightly hypoxia, and Macular telangiectasis for over 18 years.  Most patients are found to be noncompliant with sleep apnea therapy or testing in the past.  Resumption of CPAP or similar therapy is strongly recommended if ordered in the past.  Upon review of risk factors or findings positive for sleep apnea, more formal, extensive sleep laboratory or home testing, may be recommended.  Numerous patients, proven to have MAC-TEL, have improved or resolved their ey condition promptly, within weeks, of the use of nighttime oxygen supplementation or continuous positive airway pressure (CPAP).No signs of CME activity now that patient is continued on CPAP for over 2 years

## 2020-10-30 DIAGNOSIS — H401132 Primary open-angle glaucoma, bilateral, moderate stage: Secondary | ICD-10-CM | POA: Diagnosis not present

## 2020-11-01 ENCOUNTER — Other Ambulatory Visit: Payer: Self-pay | Admitting: Family Medicine

## 2020-11-01 DIAGNOSIS — J309 Allergic rhinitis, unspecified: Secondary | ICD-10-CM

## 2020-11-13 ENCOUNTER — Ambulatory Visit: Payer: Medicare PPO | Admitting: Family Medicine

## 2020-11-20 DIAGNOSIS — H04123 Dry eye syndrome of bilateral lacrimal glands: Secondary | ICD-10-CM | POA: Diagnosis not present

## 2020-11-20 DIAGNOSIS — H401132 Primary open-angle glaucoma, bilateral, moderate stage: Secondary | ICD-10-CM | POA: Diagnosis not present

## 2020-11-20 DIAGNOSIS — H16103 Unspecified superficial keratitis, bilateral: Secondary | ICD-10-CM | POA: Diagnosis not present

## 2020-12-06 ENCOUNTER — Other Ambulatory Visit: Payer: Self-pay | Admitting: Family Medicine

## 2020-12-06 DIAGNOSIS — I10 Essential (primary) hypertension: Secondary | ICD-10-CM

## 2020-12-17 DIAGNOSIS — M21621 Bunionette of right foot: Secondary | ICD-10-CM | POA: Diagnosis not present

## 2020-12-17 DIAGNOSIS — M65872 Other synovitis and tenosynovitis, left ankle and foot: Secondary | ICD-10-CM | POA: Diagnosis not present

## 2020-12-17 DIAGNOSIS — M65871 Other synovitis and tenosynovitis, right ankle and foot: Secondary | ICD-10-CM | POA: Diagnosis not present

## 2020-12-17 DIAGNOSIS — M2042 Other hammer toe(s) (acquired), left foot: Secondary | ICD-10-CM | POA: Diagnosis not present

## 2020-12-17 DIAGNOSIS — M21622 Bunionette of left foot: Secondary | ICD-10-CM | POA: Diagnosis not present

## 2020-12-26 ENCOUNTER — Other Ambulatory Visit: Payer: Self-pay | Admitting: Family Medicine

## 2020-12-26 DIAGNOSIS — E785 Hyperlipidemia, unspecified: Secondary | ICD-10-CM

## 2020-12-27 DIAGNOSIS — H35372 Puckering of macula, left eye: Secondary | ICD-10-CM | POA: Diagnosis not present

## 2020-12-27 DIAGNOSIS — H16223 Keratoconjunctivitis sicca, not specified as Sjogren's, bilateral: Secondary | ICD-10-CM | POA: Diagnosis not present

## 2020-12-27 DIAGNOSIS — H43813 Vitreous degeneration, bilateral: Secondary | ICD-10-CM | POA: Diagnosis not present

## 2021-01-07 DIAGNOSIS — M65871 Other synovitis and tenosynovitis, right ankle and foot: Secondary | ICD-10-CM | POA: Diagnosis not present

## 2021-01-07 DIAGNOSIS — M792 Neuralgia and neuritis, unspecified: Secondary | ICD-10-CM | POA: Diagnosis not present

## 2021-01-07 DIAGNOSIS — M65872 Other synovitis and tenosynovitis, left ankle and foot: Secondary | ICD-10-CM | POA: Diagnosis not present

## 2021-01-07 DIAGNOSIS — M21622 Bunionette of left foot: Secondary | ICD-10-CM | POA: Diagnosis not present

## 2021-01-16 ENCOUNTER — Ambulatory Visit (INDEPENDENT_AMBULATORY_CARE_PROVIDER_SITE_OTHER): Payer: Medicare PPO | Admitting: Family Medicine

## 2021-01-16 ENCOUNTER — Other Ambulatory Visit: Payer: Self-pay

## 2021-01-16 ENCOUNTER — Encounter: Payer: Self-pay | Admitting: Family Medicine

## 2021-01-16 VITALS — BP 170/100 | HR 80 | Temp 96.7°F | Ht 58.5 in | Wt 202.0 lb

## 2021-01-16 DIAGNOSIS — C50912 Malignant neoplasm of unspecified site of left female breast: Secondary | ICD-10-CM

## 2021-01-16 DIAGNOSIS — E7439 Other disorders of intestinal carbohydrate absorption: Secondary | ICD-10-CM

## 2021-01-16 DIAGNOSIS — Z1379 Encounter for other screening for genetic and chromosomal anomalies: Secondary | ICD-10-CM | POA: Diagnosis not present

## 2021-01-16 DIAGNOSIS — I10 Essential (primary) hypertension: Secondary | ICD-10-CM

## 2021-01-16 DIAGNOSIS — Z131 Encounter for screening for diabetes mellitus: Secondary | ICD-10-CM | POA: Diagnosis not present

## 2021-01-16 DIAGNOSIS — J309 Allergic rhinitis, unspecified: Secondary | ICD-10-CM | POA: Diagnosis not present

## 2021-01-16 DIAGNOSIS — E785 Hyperlipidemia, unspecified: Secondary | ICD-10-CM

## 2021-01-16 DIAGNOSIS — Z8669 Personal history of other diseases of the nervous system and sense organs: Secondary | ICD-10-CM | POA: Diagnosis not present

## 2021-01-16 DIAGNOSIS — M8589 Other specified disorders of bone density and structure, multiple sites: Secondary | ICD-10-CM

## 2021-01-16 DIAGNOSIS — G4733 Obstructive sleep apnea (adult) (pediatric): Secondary | ICD-10-CM

## 2021-01-16 LAB — POCT GLYCOSYLATED HEMOGLOBIN (HGB A1C): Hemoglobin A1C: 5.7 % — AB (ref 4.0–5.6)

## 2021-01-16 MED ORDER — MONTELUKAST SODIUM 10 MG PO TABS: 10.0000 mg | ORAL_TABLET | Freq: Every day | ORAL | 3 refills | Status: DC

## 2021-01-16 MED ORDER — PRAVASTATIN SODIUM 80 MG PO TABS: 80.0000 mg | ORAL_TABLET | Freq: Every day | ORAL | 3 refills | Status: DC

## 2021-01-16 NOTE — Addendum Note (Signed)
Addended by: Denita Lung on: 01/16/2021 01:18 PM   Modules accepted: Orders

## 2021-01-16 NOTE — Progress Notes (Signed)
Sarah Howell is a 80 y.o. female who presents for annual wellness visit and follow-up on chronic medical conditions.  She has no particular concerns or complaints.  She continues on pravastatin for her lipids.  She is also taking losartan regularly.  Her weight is unchanged.  She does have underlying allergies and is using Flonase as well as Singulair.  She does have a history of osteopenia and has been taking vitamin D as well as calcium.  Her last scan was in 2019.  She does have a family history of various kinds of cancers and has had genetic testing which was apparently negative.  She does have OSA and gets readouts quite regularly showing she is under good control.  She is followed regularly by ophthalmology for her underlying ophthalmologic issues.  She does have a history of glucose intolerance.  She has remote history of breast cancer and does get regular follow-up concerning that.  Immunizations and Health Maintenance Immunization History  Administered Date(s) Administered  . Fluad Quad(high Dose 65+) 07/08/2019, 07/03/2020  . Influenza Split 09/09/2012  . Influenza Whole 10/03/2009  . Influenza, High Dose Seasonal PF 07/19/2014, 07/24/2015, 08/25/2016, 07/30/2017, 07/15/2018  . Influenza,inj,Quad PF,6+ Mos 07/21/2013  . PFIZER(Purple Top)SARS-COV-2 Vaccination 11/11/2019, 11/30/2019, 07/03/2020  . Pneumococcal Conjugate-13 06/04/2017  . Pneumococcal Polysaccharide-23 10/03/2009, 07/19/2014  . Tdap 06/11/2016  . Zoster 10/28/2012  . Zoster Recombinat (Shingrix) 06/26/2017, 11/13/2017   Health Maintenance Due  Topic Date Due  . COVID-19 Vaccine (4 - Booster for Pfizer series) 12/31/2020    Last Pap smear: Aged out  Last mammogram: 04/27/20 Last colonoscopy: 01/06/18 Last DEXA: 09/07/18 Dentist: Q  Month  Ophtho: Q Three to six months Exercise:walking for at least 20 min a day   Other doctors caring for patient include:Dr Whitker eye, Dr. Zadie Rhine eye, Dr. Gerrianne Scale dermatology, Dr.  Collene Mares GI   Advanced directives: Does Patient Have a Medical Advance Directive?: Yes Type of Advance Directive: Living will Does patient want to make changes to medical advance directive?: Yes (Inpatient - patient defers changing a medical advance directive at this time - Information given)  Depression screen:  See questionnaire below.  Depression screen [facility] 2/9 01/16/2021 10/20/2019 06/17/2018 06/08/2018 06/04/2017  Decreased Interest 0 0 0 0 0  Down, Depressed, Hopeless 0 0 0 0 0  PHQ - 2 Score 0 0 0 0 0    Fall Risk Screen: see questionnaire below. Fall Risk  01/16/2021 10/20/2019 06/17/2018 06/08/2018 06/04/2017  Falls in the past year? 0 0 No No No  Number falls in past yr: 0 0 - - -  Injury with Fall? 0 0 - - -  Risk for fall due to : No Fall Risks - - - -  Follow up Falls evaluation completed - - - -    ADL screen:  See questionnaire below Functional Status Survey: Is the patient deaf or have difficulty hearing?: No Does the patient have difficulty seeing, even when wearing glasses/contacts?: No Does the patient have difficulty concentrating, remembering, or making decisions?: No Does the patient have difficulty walking or climbing stairs?: No Does the patient have difficulty dressing or bathing?: No Does the patient have difficulty doing errands alone such as visiting a doctor's office or shopping?: No   Review of Systems Constitutional: -, -unexpected weight change, -anorexia, -fatigue Allergy: -sneezing, -itching, -congestion Dermatology: denies changing moles, rash, lumps ENT: -runny nose, -ear pain, -sore throat,  Cardiology:  -chest pain, -palpitations, -orthopnea, Respiratory: -cough, -shortness of breath, -dyspnea  on exertion, -wheezing,  Gastroenterology: -abdominal pain, -nausea, -vomiting, -diarrhea, -constipation, -dysphagia Hematology: -bleeding or bruising problems Musculoskeletal: -arthralgias, -myalgias, -joint swelling, -back pain, - Ophthalmology: -vision  changes,  Urology: -dysuria, -difficulty urinating,  -urinary frequency, -urgency, incontinence Neurology: -, -numbness, , -memory loss, -falls, -dizziness  PHYSICAL EXAM: General Appearance: Alert, cooperative, no distress, appears stated age Head: Normocephalic, without obvious abnormality, atraumatic Eyes: PERRL, conjunctiva/corneas clear, EOM's intact,  Ears: Normal TM's and external ear canals Nose: Nares normal, mucosa normal, no drainage or sinus tenderness Throat: Lips, mucosa, and tongue normal; teeth and gums normal Neck: Supple, no lymphadenopathy;  thyroid:  no enlargement/tenderness/nodules; no carotid bruit or JVD Lungs: Clear to auscultation bilaterally without wheezes, rales or ronchi; respirations unlabored Heart: Regular rate and rhythm, S1 and S2 normal, no murmur, rubor gallop Abdomen: Soft, non-tender, nondistended, normoactive bowel sounds,  no masses, no hepatosplenomegaly Extremities: No clubbing, cyanosis or edema Pulses: 2+ and symmetric all extremities Skin:  Skin color, texture, turgor normal, no rashes or lesions Lymph nodes: Cervical, supraclavicular, and axillary nodes normal Neurologic:  CNII-XII intact, normal strength, sensation and gait; reflexes 2+ and symmetric throughout Psych: Normal mood, affect, hygiene and grooming. Hemoglobin A1c is 5.7 ASSESSMENT/PLAN: Osteopenia of multiple sites - Plan: DG Bone Density  Diabetes mellitus screening - Plan: POCT glycosylated hemoglobin (Hb A1C)  History of glaucoma  Primary hypertension  Hyperlipidemia LDL goal <100  Malignant neoplasm of left female breast, unspecified estrogen receptor status, unspecified site of breast (HCC)  Morbid obesity (HCC)  Allergic rhinitis, mild  Genetic testing  OSA (obstructive sleep apnea)  Glucose intolerance  She is to return here in 1 month for recheck on her blood pressure and possibly readjust of the medications.  Otherwise continue on present medication  regimen at as well as follow-up with ophthalmology.  DEXA scan ordered.  Continue with CPAP Discussed vitamin D intake Immunization recommendations discussed.  Colonoscopy recommendations reviewed   Medicare Attestation I have personally reviewed: The patient's medical and social history Their use of alcohol, tobacco or illicit drugs Their current medications and supplements The patient's functional ability including ADLs,fall risks, home safety risks, cognitive, and hearing and visual impairment Diet and physical activities Evidence for depression or mood disorders  The patient's weight, height, and BMI have been recorded in the chart.  I have made referrals, counseling, and provided education to the patient based on review of the above and I have provided the patient with a written personalized care plan for preventive services.     Jill Alexanders, MD   01/16/2021

## 2021-01-17 LAB — COMPREHENSIVE METABOLIC PANEL
ALT: 13 IU/L (ref 0–32)
AST: 19 IU/L (ref 0–40)
Albumin/Globulin Ratio: 1.2 (ref 1.2–2.2)
Albumin: 4.3 g/dL (ref 3.6–4.6)
Alkaline Phosphatase: 63 IU/L (ref 44–121)
BUN/Creatinine Ratio: 20 (ref 12–28)
BUN: 20 mg/dL (ref 8–27)
Bilirubin Total: 0.8 mg/dL (ref 0.0–1.2)
CO2: 21 mmol/L (ref 20–29)
Calcium: 10.1 mg/dL (ref 8.7–10.3)
Chloride: 103 mmol/L (ref 96–106)
Creatinine, Ser: 0.98 mg/dL (ref 0.57–1.00)
Globulin, Total: 3.7 g/dL (ref 1.5–4.5)
Glucose: 98 mg/dL (ref 65–99)
Potassium: 4.5 mmol/L (ref 3.5–5.2)
Sodium: 138 mmol/L (ref 134–144)
Total Protein: 8 g/dL (ref 6.0–8.5)
eGFR: 58 mL/min/{1.73_m2} — ABNORMAL LOW (ref >59)

## 2021-01-17 LAB — LIPID PANEL
Chol/HDL Ratio: 3.6 ratio (ref 0.0–4.4)
Cholesterol, Total: 176 mg/dL (ref 100–199)
HDL: 49 mg/dL (ref >39)
LDL Chol Calc (NIH): 105 mg/dL — ABNORMAL HIGH (ref 0–99)
Triglycerides: 125 mg/dL (ref 0–149)
VLDL Cholesterol Cal: 22 mg/dL (ref 5–40)

## 2021-01-17 LAB — CBC WITH DIFFERENTIAL/PLATELET
Basophils Absolute: 0 10*3/uL (ref 0.0–0.2)
Basos: 1 %
EOS (ABSOLUTE): 0.1 10*3/uL (ref 0.0–0.4)
Eos: 1 %
Hematocrit: 40.9 % (ref 34.0–46.6)
Hemoglobin: 13.7 g/dL (ref 11.1–15.9)
Immature Grans (Abs): 0 10*3/uL (ref 0.0–0.1)
Immature Granulocytes: 0 %
Lymphocytes Absolute: 2.7 10*3/uL (ref 0.7–3.1)
Lymphs: 35 %
MCH: 31.6 pg (ref 26.6–33.0)
MCHC: 33.5 g/dL (ref 31.5–35.7)
MCV: 95 fL (ref 79–97)
Monocytes Absolute: 0.7 10*3/uL (ref 0.1–0.9)
Monocytes: 9 %
Neutrophils Absolute: 4.2 10*3/uL (ref 1.4–7.0)
Neutrophils: 54 %
Platelets: 229 10*3/uL (ref 150–450)
RBC: 4.33 x10E6/uL (ref 3.77–5.28)
RDW: 13.5 % (ref 11.7–15.4)
WBC: 7.8 10*3/uL (ref 3.4–10.8)

## 2021-02-11 ENCOUNTER — Telehealth: Payer: Self-pay

## 2021-02-11 ENCOUNTER — Other Ambulatory Visit: Payer: Self-pay | Admitting: Family Medicine

## 2021-02-11 DIAGNOSIS — M8589 Other specified disorders of bone density and structure, multiple sites: Secondary | ICD-10-CM

## 2021-02-11 NOTE — Telephone Encounter (Signed)
Sending to [facility] and pt was advised. [place]

## 2021-02-11 NOTE — Telephone Encounter (Signed)
Pt. Called stating that she got a call from [facility] to schedule a DEXA scan but she usually uses [facility]. She wanted to check and make sure the referral was sent to the right location. Did Dr. Redmond School want her to get it done at GI or at [facility]. GI told her that she would be starting over there since she usually gets it at [place].

## 2021-02-12 DIAGNOSIS — H02889 Meibomian gland dysfunction of unspecified eye, unspecified eyelid: Secondary | ICD-10-CM | POA: Diagnosis not present

## 2021-02-12 DIAGNOSIS — H35372 Puckering of macula, left eye: Secondary | ICD-10-CM | POA: Diagnosis not present

## 2021-02-12 DIAGNOSIS — H43813 Vitreous degeneration, bilateral: Secondary | ICD-10-CM | POA: Diagnosis not present

## 2021-02-12 DIAGNOSIS — H02403 Unspecified ptosis of bilateral eyelids: Secondary | ICD-10-CM | POA: Diagnosis not present

## 2021-02-12 DIAGNOSIS — H16223 Keratoconjunctivitis sicca, not specified as Sjogren's, bilateral: Secondary | ICD-10-CM | POA: Diagnosis not present

## 2021-02-14 DIAGNOSIS — M8589 Other specified disorders of bone density and structure, multiple sites: Secondary | ICD-10-CM | POA: Diagnosis not present

## 2021-02-14 LAB — HM DEXA SCAN

## 2021-02-15 NOTE — Progress Notes (Signed)
Called pt to advise  To continue her vitamin d and calcium. [place]

## 2021-02-18 ENCOUNTER — Ambulatory Visit: Payer: Medicare PPO | Admitting: Family Medicine

## 2021-02-18 ENCOUNTER — Encounter: Payer: Self-pay | Admitting: Family Medicine

## 2021-02-18 ENCOUNTER — Other Ambulatory Visit: Payer: Self-pay

## 2021-02-18 VITALS — BP 160/84 | HR 98 | Temp 96.4°F | Ht 59.0 in | Wt 201.8 lb

## 2021-02-18 DIAGNOSIS — Z23 Encounter for immunization: Secondary | ICD-10-CM

## 2021-02-18 DIAGNOSIS — I10 Essential (primary) hypertension: Secondary | ICD-10-CM | POA: Diagnosis not present

## 2021-02-18 MED ORDER — LOSARTAN POTASSIUM-HCTZ 50-12.5 MG PO TABS: 1.0000 | ORAL_TABLET | Freq: Every day | ORAL | 1 refills | Status: DC

## 2021-02-18 NOTE — Patient Instructions (Signed)
Work up to 20 minutes of something physical every day.

## 2021-02-18 NOTE — Progress Notes (Signed)
   Subjective:    Patient ID: Sarah Howell, female    DOB: 18-Jan-1941, 80 y.o.   MRN: [id number]  HPI She is here for a recheck on her blood pressure.  She has been taking the losartan and having no difficulty with that.  She has not very physically active.   Review of Systems     Objective:   Physical Exam Alert and in no distress.  Blood pressure is recorded.       Assessment & Plan:  Primary hypertension  Morbid obesity (HCC)  Immunization, viral disease - Plan: PFIZER Comirnaty(GRAY TOP)COVID-19 Vaccine I will switch her to Hyzaar 50/12.5.  Encouraged her to become more physically active to the point of eventually walking 20 minutes on a daily basis.  Recheck here in 1 month.

## 2021-02-20 ENCOUNTER — Encounter: Payer: Self-pay | Admitting: Family Medicine

## 2021-02-26 DIAGNOSIS — F4321 Adjustment disorder with depressed mood: Secondary | ICD-10-CM | POA: Diagnosis not present

## 2021-02-26 DIAGNOSIS — Z634 Disappearance and death of family member: Secondary | ICD-10-CM | POA: Diagnosis not present

## 2021-02-26 DIAGNOSIS — Z853 Personal history of malignant neoplasm of breast: Secondary | ICD-10-CM | POA: Diagnosis not present

## 2021-02-26 DIAGNOSIS — Z800 Family history of malignant neoplasm of digestive organs: Secondary | ICD-10-CM | POA: Diagnosis not present

## 2021-02-26 LAB — HM MAMMOGRAPHY

## 2021-02-27 ENCOUNTER — Encounter: Payer: Self-pay | Admitting: Family Medicine

## 2021-03-05 DIAGNOSIS — H43813 Vitreous degeneration, bilateral: Secondary | ICD-10-CM | POA: Diagnosis not present

## 2021-03-05 DIAGNOSIS — H02403 Unspecified ptosis of bilateral eyelids: Secondary | ICD-10-CM | POA: Diagnosis not present

## 2021-03-05 DIAGNOSIS — H16223 Keratoconjunctivitis sicca, not specified as Sjogren's, bilateral: Secondary | ICD-10-CM | POA: Diagnosis not present

## 2021-03-05 DIAGNOSIS — H35372 Puckering of macula, left eye: Secondary | ICD-10-CM | POA: Diagnosis not present

## 2021-03-05 DIAGNOSIS — H02889 Meibomian gland dysfunction of unspecified eye, unspecified eyelid: Secondary | ICD-10-CM | POA: Diagnosis not present

## 2021-03-13 ENCOUNTER — Other Ambulatory Visit: Payer: Self-pay | Admitting: Family Medicine

## 2021-03-13 DIAGNOSIS — Z23 Encounter for immunization: Secondary | ICD-10-CM

## 2021-03-14 ENCOUNTER — Other Ambulatory Visit: Payer: Self-pay | Admitting: Family Medicine

## 2021-03-19 ENCOUNTER — Other Ambulatory Visit: Payer: Self-pay | Admitting: Family Medicine

## 2021-03-19 ENCOUNTER — Other Ambulatory Visit: Payer: Self-pay

## 2021-03-19 ENCOUNTER — Encounter: Payer: Self-pay | Admitting: Family Medicine

## 2021-03-19 ENCOUNTER — Ambulatory Visit: Payer: Medicare PPO | Admitting: Family Medicine

## 2021-03-19 VITALS — BP 136/88 | HR 84 | Temp 96.6°F | Wt 201.6 lb

## 2021-03-19 DIAGNOSIS — I10 Essential (primary) hypertension: Secondary | ICD-10-CM

## 2021-03-19 DIAGNOSIS — Z1231 Encounter for screening mammogram for malignant neoplasm of breast: Secondary | ICD-10-CM

## 2021-03-19 MED ORDER — LOSARTAN POTASSIUM-HCTZ 100-12.5 MG PO TABS: 1.0000 | ORAL_TABLET | Freq: Every day | ORAL | 1 refills | Status: DC

## 2021-03-19 NOTE — Progress Notes (Signed)
   Subjective:    Patient ID: Sarah Howell, female    DOB: [birth year], 80 y.o.   MRN: [id number]  HPI She is here for recheck on her blood pressure.  She has been taking the medication and having no difficulty with that.   Review of Systems     Objective:   Physical Exam Alert and in no distress.  Blood pressure is recorded       Assessment & Plan:  Primary hypertension - Plan: losartan-hydrochlorothiazide (HYZAAR) 100-12.5 MG tablet Again encouraged her to become more physically active.  Recheck here in 1 month.

## 2021-03-28 ENCOUNTER — Other Ambulatory Visit: Payer: Self-pay

## 2021-03-28 ENCOUNTER — Encounter: Payer: Self-pay | Admitting: Family Medicine

## 2021-03-28 ENCOUNTER — Ambulatory Visit: Payer: Medicare PPO | Admitting: Family Medicine

## 2021-03-28 VITALS — BP 136/82 | HR 92 | Temp 96.0°F | Wt 198.6 lb

## 2021-03-28 DIAGNOSIS — H11433 Conjunctival hyperemia, bilateral: Secondary | ICD-10-CM

## 2021-03-28 DIAGNOSIS — H53143 Visual discomfort, bilateral: Secondary | ICD-10-CM | POA: Diagnosis not present

## 2021-03-28 NOTE — Progress Notes (Signed)
   Subjective:    Patient ID: Sarah Howell, female    DOB: 1941-06-29, 80 y.o.   MRN: [id number]  HPI She is here for consult concerning difficulty with her eyes.  Apparently she saw her ophthalmologist, Dr. Venetia Maxon about a month ago and he apparently gave her some drops.  She does have a history of glaucoma as well as cataract extraction.  She did not bring the medications in with her so not sure what she is taking.  In the last several days she has noted difficulty with redness, some drainage from the eyes and photophobia.   Review of Systems     Objective:   Physical Exam  Exam of both eyes does show erythema.  Her eyes had to be manually opened because of the photophobia.  Pupils were slightly dilated.      Assessment & Plan:  Photophobia of both eyes - Plan: Ambulatory referral to Ophthalmology  Conjunctival injection, bilateral - Plan: Ambulatory referral to Ophthalmology She is scheduled to be seen tomorrow.

## 2021-03-29 DIAGNOSIS — H02035 Senile entropion of left lower eyelid: Secondary | ICD-10-CM | POA: Diagnosis not present

## 2021-03-29 DIAGNOSIS — H04123 Dry eye syndrome of bilateral lacrimal glands: Secondary | ICD-10-CM | POA: Diagnosis not present

## 2021-03-29 DIAGNOSIS — H02032 Senile entropion of right lower eyelid: Secondary | ICD-10-CM | POA: Diagnosis not present

## 2021-03-29 DIAGNOSIS — Z961 Presence of intraocular lens: Secondary | ICD-10-CM | POA: Diagnosis not present

## 2021-03-29 DIAGNOSIS — H401130 Primary open-angle glaucoma, bilateral, stage unspecified: Secondary | ICD-10-CM | POA: Diagnosis not present

## 2021-04-03 DIAGNOSIS — H02032 Senile entropion of right lower eyelid: Secondary | ICD-10-CM | POA: Diagnosis not present

## 2021-04-03 DIAGNOSIS — H02035 Senile entropion of left lower eyelid: Secondary | ICD-10-CM | POA: Diagnosis not present

## 2021-04-05 ENCOUNTER — Encounter: Payer: Self-pay | Admitting: Family Medicine

## 2021-04-11 ENCOUNTER — Other Ambulatory Visit: Payer: Self-pay | Admitting: Family Medicine

## 2021-04-11 DIAGNOSIS — I10 Essential (primary) hypertension: Secondary | ICD-10-CM

## 2021-04-17 DIAGNOSIS — L121 Cicatricial pemphigoid: Secondary | ICD-10-CM | POA: Diagnosis not present

## 2021-04-17 DIAGNOSIS — H02035 Senile entropion of left lower eyelid: Secondary | ICD-10-CM | POA: Diagnosis not present

## 2021-04-17 DIAGNOSIS — H02032 Senile entropion of right lower eyelid: Secondary | ICD-10-CM | POA: Diagnosis not present

## 2021-04-17 DIAGNOSIS — H401130 Primary open-angle glaucoma, bilateral, stage unspecified: Secondary | ICD-10-CM | POA: Diagnosis not present

## 2021-04-18 ENCOUNTER — Other Ambulatory Visit: Payer: Self-pay

## 2021-04-18 ENCOUNTER — Encounter: Payer: Self-pay | Admitting: Family Medicine

## 2021-04-18 ENCOUNTER — Ambulatory Visit: Payer: Medicare PPO | Admitting: Family Medicine

## 2021-04-18 VITALS — BP 124/80 | Temp 98.1°F | Wt 198.8 lb

## 2021-04-18 DIAGNOSIS — I10 Essential (primary) hypertension: Secondary | ICD-10-CM

## 2021-04-18 NOTE — Progress Notes (Signed)
   Subjective:    Patient ID: Sarah Howell, female    DOB: 1941-08-01, 80 y.o.   MRN: [id number]  HPI She is here for recheck on her blood pressure.  She has been consistent with taking her blood pressure medicine regularly.   Review of Systems     Objective:   Physical Exam Alert and in no distress.  Blood pressure is recorded.       Assessment & Plan:  Primary hypertension Continue on present medication regimen.

## 2021-04-22 DIAGNOSIS — H02013 Cicatricial entropion of right eye, unspecified eyelid: Secondary | ICD-10-CM | POA: Diagnosis not present

## 2021-04-22 DIAGNOSIS — H02016 Cicatricial entropion of left eye, unspecified eyelid: Secondary | ICD-10-CM | POA: Diagnosis not present

## 2021-04-22 DIAGNOSIS — H16223 Keratoconjunctivitis sicca, not specified as Sjogren's, bilateral: Secondary | ICD-10-CM | POA: Diagnosis not present

## 2021-04-22 DIAGNOSIS — L121 Cicatricial pemphigoid: Secondary | ICD-10-CM | POA: Diagnosis not present

## 2021-04-22 DIAGNOSIS — Z961 Presence of intraocular lens: Secondary | ICD-10-CM | POA: Diagnosis not present

## 2021-04-22 DIAGNOSIS — H401130 Primary open-angle glaucoma, bilateral, stage unspecified: Secondary | ICD-10-CM | POA: Diagnosis not present

## 2021-04-25 ENCOUNTER — Encounter (INDEPENDENT_AMBULATORY_CARE_PROVIDER_SITE_OTHER): Payer: Self-pay | Admitting: Ophthalmology

## 2021-04-25 ENCOUNTER — Ambulatory Visit (INDEPENDENT_AMBULATORY_CARE_PROVIDER_SITE_OTHER): Payer: Medicare PPO | Admitting: Ophthalmology

## 2021-04-25 ENCOUNTER — Other Ambulatory Visit: Payer: Self-pay

## 2021-04-25 DIAGNOSIS — H35071 Retinal telangiectasis, right eye: Secondary | ICD-10-CM

## 2021-04-25 DIAGNOSIS — H02045 Spastic entropion of left lower eyelid: Secondary | ICD-10-CM | POA: Diagnosis not present

## 2021-04-25 DIAGNOSIS — G4733 Obstructive sleep apnea (adult) (pediatric): Secondary | ICD-10-CM | POA: Diagnosis not present

## 2021-04-25 DIAGNOSIS — H35073 Retinal telangiectasis, bilateral: Secondary | ICD-10-CM | POA: Diagnosis not present

## 2021-04-25 DIAGNOSIS — H35072 Retinal telangiectasis, left eye: Secondary | ICD-10-CM

## 2021-04-25 DIAGNOSIS — H02042 Spastic entropion of right lower eyelid: Secondary | ICD-10-CM

## 2021-04-25 DIAGNOSIS — H02046 Spastic entropion of left eye, unspecified eyelid: Secondary | ICD-10-CM

## 2021-04-25 NOTE — Assessment & Plan Note (Signed)
Stable by OCT observe

## 2021-04-25 NOTE — Assessment & Plan Note (Signed)
Bilateral, diagnosed by Dr. Nathanial Rancher of [facility], with hypertrophic anterior lamellae and rolling of the inferior lid.  Secondary corneal epithelial minor trauma apparent now with no infiltrates.  Patient has scheduled appointment at [facility], oculoplastic service in the near future

## 2021-04-25 NOTE — Progress Notes (Signed)
04/25/2021     CHIEF COMPLAINT Patient presents for Retina Follow Up (6 Mo F/U OU//Pt c/o blurry VA OU, worsening over time. Pt sts, "I don't read" OU. Pt sts she is unable to text. Pt sts she stopped using CPAP approx 1 mo ago. Pt sts CPAP was "blowing everywhere." Pt sts, "I keep my eyes closed the majority of the time." Pt c/o light sensitivity OU. Pt sts she has eval with [facility] in August for her eyelids OU. Pt sts she has been seeing Dr. Clent Jacks for lids and cornea OU.)   HISTORY OF PRESENT ILLNESS: Sarah Howell is a 80 y.o. female who presents to the clinic today for:   HPI     Retina Follow Up           Diagnosis: Other   Laterality: both eyes   Onset: 6 months ago   Severity: mild   Duration: 6 months   Course: gradually worsening   Comments: 6 Mo F/U OU  Pt c/o blurry VA OU, worsening over time. Pt sts, "I don't read" OU. Pt sts she is unable to text. Pt sts she stopped using CPAP approx 1 mo ago. Pt sts CPAP was "blowing everywhere." Pt sts, "I keep my eyes closed the majority of the time." Pt c/o light sensitivity OU. Pt sts she has eval with [facility] in August for her eyelids OU. Pt sts she has been seeing Dr. Clent Jacks for lids and cornea OU.       Last edited by Milly Jakob, [place] on 04/25/2021 10:56 AM.      Referring physician: Denita Lung, MD [place],  [place] [id number]  HISTORICAL INFORMATION:   Selected notes from the MEDICAL RECORD NUMBER    Lab Results  Component Value Date   HGBA1C 5.7 (A) 01/16/2021     CURRENT MEDICATIONS: Current Outpatient Medications (Ophthalmic Drugs)  Medication Sig   dorzolamide-timolol (COSOPT) 22.3-6.8 MG/ML ophthalmic solution Place 1 drop into both eyes 2 (two) times daily.   erythromycin ophthalmic ointment SMARTSIG:Sparingly In Eye(s) 3 Times Daily   cycloSPORINE (CEQUA OP) Apply 1 drop to eye 2 (two) times daily.   Loteprednol Etabonate (LOTEMAX SM) 0.38 % GEL Apply to eye.  (Patient not taking: Reported on 04/18/2021)   Propylene Glycol (SYSTANE COMPLETE OP) Apply to eye.   No current facility-administered medications for this visit. (Ophthalmic Drugs)   Current Outpatient Medications (Other)  Medication Sig   Calcium Carbonate-Vitamin D (CALCIUM + D PO) Take 1 tablet by mouth 2 (two) times daily.   finasteride (PROSCAR) 5 MG tablet Take 0.5 tablets (2.5 mg total) by mouth daily. Patient uses for hair growth.   fluticasone (FLONASE) 50 MCG/ACT nasal spray PLACE 2 SPRAYS INTO THE NOSE DAILY.   losartan-hydrochlorothiazide (HYZAAR) 100-12.5 MG tablet TAKE 1 TABLET BY MOUTH EVERY DAY   montelukast (SINGULAIR) 10 MG tablet Take 1 tablet (10 mg total) by mouth at bedtime.   pravastatin (PRAVACHOL) 80 MG tablet Take 1 tablet (80 mg total) by mouth daily.   No current facility-administered medications for this visit. (Other)      REVIEW OF SYSTEMS:    ALLERGIES Allergies  Allergen Reactions   Fish Allergy Nausea And Vomiting   Other Nausea And Vomiting    PAST MEDICAL HISTORY Past Medical History:  Diagnosis Date   Allergy    Alopecia    Breast cancer ([place]) 1989   left breast cancer; s/p mastectomy and reconstruction  Cancer ([place])    BREAST   Cough    Cystoid macular edema of both eyes 10/25/2020   Resolved since 2019 commensurate with institution of nightly use of CPAP to treat active MAC-TEL   Glaucoma    bilateral   Hyperlipidemia    Hypertension    dr Demetrios Isaacs    pcp   Osteopenia    Shortness of breath    WITH EXERTION    Past Surgical History:  Procedure Laterality Date   BREAST SURGERY     lft br mastectomy and reconstruction   CATARACT EXTRACTION W/PHACO  12/10/2011   Procedure: CATARACT EXTRACTION PHACO AND INTRAOCULAR LENS PLACEMENT ([place]);  Surgeon: Marylynn Pearson, MD;  Location: [facility];  Service: Ophthalmology;  Laterality: Right;   CATARACT EXTRACTION W/PHACO  02/04/2012   Procedure: CATARACT EXTRACTION PHACO AND INTRAOCULAR LENS  PLACEMENT ([facility]);  Surgeon: Marylynn Pearson, MD;  Location: [facility];  Service: Ophthalmology;  Laterality: Left;   EYE SURGERY Bilateral 2013   cataract surgery   lt breast ca reconstruction  07/28/1988   MASTECTOMY Left 1989   Orthoscopic surgery lt knee  2002   Orthoscopic surgery rt knee  08/05/2009   Rotoator cuff cleaning      FAMILY HISTORY Family History  Problem Relation Age of Onset   Colon cancer Mother        dx. 7s; "recurrence" at 59   Prostate cancer Father        dx. 65s   Pancreatic cancer Brother        dx. 81-82   Breast cancer Sister 3   Pancreatic cancer Sister    Parkinson's disease Brother    Prostate cancer Son 76   Cancer Other        unspecified type   Diabetes Paternal Aunt    Diabetes Paternal Uncle    Breast cancer Cousin        dx. <70s   Breast cancer Cousin        dx. >50    SOCIAL HISTORY Social History   Tobacco Use   Smoking status: Never   Smokeless tobacco: Never  Substance Use Topics   Alcohol use: No    Alcohol/week: 0.0 standard drinks   Drug use: No         OPHTHALMIC EXAM:  Base Eye Exam     Visual Acuity (ETDRS)       Right Left   Dist [address] 20/100 20/80 +2   Dist ph [place] 20/60ecc +1 20/50 +2         Tonometry (Tonopen, 10:56 AM)       Right Left   Pressure 15 15         Pupils       Pupils Dark Light Shape React APD   Right PERRL 5 4 Round Slow None   Left PERRL 5 4 Round Slow None         Visual Fields (Counting fingers)       Left Right    Full Full         Extraocular Movement       Right Left    Full Full         Neuro/Psych     Oriented x3: Yes   Mood/Affect: Normal         Dilation     Both eyes: 1.0% Mydriacyl, 2.5% Phenylephrine @ 10:58 AM           Slit Lamp and Fundus Exam  External Exam       Right Left   External Normal Normal         Slit Lamp Exam       Right Left   Lids/Lashes Entropion, spastic inferior lid, with trichiasis Entropion, spastic  inferior lid, with trichiasis   Conjunctiva/Sclera [place] and quiet [place] and quiet   Cornea Clear, minor epi irregularities, no defects, no infiltrates Clear   Anterior Chamber Deep and quiet Deep and quiet   Iris Round and reactive Round and reactive   Lens Posterior chamber intraocular lens Posterior chamber intraocular lens   Anterior Vitreous Normal Normal         Fundus Exam       Right Left   Posterior Vitreous Normal Normal   Disc Normal Normal   C/D Ratio 0.65 0.75   Macula Drusen, Hard drusen Drusen, Hard drusen   Vessels Normal Normal   Periphery Normal Normal            IMAGING AND PROCEDURES  Imaging and Procedures for 04/25/21           ASSESSMENT/PLAN:  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 Nathanial Rancher of [facility], with hypertrophic anterior lamellae and rolling of the inferior lid.  Secondary corneal epithelial minor trauma apparent now with no infiltrates.  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 Type 2 macular telangiectasis, right  H35.071 OCT, Retina - OU - Both Eyes    3. Entropion, spastic, left  H02.046     4. Spastic entropion of both lower eyelids  H02.042    H02.045     5. OSA (obstructive sleep apnea)  G47.33       1.  Patient encouraged to resume CPAP if at all possible to oral in order to keep her sense of wellbeing maximally improved.  2.  No specific other retinal therapy  warranted at this time.  3.  We will follow-up in 10 to 12 weeks when patient has eyelid surgery correction complete  Ophthalmic Meds Ordered this visit:  No orders of the defined types were placed in this encounter.      Return in about 10 weeks (around 07/04/2021) for DILATE OU, OCT.  There are no Patient Instructions on file for this visit.   Explained the diagnoses, plan, and follow up with the patient and they expressed understanding.  Patient expressed understanding of the importance of proper follow up care.   Clent Demark Alicja Everitt M.D. Diseases & Surgery of the Retina and Vitreous [facility]ina [facility] Diabetic [place] 04/25/21     Abbreviations: M myopia (nearsighted); A astigmatism; H hyperopia (farsighted); P presbyopia; Mrx spectacle prescription;  CTL contact lenses; OD right eye; OS left eye; OU both eyes  XT exotropia; ET esotropia; PEK punctate epithelial keratitis; PEE punctate epithelial erosions; DES dry eye syndrome; MGD meibomian gland dysfunction; ATs artificial tears; PFAT's preservative  free artificial tears; [place] nuclear sclerotic cataract; PSC posterior subcapsular cataract; ERM epi-retinal membrane; PVD posterior vitreous detachment; RD retinal detachment; DM diabetes mellitus; DR diabetic retinopathy; NPDR non-proliferative diabetic retinopathy; PDR proliferative diabetic retinopathy; CSME clinically significant macular edema; DME diabetic macular edema; dbh dot blot hemorrhages; CWS cotton wool spot; POAG primary open angle glaucoma; C/D cup-to-disc ratio; HVF humphrey visual field; GVF goldmann visual field; OCT optical coherence tomography; IOP intraocular pressure; BRVO Branch retinal vein occlusion; CRVO central retinal vein occlusion; CRAO central retinal artery occlusion; BRAO branch retinal artery occlusion; RT retinal tear; SB scleral buckle; PPV pars plana vitrectomy; VH Vitreous hemorrhage; PRP panretinal laser photocoagulation; IVK intravitreal kenalog; VMT vitreomacular  traction; MH Macular hole;  NVD neovascularization of the disc; NVE neovascularization elsewhere; AREDS age related eye disease study; ARMD age related macular degeneration; POAG primary open angle glaucoma; EBMD epithelial/anterior basement membrane dystrophy; ACIOL anterior chamber intraocular lens; IOL intraocular lens; PCIOL posterior chamber intraocular lens; Phaco/IOL phacoemulsification with intraocular lens placement; [place] photorefractive keratectomy; LASIK laser assisted in situ keratomileusis; HTN hypertension; DM diabetes mellitus; COPD chronic obstructive pulmonary disease

## 2021-04-25 NOTE — Assessment & Plan Note (Signed)
Patient not using CPAP for the last month we will follow-up therefore in 6 to 8 weeks to look for signs of recurrence of active MAC-TEL and macular edema as noted in the past prior to onset of usage

## 2021-05-07 DIAGNOSIS — H02013 Cicatricial entropion of right eye, unspecified eyelid: Secondary | ICD-10-CM | POA: Diagnosis not present

## 2021-05-07 DIAGNOSIS — L121 Cicatricial pemphigoid: Secondary | ICD-10-CM | POA: Diagnosis not present

## 2021-05-07 DIAGNOSIS — H16223 Keratoconjunctivitis sicca, not specified as Sjogren's, bilateral: Secondary | ICD-10-CM | POA: Diagnosis not present

## 2021-05-07 DIAGNOSIS — H02016 Cicatricial entropion of left eye, unspecified eyelid: Secondary | ICD-10-CM | POA: Diagnosis not present

## 2021-05-07 DIAGNOSIS — H401130 Primary open-angle glaucoma, bilateral, stage unspecified: Secondary | ICD-10-CM | POA: Diagnosis not present

## 2021-05-07 DIAGNOSIS — R519 Headache, unspecified: Secondary | ICD-10-CM | POA: Diagnosis not present

## 2021-05-08 ENCOUNTER — Ambulatory Visit: Payer: Medicare PPO | Admitting: Medical

## 2021-05-08 ENCOUNTER — Other Ambulatory Visit: Payer: Self-pay

## 2021-05-08 VITALS — BP 134/84 | HR 87 | Temp 96.1°F | Wt 194.6 lb

## 2021-05-08 DIAGNOSIS — H409 Unspecified glaucoma: Secondary | ICD-10-CM | POA: Diagnosis not present

## 2021-05-08 DIAGNOSIS — G4489 Other headache syndrome: Secondary | ICD-10-CM

## 2021-05-08 DIAGNOSIS — Z124 Encounter for screening for malignant neoplasm of cervix: Secondary | ICD-10-CM | POA: Diagnosis not present

## 2021-05-08 DIAGNOSIS — H5713 Ocular pain, bilateral: Secondary | ICD-10-CM

## 2021-05-08 DIAGNOSIS — H02009 Unspecified entropion of unspecified eye, unspecified eyelid: Secondary | ICD-10-CM

## 2021-05-08 DIAGNOSIS — Z780 Asymptomatic menopausal state: Secondary | ICD-10-CM | POA: Diagnosis not present

## 2021-05-08 DIAGNOSIS — Z853 Personal history of malignant neoplasm of breast: Secondary | ICD-10-CM | POA: Diagnosis not present

## 2021-05-08 DIAGNOSIS — Z01419 Encounter for gynecological examination (general) (routine) without abnormal findings: Secondary | ICD-10-CM | POA: Diagnosis not present

## 2021-05-08 MED ORDER — GABAPENTIN 100 MG PO CAPS: 100.0000 mg | ORAL_CAPSULE | Freq: Two times a day (BID) | ORAL | 1 refills | Status: DC

## 2021-05-08 MED ORDER — HYDROCODONE-ACETAMINOPHEN 5-325 MG PO TABS: 1.0000 | ORAL_TABLET | Freq: Every day | ORAL | 0 refills | Status: DC | PRN

## 2021-05-08 NOTE — Progress Notes (Signed)
Subjective:  Sarah Howell is a 80 y.o. female who presents for Chief Complaint  Patient presents with   Headache    For greater than a week     Here with husband.  Here for headache x 1 week.  No hx/o headache disorder.  She attributes the headache to her eye condition.  Started about a week ago with eye pain.   She has seen her regular eye doctor and specialist.   Dr. Katy Howell gave her a new diagnosis.   She has had eye pain and headache all week since eye pain started.   Dr. Katy Howell advised she come here to discus pain control.   She has been referred to Sarah Howell for a 3rd specialist.  She doesn't see them for a few week.   Needs something for pain now.     Denies numbness, tingling, weakness, slurred speech, confusion, fall, weakness.   No incontinence.     No other aggravating or relieving factors.    No other c/o.  Past Medical History:  Diagnosis Date   Allergy    Alopecia    Breast cancer ([place]) 1989   left breast cancer; s/p mastectomy and reconstruction   Cancer ([place])    BREAST   Cough    Cystoid macular edema of both eyes 10/25/2020   Resolved since 2019 commensurate with institution of nightly use of CPAP to treat active MAC-TEL   Glaucoma    bilateral   Hyperlipidemia    Hypertension    dr Sarah Howell    pcp   Osteopenia    Shortness of breath    WITH EXERTION    Current Outpatient Medications on File Prior to Visit  Medication Sig Dispense Refill   acetaminophen (TYLENOL) 500 MG tablet Take 500 mg by mouth every 6 (six) hours as needed.     dorzolamide-timolol (COSOPT) 22.3-6.8 MG/ML ophthalmic solution Place 1 drop into both eyes 2 (two) times daily.     erythromycin ophthalmic ointment SMARTSIG:Sparingly In Eye(s) 3 Times Daily     finasteride (PROSCAR) 5 MG tablet Take 0.5 tablets (2.5 mg total) by mouth daily. Patient uses for hair growth. 30 tablet 0   fluticasone (FLONASE) 50 MCG/ACT nasal spray PLACE 2 SPRAYS INTO THE NOSE DAILY. 48 mL 1   losartan-hydrochlorothiazide  (HYZAAR) 100-12.5 MG tablet TAKE 1 TABLET BY MOUTH EVERY DAY 90 tablet 1   montelukast (SINGULAIR) 10 MG tablet Take 1 tablet (10 mg total) by mouth at bedtime. 90 tablet 3   pravastatin (PRAVACHOL) 80 MG tablet Take 1 tablet (80 mg total) by mouth daily. 90 tablet 3   Propylene Glycol (SYSTANE COMPLETE OP) Apply to eye.     Calcium Carbonate-Vitamin D (CALCIUM + D PO) Take 1 tablet by mouth 2 (two) times daily. (Patient not taking: Reported on 05/08/2021)     cycloSPORINE (CEQUA OP) Apply 1 drop to eye 2 (two) times daily. (Patient not taking: Reported on 05/08/2021)     Loteprednol Etabonate (LOTEMAX SM) 0.38 % GEL Apply to eye. (Patient not taking: No sig reported)     No current facility-administered medications on file prior to visit.     The following portions of the patient's history were reviewed and updated as appropriate: allergies, current medications, past family history, past medical history, past social history, past surgical history and problem list.  ROS Otherwise as in subjective above    Objective: BP 134/84   Pulse 87   Temp (!) 96.1 F (35.6 C)  Wt 194 lb 9.6 oz (88.3 kg)   SpO2 94%   BMI 39.30 kg/m   General appearance: alert, no distress, well developed, well nourished, African American female Seated with eyes closed.   Very sensitive to eye motion and light Neuro: Limited eye exam.  Otherwise CN 2-12 intact, otherwise nonfocal exam Neck: supple, no lymphadenopathy, no thyromegaly, no masses Heart: RRR, normal S1, S2, no murmurs Lungs: CTA bilaterally, no wheezes, rhonchi, or rales Pulses: 2+ radial pulses, 2+ pedal pulses, normal cap refill Ext: no edema    Assessment: Encounter Diagnoses  Name Primary?   Entropion, unspecified laterality Yes   Eye pain, bilateral    Headache syndrome    Glaucoma, unspecified glaucoma type, unspecified laterality      Plan: Discussed symptoms, concerns.   I reviewed eye doctor notes from Sarah Howell from  04/03/21.  She was diagnosed with involutional entropion bilateral.  His notes suggest this is quite painful with constant irritation.  He gave her several ways to help decrease the risk of corneal infection or abrasion including Emycin ointment and lubrication.     She has been referred to specialist.    Discussed recommendations as below.  Patient Instructions  Recommendations: Begin Gabapentin neuropathic pain medication twice daily.  Caution as this can cause sedation. Begin Norco Hydrocodone pain medication.  This can be taken either once daily when the pain is worse.  OR this can be taken 1/2 tablet twice daily.     So for example you could take Gabapentin at breakfast, then 1/2 Hydrocodone around late morning or lunch, then other half of hydrocodone in evening.   Then you could to the second Gabapentin at bedtime if you needed more around the clock pain control  Otherwise just take the Hydrocodone once daily and the Gabapentin at breakfast and bedtime   Follow up with eye doctors as planned  Recheck with Dr .Sarah Howell in 1 month    Sarah Howell was seen today for headache.  Diagnoses and all orders for this visit:  Entropion, unspecified laterality  Eye pain, bilateral  Headache syndrome  Glaucoma, unspecified glaucoma type, unspecified laterality  Other orders -     gabapentin (NEURONTIN) 100 MG capsule; Take 1 capsule (100 mg total) by mouth 2 (two) times daily. -     HYDROcodone-acetaminophen (NORCO) 5-325 MG tablet; Take 1 tablet by mouth daily as needed.   Follow up: 33mo

## 2021-05-08 NOTE — Patient Instructions (Signed)
Recommendations: Begin Gabapentin neuropathic pain medication twice daily.  Caution as this can cause sedation. Begin Norco Hydrocodone pain medication.  This can be taken either once daily when the pain is worse.  OR this can be taken 1/2 tablet twice daily.     So for example you could take Gabapentin at breakfast, then 1/2 Hydrocodone around late morning or lunch, then other half of hydrocodone in evening.   Then you could to the second Gabapentin at bedtime if you needed more around the clock pain control  Otherwise just take the Hydrocodone once daily and the Gabapentin at breakfast and bedtime   Follow up with eye doctors as planned  Recheck with Dr .Redmond School in 1 month

## 2021-05-09 ENCOUNTER — Other Ambulatory Visit: Payer: Self-pay | Admitting: Medical

## 2021-05-09 ENCOUNTER — Telehealth: Payer: Self-pay | Admitting: Family Medicine

## 2021-05-09 MED ORDER — GABAPENTIN 100 MG PO CAPS: 100.0000 mg | ORAL_CAPSULE | Freq: Two times a day (BID) | ORAL | 1 refills | Status: DC

## 2021-05-09 NOTE — Telephone Encounter (Signed)
Pt called and said the [facility]ap[facility] is not in [facility]. She wants to see if the [facility] on [address] has it and see if it can be called in there

## 2021-05-14 ENCOUNTER — Other Ambulatory Visit: Payer: Self-pay

## 2021-05-14 ENCOUNTER — Telehealth: Payer: Self-pay | Admitting: Family Medicine

## 2021-05-14 DIAGNOSIS — H5713 Ocular pain, bilateral: Secondary | ICD-10-CM

## 2021-05-14 DIAGNOSIS — H409 Unspecified glaucoma: Secondary | ICD-10-CM

## 2021-05-14 DIAGNOSIS — G4489 Other headache syndrome: Secondary | ICD-10-CM

## 2021-05-14 NOTE — Telephone Encounter (Signed)
Patient states she is still in severe pain still in her headache and eye pain. She goes to see [facility] in august. I have scheduled a follow-up appt with Dr. Redmond School for next week. She is taking gabapentin everyday and then will take hydrocodone as needed

## 2021-05-14 NOTE — Telephone Encounter (Signed)
Referral was put in urgent. [place]

## 2021-05-14 NOTE — Telephone Encounter (Signed)
Pt called and states she had an appt with Audelia Acton last week. She continues to have pain. The medication given is not helping at all. She is schedule to see an eye doctor 08/18. She wanted to see JCL today but he is over booked. Plesae advise pt at [phone]. [facility] [address] Sending this to both [place] due to issue.

## 2021-05-15 ENCOUNTER — Other Ambulatory Visit: Payer: Self-pay

## 2021-05-15 ENCOUNTER — Telehealth: Payer: Self-pay

## 2021-05-15 ENCOUNTER — Ambulatory Visit
Admission: RE | Admit: 2021-05-15 | Discharge: 2021-05-15 | Disposition: A | Discharge status: Home / Self Care | Payer: Medicare PPO | Source: Ambulatory Visit | Attending: Family Medicine | Admitting: Family Medicine

## 2021-05-15 DIAGNOSIS — Z1231 Encounter for screening mammogram for malignant neoplasm of breast: Secondary | ICD-10-CM

## 2021-05-15 NOTE — Telephone Encounter (Signed)
Spoke to Dr. Redmond School and he advised to have pt double her Gabapentin and Hydrocodone to get the pt some relief. He also advise to call Dr. Zadie Rhine office to get pt seen sooner . Pt was schedule with Dr. Zadie Rhine 05/20/21 1pm. KH

## 2021-05-15 NOTE — Telephone Encounter (Signed)
Pt daughter called back to advise pt is not doing well on the medicine she was prescribed . It is not giving her any relief. Pt daughter was asking for increase of pain med to help. Her eye appt at [facility] is 06/13/21 and they have not heard from Dr. Zadie Rhine office yet. They were advised of the urgent referral that was put in. Please advise if you would be willing to increase pain med. [place]

## 2021-05-20 ENCOUNTER — Encounter (INDEPENDENT_AMBULATORY_CARE_PROVIDER_SITE_OTHER): Payer: Self-pay | Admitting: Ophthalmology

## 2021-05-20 ENCOUNTER — Encounter (INDEPENDENT_AMBULATORY_CARE_PROVIDER_SITE_OTHER): Payer: Medicare PPO | Admitting: Ophthalmology

## 2021-05-20 ENCOUNTER — Ambulatory Visit (INDEPENDENT_AMBULATORY_CARE_PROVIDER_SITE_OTHER): Payer: Medicare PPO | Admitting: Ophthalmology

## 2021-05-20 ENCOUNTER — Other Ambulatory Visit: Payer: Self-pay

## 2021-05-20 DIAGNOSIS — R519 Headache, unspecified: Secondary | ICD-10-CM | POA: Diagnosis not present

## 2021-05-20 DIAGNOSIS — H02013 Cicatricial entropion of right eye, unspecified eyelid: Secondary | ICD-10-CM | POA: Diagnosis not present

## 2021-05-20 DIAGNOSIS — H02043 Spastic entropion of right eye, unspecified eyelid: Secondary | ICD-10-CM | POA: Insufficient documentation

## 2021-05-20 DIAGNOSIS — H02016 Cicatricial entropion of left eye, unspecified eyelid: Secondary | ICD-10-CM | POA: Diagnosis not present

## 2021-05-20 DIAGNOSIS — H401130 Primary open-angle glaucoma, bilateral, stage unspecified: Secondary | ICD-10-CM | POA: Diagnosis not present

## 2021-05-20 DIAGNOSIS — L121 Cicatricial pemphigoid: Secondary | ICD-10-CM | POA: Diagnosis not present

## 2021-05-20 DIAGNOSIS — H02046 Spastic entropion of left eye, unspecified eyelid: Secondary | ICD-10-CM

## 2021-05-20 DIAGNOSIS — Z961 Presence of intraocular lens: Secondary | ICD-10-CM | POA: Diagnosis not present

## 2021-05-20 DIAGNOSIS — H16143 Punctate keratitis, bilateral: Secondary | ICD-10-CM | POA: Diagnosis not present

## 2021-05-20 DIAGNOSIS — H16223 Keratoconjunctivitis sicca, not specified as Sjogren's, bilateral: Secondary | ICD-10-CM | POA: Diagnosis not present

## 2021-05-20 MED ORDER — HYDROCODONE-ACETAMINOPHEN 7.5-325 MG PO TABS: 1.0000 | ORAL_TABLET | Freq: Four times a day (QID) | ORAL | 0 refills | Status: DC | PRN

## 2021-05-20 NOTE — Assessment & Plan Note (Signed)
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

## 2021-05-20 NOTE — Assessment & Plan Note (Signed)
Appears to be secondary to inferior lower lid entropion OU.

## 2021-05-20 NOTE — Assessment & Plan Note (Signed)
Severe, now with corneal changes OU

## 2021-05-20 NOTE — Progress Notes (Signed)
05/20/2021     CHIEF COMPLAINT Patient presents for Retina Follow Up (3 week - Dr. Redmond School did not want pt to wait for 10 week fu/Pt states, "I am in terrible pain across my head and behind my eyes. I am not able to see hardly at all. My vision is very blurry. This has all been going on for about 3 weeks. ."/Pt reports using Cosopt BID OU and also E-mycin ung/)   HISTORY OF PRESENT ILLNESS: Sarah Howell is a 80 y.o. female who presents to the clinic today for:   HPI     Retina Follow Up           Diagnosis: Other   Laterality: left eye   Onset: 3 weeks ago   Severity: mild   Duration: 3 weeks   Course: stable   Comments: 3 week - Dr. Redmond School did not want pt to wait for 10 week fu Pt states, "I am in terrible pain across my head and behind my eyes. I am not able to see hardly at all. My vision is very blurry. This has all been going on for about 3 weeks. ." Pt reports using Cosopt BID OU and also E-mycin ung        Last edited by Kendra Opitz, COA on 05/20/2021  1:09 PM.      Referring physician: Denita Lung, [address],  [place] [address]  HISTORICAL INFORMATION:   Selected notes from the MEDICAL RECORD NUMBER    Lab Results  Component Value Date   HGBA1C 5.7 (A) 01/16/2021     CURRENT MEDICATIONS: Current Outpatient Medications (Ophthalmic Drugs)  Medication Sig   cycloSPORINE (CEQUA OP) Apply 1 drop to eye 2 (two) times daily. (Patient not taking: Reported on 05/08/2021)   dorzolamide-timolol (COSOPT) 22.3-6.8 MG/ML ophthalmic solution Place 1 drop into both eyes 2 (two) times daily.   erythromycin ophthalmic ointment SMARTSIG:Sparingly In Eye(s) 3 Times Daily   Loteprednol Etabonate (LOTEMAX SM) 0.38 % GEL Apply to eye. (Patient not taking: No sig reported)   Propylene Glycol (SYSTANE COMPLETE OP) Apply to eye.   No current facility-administered medications for this visit. (Ophthalmic Drugs)   Current Outpatient Medications (Other)   Medication Sig   Calcium Carbonate-Vitamin D (CALCIUM + D PO) Take 1 tablet by mouth 2 (two) times daily. (Patient not taking: Reported on 05/08/2021)   finasteride (PROSCAR) 5 MG tablet Take 0.5 tablets (2.5 mg total) by mouth daily. Patient uses for hair growth.   fluticasone (FLONASE) 50 MCG/ACT nasal spray PLACE 2 SPRAYS INTO THE NOSE DAILY.   gabapentin (NEURONTIN) 100 MG capsule Take 1 capsule (100 mg total) by mouth 2 (two) times daily.   HYDROcodone-acetaminophen (NORCO) 7.5-325 MG tablet Take 1 tablet by mouth every 6 (six) hours as needed for moderate pain.   losartan-hydrochlorothiazide (HYZAAR) 100-12.5 MG tablet TAKE 1 TABLET BY MOUTH EVERY DAY   montelukast (SINGULAIR) 10 MG tablet Take 1 tablet (10 mg total) by mouth at bedtime.   pravastatin (PRAVACHOL) 80 MG tablet Take 1 tablet (80 mg total) by mouth daily.   No current facility-administered medications for this visit. (Other)      REVIEW OF SYSTEMS:    ALLERGIES Allergies  Allergen Reactions   Fish Allergy Nausea And Vomiting   Other Nausea And Vomiting    PAST MEDICAL HISTORY Past Medical History:  Diagnosis Date   Allergy    Alopecia    Breast cancer ([place]) 1989  left breast cancer; s/p mastectomy and reconstruction   Cancer ([place])    BREAST   Cough    Cystoid macular edema of both eyes 10/25/2020   Resolved since 2019 commensurate with institution of nightly use of CPAP to treat active MAC-TEL   Glaucoma    bilateral   Hyperlipidemia    Hypertension    dr Demetrios Isaacs    pcp   Osteopenia    Shortness of breath    WITH EXERTION    Past Surgical History:  Procedure Laterality Date   BREAST SURGERY     lft br mastectomy and reconstruction   CATARACT EXTRACTION W/PHACO  12/10/2011   Procedure: CATARACT EXTRACTION PHACO AND INTRAOCULAR LENS PLACEMENT ([place]);  Surgeon: Marylynn Pearson, MD;  Location: [facility];  Service: Ophthalmology;  Laterality: Right;   CATARACT EXTRACTION W/PHACO  02/04/2012   Procedure:  CATARACT EXTRACTION PHACO AND INTRAOCULAR LENS PLACEMENT ([facility]);  Surgeon: Marylynn Pearson, MD;  Location: [facility];  Service: Ophthalmology;  Laterality: Left;   EYE SURGERY Bilateral 2013   cataract surgery   lt breast ca reconstruction  07/28/1988   MASTECTOMY Left 1989   Orthoscopic surgery lt knee  2002   Orthoscopic surgery rt knee  08/05/2009   Rotoator cuff cleaning      FAMILY HISTORY Family History  Problem Relation Age of Onset   Colon cancer Mother        dx. 45s; "recurrence" at 84   Prostate cancer Father        dx. 68s   Pancreatic cancer Brother        dx. 81-82   Breast cancer Sister 30   Pancreatic cancer Sister    Parkinson's disease Brother    Prostate cancer Son 40   Cancer Other        unspecified type   Diabetes Paternal Aunt    Diabetes Paternal Uncle    Breast cancer Cousin        dx. <70s   Breast cancer Cousin        dx. >50    SOCIAL HISTORY Social History   Tobacco Use   Smoking status: Never   Smokeless tobacco: Never  Substance Use Topics   Alcohol use: No    Alcohol/week: 0.0 standard drinks   Drug use: No         OPHTHALMIC EXAM:  Base Eye Exam     Visual Acuity (ETDRS)       Right Left   Dist [place] HM HM         Neuro/Psych     Oriented x3: Yes   Mood/Affect: Normal           Slit Lamp and Fundus Exam     External Exam       Right Left   External Normal Normal         Slit Lamp Exam       Right Left   Lids/Lashes Entropion, spastic inferior lid, with trichiasis Entropion, spastic inferior lid, with trichiasis   Conjunctiva/Sclera [place] and quiet [place] and quiet   Cornea epi irregularities, no defects, no infiltrates, early stromal edema,  anterior stromal haze, epi Punctate keratopathy   Anterior Chamber Deep and quiet Deep and quiet   Iris Round and reactive Round and reactive   Lens Posterior chamber intraocular lens Posterior chamber intraocular lens   Anterior Vitreous Normal Normal          Fundus Exam       Right  Left   Posterior Vitreous Normal Normal   Disc Normal Normal   C/D Ratio 0.65 0.75   Macula Drusen, Hard drusen Drusen, Hard drusen   Vessels Normal Normal   Periphery Normal Normal            IMAGING AND PROCEDURES  Imaging and Procedures for 05/20/21           ASSESSMENT/PLAN:  Spastic entropion, right Severe, now with corneal changes OU  Punctate keratitis of both eyes Appears to be secondary to inferior lower lid entropion OU.  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 Spastic entropion, right  H02.043     2. Punctate keratitis of both eyes  H16.143     3. Entropion, spastic, left  H02.046       1.  He reports having appointment at [facility] coming June 13, 2021  2.  Refer for corneal evaluation and consideration of intermediate temporizing therapy until that time, contact Dr. Wyatt Portela to see today  3.  Ophthalmic Meds Ordered this visit:  No orders of the defined types were placed in this encounter.      Return for TO DR SCOTT GROATS OFFICE NOW.  There are no Patient Instructions on file for this visit.   Explained the diagnoses, plan, and follow up with the patient and they expressed understanding.  Patient expressed understanding of the importance of proper follow up care.   Clent Demark Chelsa Stout M.D. Diseases & Surgery of the Retina and Vitreous [facility] [place] 05/20/21     Abbreviations: M myopia (nearsighted); A astigmatism; H hyperopia (farsighted); P presbyopia; Mrx spectacle prescription;  CTL contact lenses; OD right eye; OS left eye; OU both eyes  XT exotropia; ET esotropia; PEK punctate epithelial keratitis; PEE punctate epithelial erosions; DES dry eye syndrome; MGD meibomian gland dysfunction; ATs artificial tears; PFAT's preservative free artificial tears; [place] nuclear sclerotic cataract; PSC  posterior subcapsular cataract; ERM epi-retinal membrane; PVD posterior vitreous detachment; RD retinal detachment; DM diabetes mellitus; DR diabetic retinopathy; NPDR non-proliferative diabetic retinopathy; PDR proliferative diabetic retinopathy; CSME clinically significant macular edema; DME diabetic macular edema; dbh dot blot hemorrhages; CWS cotton wool spot; POAG primary open angle glaucoma; C/D cup-to-disc ratio; HVF humphrey visual field; GVF goldmann visual field; OCT optical coherence tomography; IOP intraocular pressure; BRVO Branch retinal vein occlusion; CRVO central retinal vein occlusion; CRAO central retinal artery occlusion; BRAO branch retinal artery occlusion; RT retinal tear; SB scleral buckle; PPV pars plana vitrectomy; VH Vitreous hemorrhage; PRP panretinal laser photocoagulation; IVK intravitreal kenalog; VMT vitreomacular traction; MH Macular hole;  NVD neovascularization of the disc; NVE neovascularization elsewhere; AREDS age related eye disease study; ARMD age related macular degeneration; POAG primary open angle glaucoma; EBMD epithelial/anterior basement membrane dystrophy; ACIOL anterior chamber intraocular lens; IOL intraocular lens; PCIOL posterior chamber intraocular lens; Phaco/IOL phacoemulsification with intraocular lens placement; [facility] photorefractive keratectomy; LASIK laser assisted in situ keratomileusis; HTN hypertension; DM diabetes mellitus; COPD chronic obstructive pulmonary disease

## 2021-05-20 NOTE — Telephone Encounter (Signed)
Pt called and states she needs refill on hydrocodone due to pain. She states that she is taking all meds given for this issue and is not controlling the pain. She has to take tylenol and advil too. [facility] [address]

## 2021-05-21 ENCOUNTER — Ambulatory Visit: Payer: Medicare PPO | Admitting: Family Medicine

## 2021-05-21 ENCOUNTER — Telehealth: Payer: Self-pay | Admitting: Family Medicine

## 2021-05-21 DIAGNOSIS — R930 Abnormal findings on diagnostic imaging of skull and head, not elsewhere classified: Secondary | ICD-10-CM | POA: Diagnosis not present

## 2021-05-21 DIAGNOSIS — G44201 Tension-type headache, unspecified, intractable: Secondary | ICD-10-CM | POA: Diagnosis not present

## 2021-05-21 DIAGNOSIS — G4733 Obstructive sleep apnea (adult) (pediatric): Secondary | ICD-10-CM | POA: Diagnosis not present

## 2021-05-21 DIAGNOSIS — H02012 Cicatricial entropion of right lower eyelid: Secondary | ICD-10-CM | POA: Diagnosis not present

## 2021-05-21 DIAGNOSIS — Z853 Personal history of malignant neoplasm of breast: Secondary | ICD-10-CM | POA: Diagnosis not present

## 2021-05-21 DIAGNOSIS — H02015 Cicatricial entropion of left lower eyelid: Secondary | ICD-10-CM | POA: Diagnosis not present

## 2021-05-21 DIAGNOSIS — R519 Headache, unspecified: Secondary | ICD-10-CM | POA: Diagnosis not present

## 2021-05-21 DIAGNOSIS — H1089 Other conjunctivitis: Secondary | ICD-10-CM | POA: Diagnosis not present

## 2021-05-21 DIAGNOSIS — H179 Unspecified corneal scar and opacity: Secondary | ICD-10-CM | POA: Diagnosis not present

## 2021-05-21 DIAGNOSIS — Z111 Encounter for screening for respiratory tuberculosis: Secondary | ICD-10-CM | POA: Diagnosis not present

## 2021-05-21 NOTE — Telephone Encounter (Signed)
Received a call from [facility] eye care. Doctor will try to get notes to [place] today. However, they wanted to let [facility] know that it will not have a lot in it. They have referred pt to [facility] and pt should be there today. Due to time she may not be here today. I tried to verify with pt and did not get an answer.

## 2021-05-22 ENCOUNTER — Telehealth: Payer: Self-pay | Admitting: Family Medicine

## 2021-05-22 NOTE — Telephone Encounter (Signed)
Pt was advise and appointment was made. Kh

## 2021-05-22 NOTE — Telephone Encounter (Signed)
Pt called and states that the doctor she saw at [facility] yesterday prescribed a medication that doctor stated could interfere with her BP Meds. She was unsure of med so I called and requested records from [facility] eye center. According to those notes pt was prescribed prednisone 10 mg. Please advise pt if will be an issue with BP meds. Pt also states that she had a MRI late last night but does not know results and they were not in records I received. Sending records back in folder. Pt was advise JCL out of office today.

## 2021-05-24 ENCOUNTER — Telehealth: Payer: Self-pay | Admitting: Family Medicine

## 2021-05-24 DIAGNOSIS — H5713 Ocular pain, bilateral: Secondary | ICD-10-CM

## 2021-05-24 MED ORDER — HYDROCODONE-ACETAMINOPHEN 7.5-325 MG PO TABS: 1.0000 | ORAL_TABLET | Freq: Four times a day (QID) | ORAL | 0 refills | Status: DC | PRN

## 2021-05-24 NOTE — Telephone Encounter (Signed)
Pt called requesting refill on hydrocodone

## 2021-05-28 ENCOUNTER — Telehealth: Payer: Self-pay | Admitting: Family Medicine

## 2021-05-28 ENCOUNTER — Other Ambulatory Visit: Payer: Self-pay

## 2021-05-28 ENCOUNTER — Encounter: Payer: Self-pay | Admitting: Family Medicine

## 2021-05-28 ENCOUNTER — Ambulatory Visit: Payer: Medicare PPO | Admitting: Family Medicine

## 2021-05-28 VITALS — BP 158/92 | HR 96 | Temp 97.0°F | Wt 199.0 lb

## 2021-05-28 DIAGNOSIS — I10 Essential (primary) hypertension: Secondary | ICD-10-CM | POA: Diagnosis not present

## 2021-05-28 DIAGNOSIS — H1033 Unspecified acute conjunctivitis, bilateral: Secondary | ICD-10-CM

## 2021-05-28 NOTE — Telephone Encounter (Signed)
PT would like  a parking placard put in your folder, she asked for it when she left, states she forgot to ask for it when she was in the appt Can be reached at [phone] when ready

## 2021-05-28 NOTE — Progress Notes (Signed)
   Subjective:    Patient ID: Sarah Howell, female    DOB: [birth year], 80 y.o.   MRN: [id number]  HPI She is here for recheck.  She has had a great deal difficulty dealing with eye problems.  She has seen several ophthalmologist here in town and at [place].  She apparently has an unusual case of conjunctivitis.  Presently she is on steroids taking 60 mg daily.  She was evaluated due to because of her headaches and the MRI was negative except for bilateral maxillary sinus fluid levels.  She does state that her headache is doing much better.  She has no upper tooth discomfort, PND, facial pain, fever or chills.  She has a scheduled follow-up appointment at [facility].   Review of Systems     Objective:   Physical Exam Alert and in no distress.  Both conjunctive are still injected with eyelid edema.  No tenderness of palpation over her frontal or maxillary sinuses.  Neck is supple without adenopathy. The MRI from [facility] was reviewed with her.  Blood work from [facility] was also reviewed.      Assessment & Plan:   Acute conjunctivitis of both eyes, unspecified acute conjunctivitis type  Primary hypertension She will continue on the steroids as directed by ophthalmology.  I explained that the blood pressure can be up because of the steroids and we will not be concerned about it at the present time.  She was comfortable with that.  Recommend follow-up in several months.

## 2021-05-29 DIAGNOSIS — G44201 Tension-type headache, unspecified, intractable: Secondary | ICD-10-CM | POA: Diagnosis not present

## 2021-05-29 DIAGNOSIS — H1089 Other conjunctivitis: Secondary | ICD-10-CM | POA: Diagnosis not present

## 2021-05-29 DIAGNOSIS — Z1159 Encounter for screening for other viral diseases: Secondary | ICD-10-CM | POA: Diagnosis not present

## 2021-05-29 DIAGNOSIS — Z79899 Other long term (current) drug therapy: Secondary | ICD-10-CM | POA: Diagnosis not present

## 2021-05-29 DIAGNOSIS — Z7952 Long term (current) use of systemic steroids: Secondary | ICD-10-CM | POA: Diagnosis not present

## 2021-05-29 DIAGNOSIS — Z853 Personal history of malignant neoplasm of breast: Secondary | ICD-10-CM | POA: Diagnosis not present

## 2021-05-29 DIAGNOSIS — H02015 Cicatricial entropion of left lower eyelid: Secondary | ICD-10-CM | POA: Diagnosis not present

## 2021-05-29 DIAGNOSIS — H02012 Cicatricial entropion of right lower eyelid: Secondary | ICD-10-CM | POA: Diagnosis not present

## 2021-05-29 NOTE — Telephone Encounter (Signed)
LVM advising pt that Dr. Redmond School does not have medical reasoning to sign form. [place]

## 2021-06-03 ENCOUNTER — Telehealth: Payer: Self-pay | Admitting: Family Medicine

## 2021-06-03 NOTE — Telephone Encounter (Signed)
Pt called and said her Dr at [facility] wants to keep her on Prednisone and she wants to know does she need to take her BP meds also.

## 2021-06-07 DIAGNOSIS — H02015 Cicatricial entropion of left lower eyelid: Secondary | ICD-10-CM | POA: Diagnosis not present

## 2021-06-07 DIAGNOSIS — H1089 Other conjunctivitis: Secondary | ICD-10-CM | POA: Diagnosis not present

## 2021-06-07 DIAGNOSIS — R768 Other specified abnormal immunological findings in serum: Secondary | ICD-10-CM | POA: Diagnosis not present

## 2021-06-07 DIAGNOSIS — Z79899 Other long term (current) drug therapy: Secondary | ICD-10-CM | POA: Diagnosis not present

## 2021-06-07 DIAGNOSIS — H02012 Cicatricial entropion of right lower eyelid: Secondary | ICD-10-CM | POA: Diagnosis not present

## 2021-06-10 ENCOUNTER — Ambulatory Visit: Payer: Medicare PPO | Admitting: Family Medicine

## 2021-06-17 IMAGING — MG DIGITAL SCREENING BILAT W/ TOMO W/ CAD
6 of 10 series · 6 of 30 positions shown · non-contrast
Comparison: Previous exam(s).

CLINICAL DATA: Screening. History of left breast cancer 5686 post
mastectomy and reconstruction.

EXAM:
DIGITAL SCREENING BILATERAL MAMMOGRAM WITH TOMO AND CAD

[R CC synth-2D]
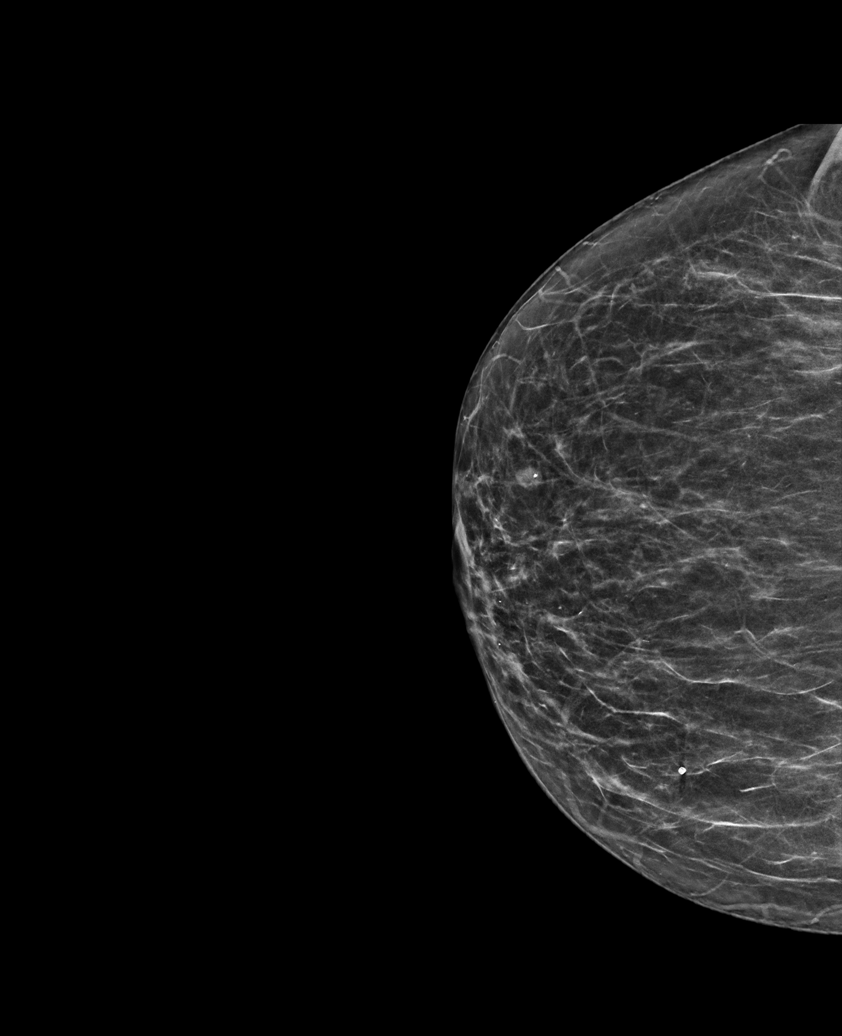

[R MLO synth-2D]
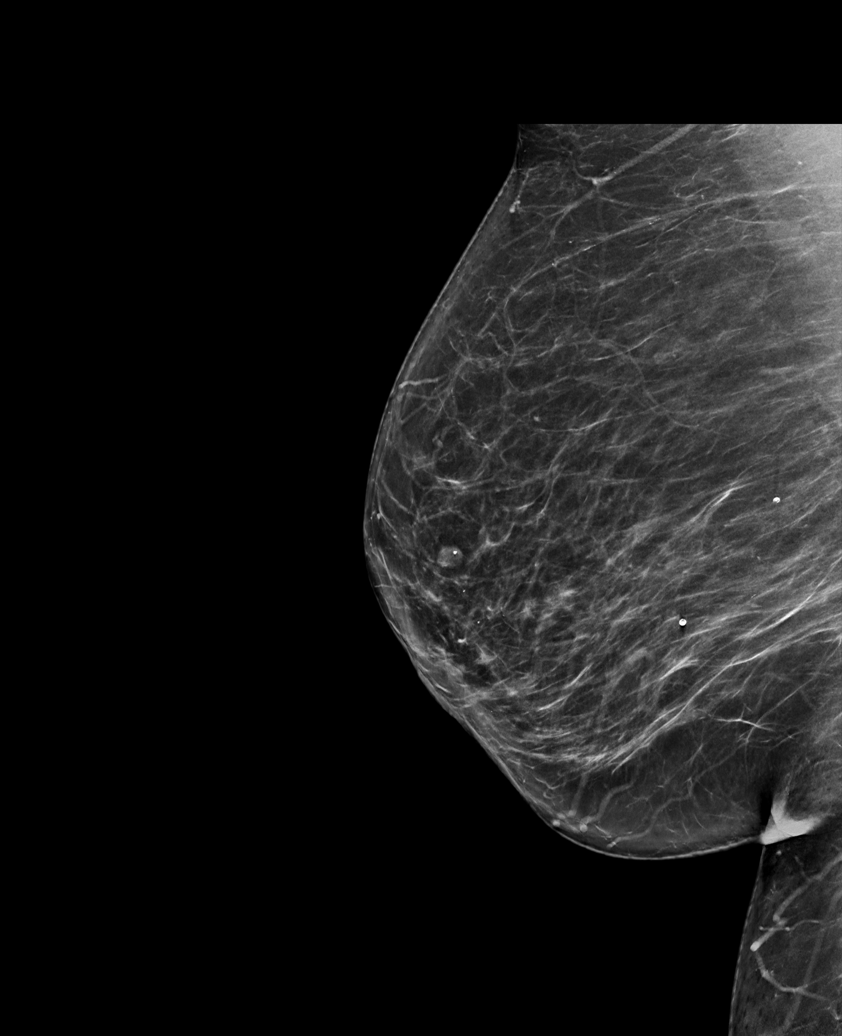

[L MLO synth-2D]
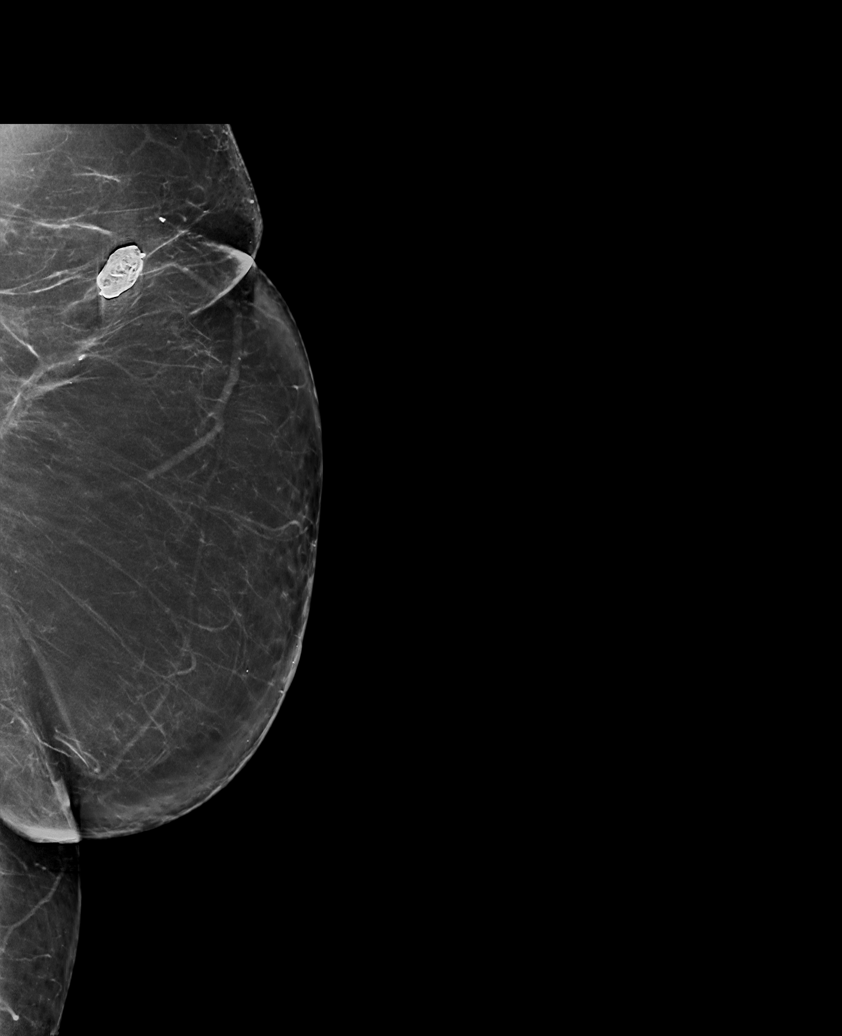

[L CC synth-2D (1 of 2)]
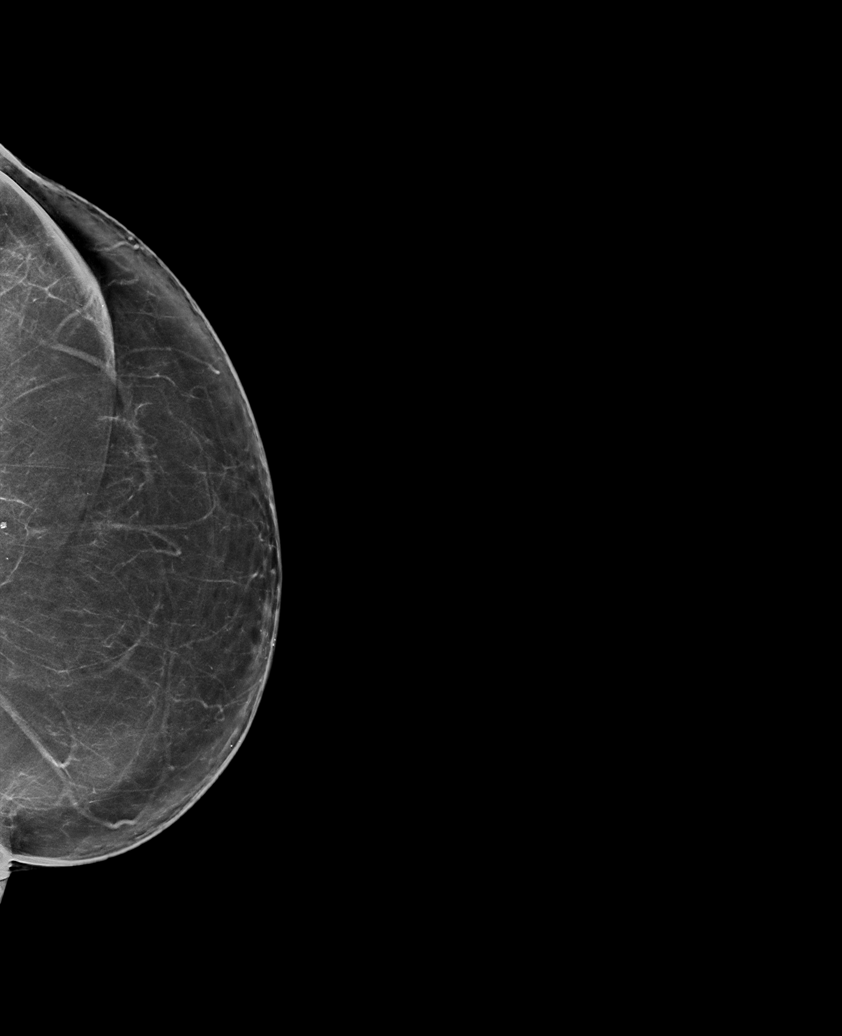

[L CC synth-2D (2 of 2)]
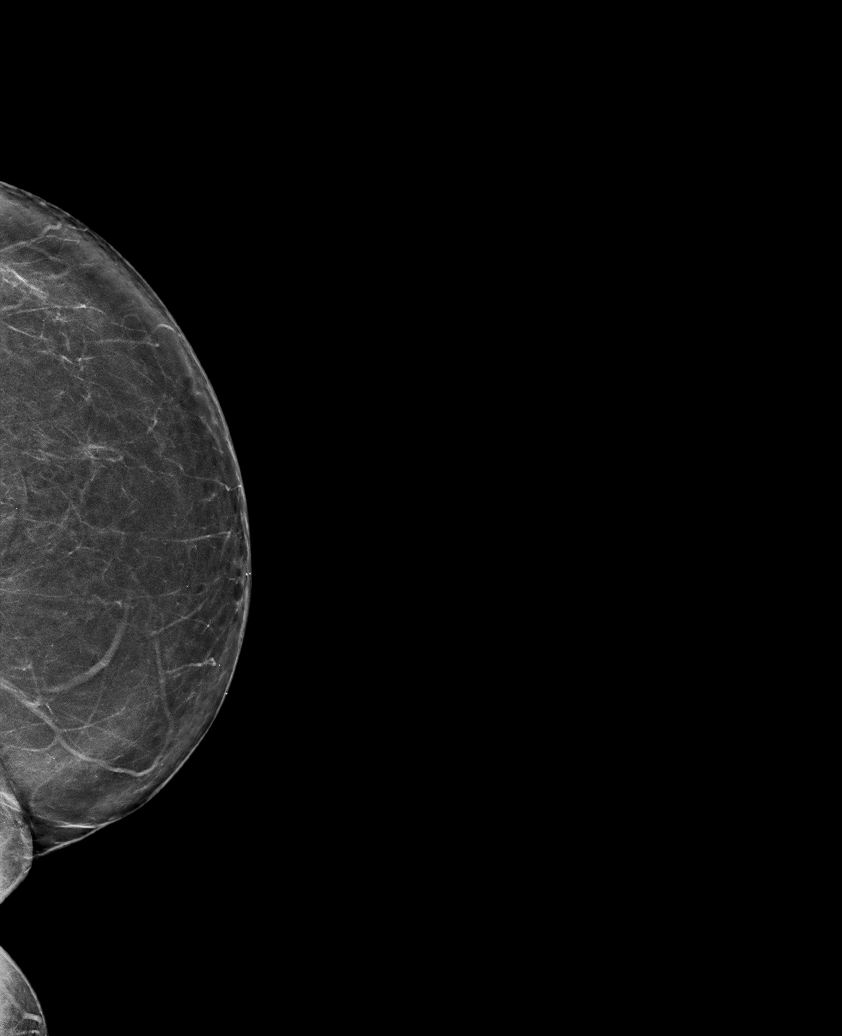

[L MLO tomo · tomo slice 42/83.0]
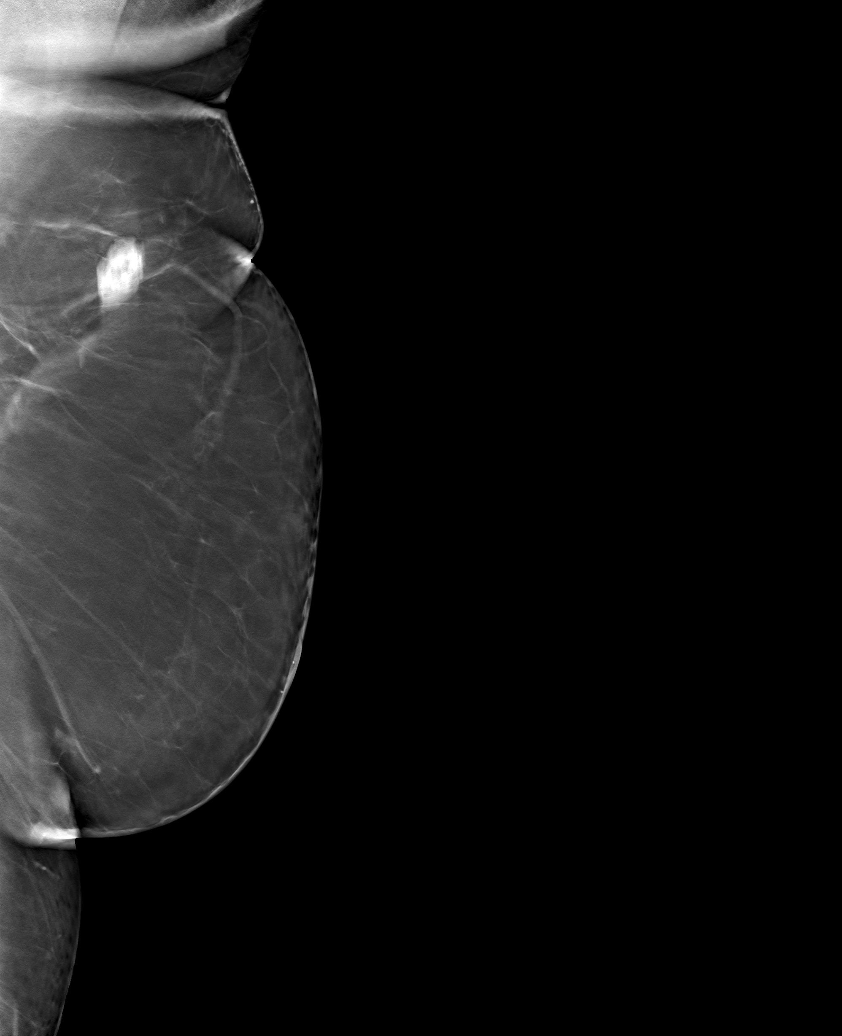

[6 of 30 positions shown; findings below may reference images not displayed]

ACR Breast Density Category b: There are scattered areas of
fibroglandular density.
FINDINGS: There are no findings suspicious for malignancy. Images were
processed with CAD.
IMPRESSION: No mammographic evidence of malignancy. A result letter of this
screening mammogram will be mailed directly to the patient.

RECOMMENDATION:
Screening mammogram in one year. (Code:H6-Y-MIO)

BI-RADS CATEGORY  1: Negative.

## 2021-06-19 DIAGNOSIS — J310 Chronic rhinitis: Secondary | ICD-10-CM | POA: Diagnosis not present

## 2021-06-19 DIAGNOSIS — J324 Chronic pansinusitis: Secondary | ICD-10-CM | POA: Diagnosis not present

## 2021-07-04 ENCOUNTER — Encounter (INDEPENDENT_AMBULATORY_CARE_PROVIDER_SITE_OTHER): Payer: Medicare PPO | Admitting: Ophthalmology

## 2021-07-04 ENCOUNTER — Telehealth: Payer: Self-pay | Admitting: Family Medicine

## 2021-07-04 ENCOUNTER — Other Ambulatory Visit: Payer: Medicare PPO

## 2021-07-04 NOTE — Telephone Encounter (Signed)
Pt wants to know if it is ok for her to get her flu shot since she is on 2 eye meds Prednisone or Mycobehenolate 500 mg

## 2021-07-09 ENCOUNTER — Other Ambulatory Visit (INDEPENDENT_AMBULATORY_CARE_PROVIDER_SITE_OTHER): Payer: Medicare PPO

## 2021-07-09 ENCOUNTER — Other Ambulatory Visit: Payer: Self-pay

## 2021-07-09 DIAGNOSIS — Z23 Encounter for immunization: Secondary | ICD-10-CM

## 2021-07-15 DIAGNOSIS — H401130 Primary open-angle glaucoma, bilateral, stage unspecified: Secondary | ICD-10-CM | POA: Diagnosis not present

## 2021-07-15 DIAGNOSIS — Z961 Presence of intraocular lens: Secondary | ICD-10-CM | POA: Diagnosis not present

## 2021-07-15 DIAGNOSIS — L121 Cicatricial pemphigoid: Secondary | ICD-10-CM | POA: Diagnosis not present

## 2021-07-17 DIAGNOSIS — H02015 Cicatricial entropion of left lower eyelid: Secondary | ICD-10-CM | POA: Diagnosis not present

## 2021-07-17 DIAGNOSIS — H1089 Other conjunctivitis: Secondary | ICD-10-CM | POA: Diagnosis not present

## 2021-07-22 ENCOUNTER — Other Ambulatory Visit: Payer: Self-pay

## 2021-07-22 ENCOUNTER — Ambulatory Visit: Payer: Medicare PPO | Admitting: Family Medicine

## 2021-07-22 VITALS — BP 154/86 | HR 103 | Temp 96.7°F | Wt 210.2 lb

## 2021-07-22 DIAGNOSIS — M8589 Other specified disorders of bone density and structure, multiple sites: Secondary | ICD-10-CM | POA: Diagnosis not present

## 2021-07-22 DIAGNOSIS — Z23 Encounter for immunization: Secondary | ICD-10-CM

## 2021-07-22 DIAGNOSIS — L659 Nonscarring hair loss, unspecified: Secondary | ICD-10-CM | POA: Diagnosis not present

## 2021-07-22 DIAGNOSIS — I10 Essential (primary) hypertension: Secondary | ICD-10-CM | POA: Diagnosis not present

## 2021-07-22 DIAGNOSIS — J309 Allergic rhinitis, unspecified: Secondary | ICD-10-CM | POA: Diagnosis not present

## 2021-07-22 NOTE — Progress Notes (Signed)
   Subjective:    Patient ID: Sarah Howell, female    DOB: 31-May-1941, 80 y.o.   MRN: [id number]  HPI She is here for an interval evaluation.  She has had a lot of difficulty with conjunctivitis and has been going to do.  Have had her on prednisone 100 mg for an extended period of time.  She is in the process of tapering off the prednisone and is now down to 40 mg. She notes that her allergies have not been giving her any difficulty recently.  She continues on losartan/HCTZ as well as pravastatin.  Her dermatologist is giving her the Proscar.  She has no concerns or complaints.  Review of Systems     Objective:   Physical Exam Alert and in no distress.  Exam of the eyes shows normal conjunctiva.  Cardiac exam shows regular rhythm without murmurs or gallops. DEXA scan was reviewed.      Assessment & Plan:  Primary hypertension  Allergic rhinitis, mild  Alopecia  Osteopenia of multiple sites  Immunization, viral disease - Plan: Pension scheme manager She will continue to taper off the prednisone.  I explained that that can have an effect on fluid retention as well as blood pressure and her allergies.  I will recheck this in several months.

## 2021-07-22 NOTE — Progress Notes (Deleted)
Sarah Howell is a 80 y.o. female who presents for annual wellness visit and follow-up on chronic medical conditions.  She has the following concerns:  Immunizations and Health Maintenance Immunization History  Administered Date(s) Administered   Fluad Quad(high Dose 65+) 07/08/2019, 07/03/2020, 07/09/2021   Influenza Split 09/09/2012   Influenza Whole 10/03/2009   Influenza, High Dose Seasonal PF 07/19/2014, 07/24/2015, 08/25/2016, 07/30/2017, 07/15/2018   Influenza,inj,Quad PF,6+ Mos 07/21/2013   PFIZER Comirnaty(Gray Top)Covid-19 Tri-Sucrose Vaccine 02/18/2021   PFIZER(Purple Top)SARS-COV-2 Vaccination 11/11/2019, 11/30/2019, 07/03/2020   Pneumococcal Conjugate-13 06/04/2017   Pneumococcal Polysaccharide-23 10/03/2009, 07/19/2014   Tdap 06/11/2016   Zoster Recombinat (Shingrix) 06/26/2017, 11/13/2017   Zoster, Live 10/28/2012   Health Maintenance Due  Topic Date Due   COVID-19 Vaccine (5 - Booster for Pfizer series) 06/20/2021    Last Pap smear: Last mammogram: Last colonoscopy: Last DEXA: Dentist: Ophtho: Exercise:  Other doctors caring for patient include:  Advanced directives:    Depression screen:  See questionnaire below.  Depression screen [facility] 2/9 01/16/2021 10/20/2019 06/17/2018 06/08/2018 06/04/2017  Decreased Interest 0 0 0 0 0  Down, Depressed, Hopeless 0 0 0 0 0  PHQ - 2 Score 0 0 0 0 0    Fall Risk Screen: see questionnaire below. Fall Risk  01/16/2021 10/20/2019 06/17/2018 06/08/2018 06/04/2017  Falls in the past year? 0 0 No No No  Number falls in past yr: 0 0 - - -  Injury with Fall? 0 0 - - -  Risk for fall due to : No Fall Risks - - - -  Follow up Falls evaluation completed - - - -    ADL screen:  See questionnaire below Functional Status Survey:     Review of Systems Constitutional: -, -unexpected weight change, -anorexia, -fatigue Allergy: -sneezing, -itching, -congestion Dermatology: denies changing moles, rash, lumps ENT: -runny nose, -ear  pain, -sore throat,  Cardiology:  -chest pain, -palpitations, -orthopnea, Respiratory: -cough, -shortness of breath, -dyspnea on exertion, -wheezing,  Gastroenterology: -abdominal pain, -nausea, -vomiting, -diarrhea, -constipation, -dysphagia Hematology: -bleeding or bruising problems Musculoskeletal: -arthralgias, -myalgias, -joint swelling, -back pain, - Ophthalmology: -vision changes,  Urology: -dysuria, -difficulty urinating,  -urinary frequency, -urgency, incontinence Neurology: -, -numbness, , -memory loss, -falls, -dizziness    PHYSICAL EXAM:  BP (!) 154/86   Pulse (!) 103   Temp (!) 96.7 F (35.9 C)   Wt 210 lb 3.2 oz (95.3 kg)   SpO2 96%   BMI 42.46 kg/m   General Appearance: Alert, cooperative, no distress, appears stated age Head: Normocephalic, without obvious abnormality, atraumatic Eyes: PERRL, conjunctiva/corneas clear, EOM's intact, fundi benign Ears: Normal TM's and external ear canals Nose: Nares normal, mucosa normal, no drainage or sinus tenderness Throat: Lips, mucosa, and tongue normal; teeth and gums normal Neck: Supple, no lymphadenopathy;  thyroid:  no enlargement/tenderness/nodules; no carotid bruit or JVD Lungs: Clear to auscultation bilaterally without wheezes, rales or ronchi; respirations unlabored Heart: Regular rate and rhythm, S1 and S2 normal, no murmur, rubor gallop Abdomen: Soft, non-tender, nondistended, normoactive bowel sounds,  no masses, no hepatosplenomegaly Extremities: No clubbing, cyanosis or edema Pulses: 2+ and symmetric all extremities Skin:  Skin color, texture, turgor normal, no rashes or lesions Lymph nodes: Cervical, supraclavicular, and axillary nodes normal Neurologic:  CNII-XII intact, normal strength, sensation and gait; reflexes 2+ and symmetric throughout Psych: Normal mood, affect, hygiene and grooming.  ASSESSMENT/PLAN:    Discussed monthly self breast exams and yearly mammograms; at least 30 minutes of aerobic  activity at  least 5 days/week and weight-bearing exercise 2x/week; proper sunscreen use reviewed; healthy diet, including goals of calcium and vitamin D intake and alcohol recommendations (less than or equal to 1 drink/day) reviewed; regular seatbelt use; changing batteries in smoke detectors.  Immunization recommendations discussed.  Colonoscopy recommendations reviewed   Medicare Attestation I have personally reviewed: The patient's medical and social history Their use of alcohol, tobacco or illicit drugs Their current medications and supplements The patient's functional ability including ADLs,fall risks, home safety risks, cognitive, and hearing and visual impairment Diet and physical activities Evidence for depression or mood disorders  The patient's weight, height, and BMI have been recorded in the chart.  I have made referrals, counseling, and provided education to the patient based on review of the above and I have provided the patient with a written personalized care plan for preventive services.     Jill Alexanders, MD   07/22/2021

## 2021-08-19 ENCOUNTER — Other Ambulatory Visit: Payer: Self-pay

## 2021-08-19 ENCOUNTER — Ambulatory Visit: Payer: Medicare PPO | Admitting: Family Medicine

## 2021-08-19 VITALS — Wt 210.0 lb

## 2021-08-19 DIAGNOSIS — R109 Unspecified abdominal pain: Secondary | ICD-10-CM | POA: Diagnosis not present

## 2021-08-19 NOTE — Progress Notes (Signed)
   Subjective:    Patient ID: Sarah Howell, female    DOB: 10/04/1941, 80 y.o.   MRN: [id number]  HPI Documentation for virtual audio and video telecommunications through [employer] encounter: The patient was located at home. 2 patient identifiers used.  The provider was located in the office. The patient did consent to this visit and is aware of possible charges through their insurance for this visit. The other persons participating in this telemedicine service were none. Time spent on call was 4 minutes and in review of previous records >12 minutes total for counseling and coordination of care. This virtual service is not related to other E/M service within previous 7 days.  She complains of a 3-day history of difficulty with abdominal pain.  She states that when she eats within an hour or so she has some discomfort.  She is missing a lot of her teeth and is therefore on a very bland soft diet.  No nausea, vomiting, diarrhea.  She cannot relate this to greasy foods or milk products.   Review of Systems     Objective:   Physical Exam Alert and in no distress otherwise not examined       Assessment & Plan:  Abdominal pain, unspecified abdominal location Will start initially with Maalox liquid several times per day to see if this will work.  If it does not she will come in for an examination.

## 2021-08-26 ENCOUNTER — Ambulatory Visit: Payer: Medicare PPO | Admitting: Family Medicine

## 2021-08-26 ENCOUNTER — Other Ambulatory Visit: Payer: Self-pay

## 2021-08-26 ENCOUNTER — Other Ambulatory Visit (INDEPENDENT_AMBULATORY_CARE_PROVIDER_SITE_OTHER): Payer: Medicare PPO

## 2021-08-26 ENCOUNTER — Encounter: Payer: Self-pay | Admitting: Family Medicine

## 2021-08-26 VITALS — BP 138/84 | HR 104 | Temp 97.6°F | Wt 209.6 lb

## 2021-08-26 DIAGNOSIS — R110 Nausea: Secondary | ICD-10-CM

## 2021-08-26 DIAGNOSIS — R059 Cough, unspecified: Secondary | ICD-10-CM

## 2021-08-26 DIAGNOSIS — R600 Localized edema: Secondary | ICD-10-CM

## 2021-08-26 LAB — POC COVID19 BINAXNOW: SARS Coronavirus 2 Ag: NEGATIVE

## 2021-08-26 NOTE — Patient Instructions (Signed)
Try  2 Prilosec daily for 2 weeks

## 2021-08-26 NOTE — Progress Notes (Signed)
   Subjective:    Patient ID: Sarah Howell, female    DOB: 08/27/1941, 80 y.o.   MRN: [id number]  HPI She complains of a 2-week history of intermittent coughing.  She also has difficulty with nausea but apparently predated this.  No fever, chills, sore throat, earache.  She does elevate the head of the bed has been doing that for comfort for a long period of time.  She is also tried Mylanta twice per day to help with the nausea and does state that she does feel slightly better.  She finished a course of steroids in mid October.  She has had some difficulty with pedal edema but no chest pain, shortness of breath or PND.   Review of Systems     Objective:   Physical Exam Alert and in no distress. Tympanic membranes and canals are normal. Pharyngeal area is normal. Neck is supple without adenopathy or thyromegaly. Cardiac exam shows a regular sinus rhythm without murmurs or gallops. Lungs are clear to auscultation. Bilateral pedal edema is noted.       Assessment & Plan:  Cough, unspecified type  Nausea  Pedal edema Recommend she try 40 mg of Prilosec daily for the next several weeks.  Explained that I would hope that this will help both with the cough and with the nausea.  If not then we will pursue this further. I explained that if the swelling is related to the prednisone this should slowly clear up.  If not again this will need to be further evaluated.  She was comfortable with all this.

## 2021-08-29 DIAGNOSIS — H02015 Cicatricial entropion of left lower eyelid: Secondary | ICD-10-CM | POA: Diagnosis not present

## 2021-08-29 DIAGNOSIS — H1089 Other conjunctivitis: Secondary | ICD-10-CM | POA: Diagnosis not present

## 2021-08-29 DIAGNOSIS — H02012 Cicatricial entropion of right lower eyelid: Secondary | ICD-10-CM | POA: Diagnosis not present

## 2021-08-29 DIAGNOSIS — Z79899 Other long term (current) drug therapy: Secondary | ICD-10-CM | POA: Diagnosis not present

## 2021-09-11 ENCOUNTER — Ambulatory Visit: Payer: Medicare PPO | Admitting: Family Medicine

## 2021-09-11 ENCOUNTER — Encounter: Payer: Self-pay | Admitting: Family Medicine

## 2021-09-11 ENCOUNTER — Other Ambulatory Visit: Payer: Self-pay

## 2021-09-11 VITALS — BP 126/82 | HR 104 | Temp 96.9°F | Wt 204.4 lb

## 2021-09-11 DIAGNOSIS — R1011 Right upper quadrant pain: Secondary | ICD-10-CM | POA: Diagnosis not present

## 2021-09-11 NOTE — Progress Notes (Signed)
   Subjective:    Patient ID: Sarah Howell, female    DOB: August 24, 1941, 80 y.o.   MRN: [id number]  HPI She is here for a recheck.  She has been having difficulty for the last month with intermittent abdominal pain.  She now localizes the area to the right upper quadrant.  It is intermittent in nature.  Is now associated with bloating especially half an hour after she eats but cannot specify whether greasy foods play a role in this.  She has had no vomiting or diarrhea.   Review of Systems     Objective:   Physical Exam Alert and in no distress.  Abdominal exam does show a positive Murphy sign and Murphy's punch.  Bowel sounds are normal.  No rebound is noted.       Assessment & Plan:  Right upper quadrant pain - Plan: US Abdomen Limited RUQ (LIVER/GB) Explained that we will do an ultrasound and may possibly need to do more evaluation if this is not positive.  Follow-up pending result of the ultrasound.

## 2021-09-17 ENCOUNTER — Ambulatory Visit
Admission: RE | Admit: 2021-09-17 | Discharge: 2021-09-17 | Disposition: A | Discharge status: Home / Self Care | Payer: Medicare PPO | Source: Ambulatory Visit | Attending: Family Medicine | Admitting: Family Medicine

## 2021-09-17 ENCOUNTER — Telehealth: Payer: Self-pay | Admitting: Internal Medicine

## 2021-09-17 DIAGNOSIS — K7689 Other specified diseases of liver: Secondary | ICD-10-CM | POA: Diagnosis not present

## 2021-09-17 DIAGNOSIS — R1011 Right upper quadrant pain: Secondary | ICD-10-CM | POA: Diagnosis not present

## 2021-09-17 NOTE — Telephone Encounter (Signed)
Left message for pt to call me back 

## 2021-09-17 NOTE — Telephone Encounter (Signed)
Pt informed of Dr. Lanice Shirts instructions

## 2021-09-17 NOTE — Telephone Encounter (Signed)
Pt called stating she is still having stomach pain and would like to be recommended something to help with pain. [facility] [place]

## 2021-09-18 ENCOUNTER — Other Ambulatory Visit: Payer: Self-pay

## 2021-09-18 DIAGNOSIS — R1011 Right upper quadrant pain: Secondary | ICD-10-CM

## 2021-09-30 ENCOUNTER — Telehealth: Payer: Self-pay | Admitting: Family Medicine

## 2021-09-30 NOTE — Telephone Encounter (Signed)
Pt called and states that she is feeling nausea she is not for sure if it is comeing from the stomach pains she has been having please send it to the [id number] - [place], [place]

## 2021-09-30 NOTE — Telephone Encounter (Signed)
Pt was advised KH 

## 2021-10-02 ENCOUNTER — Encounter (HOSPITAL_COMMUNITY)
Admission: RE | Admit: 2021-10-02 | Discharge: 2021-10-02 | Disposition: A | Discharge status: Home / Self Care | Payer: Medicare PPO | Source: Ambulatory Visit | Attending: Family Medicine | Admitting: Family Medicine

## 2021-10-02 ENCOUNTER — Other Ambulatory Visit: Payer: Self-pay

## 2021-10-02 DIAGNOSIS — R932 Abnormal findings on diagnostic imaging of liver and biliary tract: Secondary | ICD-10-CM | POA: Diagnosis not present

## 2021-10-02 DIAGNOSIS — R1011 Right upper quadrant pain: Secondary | ICD-10-CM | POA: Insufficient documentation

## 2021-10-02 MED ORDER — TECHNETIUM TC 99M MEBROFENIN IV KIT
5.2000 | PACK | Freq: Once | INTRAVENOUS | Status: AC | PRN
  Administered 2021-10-02: 5.2 via INTRAVENOUS

## 2021-10-03 ENCOUNTER — Other Ambulatory Visit: Payer: Self-pay

## 2021-10-03 DIAGNOSIS — G249 Dystonia, unspecified: Secondary | ICD-10-CM

## 2021-10-08 ENCOUNTER — Ambulatory Visit: Payer: Medicare PPO | Admitting: Family Medicine

## 2021-10-08 ENCOUNTER — Other Ambulatory Visit: Payer: Self-pay

## 2021-10-08 ENCOUNTER — Encounter: Payer: Self-pay | Admitting: Family Medicine

## 2021-10-08 VITALS — BP 128/64 | HR 99 | Temp 97.6°F | Wt 204.0 lb

## 2021-10-08 DIAGNOSIS — K829 Disease of gallbladder, unspecified: Secondary | ICD-10-CM | POA: Diagnosis not present

## 2021-10-08 NOTE — Progress Notes (Signed)
° °  Subjective:    Patient ID: Sarah Howell, female    DOB: 1941/01/19, 80 y.o.   MRN: [id number]  HPI She is here for consult.  She has had recent evaluation for gallbladder disease.  Her HIDA scan was indeed positive.  She was initially referred to [facility] however transportation is an issue and she would like to be referred here locally for this.   Review of Systems     Objective:   Physical Exam Alert and in no distress otherwise not examined.  I did review the results of her work-up with her.       Assessment & Plan:  Gall bladder disease She would like to see her previous surgeon, Dr. Barry Dienes and we will attempt to do that.

## 2021-10-10 ENCOUNTER — Ambulatory Visit: Payer: Medicare PPO | Admitting: Surgery

## 2021-10-15 ENCOUNTER — Other Ambulatory Visit: Payer: Self-pay | Admitting: Family Medicine

## 2021-10-15 DIAGNOSIS — I10 Essential (primary) hypertension: Secondary | ICD-10-CM

## 2021-10-30 ENCOUNTER — Telehealth: Payer: Self-pay

## 2021-10-30 NOTE — Telephone Encounter (Signed)
Pt advised she was doing good since the HIDA scan. Pt is not currently on any med for this. Pt is out of town now. [place] JUST FYI

## 2021-11-12 ENCOUNTER — Other Ambulatory Visit: Payer: Self-pay | Admitting: General Surgery

## 2021-11-12 DIAGNOSIS — K828 Other specified diseases of gallbladder: Secondary | ICD-10-CM | POA: Diagnosis not present

## 2021-11-12 DIAGNOSIS — R1032 Left lower quadrant pain: Secondary | ICD-10-CM | POA: Diagnosis not present

## 2021-11-13 DIAGNOSIS — H1089 Other conjunctivitis: Secondary | ICD-10-CM | POA: Diagnosis not present

## 2021-11-13 DIAGNOSIS — H02015 Cicatricial entropion of left lower eyelid: Secondary | ICD-10-CM | POA: Diagnosis not present

## 2021-11-13 DIAGNOSIS — H02012 Cicatricial entropion of right lower eyelid: Secondary | ICD-10-CM | POA: Diagnosis not present

## 2021-12-07 ENCOUNTER — Other Ambulatory Visit: Payer: Self-pay | Admitting: Family Medicine

## 2021-12-07 DIAGNOSIS — Z23 Encounter for immunization: Secondary | ICD-10-CM

## 2022-01-19 ENCOUNTER — Other Ambulatory Visit: Payer: Self-pay | Admitting: Family Medicine

## 2022-01-19 DIAGNOSIS — E785 Hyperlipidemia, unspecified: Secondary | ICD-10-CM

## 2022-02-16 ENCOUNTER — Other Ambulatory Visit: Payer: Self-pay | Admitting: Family Medicine

## 2022-02-16 DIAGNOSIS — J309 Allergic rhinitis, unspecified: Secondary | ICD-10-CM

## 2022-02-24 ENCOUNTER — Ambulatory Visit: Payer: Medicare PPO | Admitting: Family Medicine

## 2022-02-24 ENCOUNTER — Encounter: Payer: Self-pay | Admitting: Family Medicine

## 2022-02-24 VITALS — BP 134/80 | HR 96 | Temp 97.3°F | Wt 201.0 lb

## 2022-02-24 DIAGNOSIS — M1990 Unspecified osteoarthritis, unspecified site: Secondary | ICD-10-CM | POA: Diagnosis not present

## 2022-02-24 NOTE — Progress Notes (Signed)
? ?  Subjective:  ? ? Patient ID: Sarah Howell, female    DOB: 02/08/1941, 81 y.o.   MRN: [id number] ? ?HPI ?She complains of a 91-monthhistory of right hand pain and stiffness and does not occasionally note triggering sensation in one of her fingers.  The left hand has no symptoms as she also does not have symptoms in her wrist elbows or knees. ? ? ?Review of Systems ? ?   ?Objective:  ? Physical Exam ?Exam of the right hand shows no joint swelling with normal motion.  No palpable tenderness.  Normal strength. ? ? ? ?   ?Assessment & Plan:  ?Arthritis ?I explained that this is probably regular arthritis.  Recommend using Tylenol for pain relief.  She was comfortable with that. ? ?

## 2022-02-26 DIAGNOSIS — H02015 Cicatricial entropion of left lower eyelid: Secondary | ICD-10-CM | POA: Diagnosis not present

## 2022-02-26 DIAGNOSIS — H1089 Other conjunctivitis: Secondary | ICD-10-CM | POA: Diagnosis not present

## 2022-02-26 DIAGNOSIS — Z79899 Other long term (current) drug therapy: Secondary | ICD-10-CM | POA: Diagnosis not present

## 2022-02-26 DIAGNOSIS — H02012 Cicatricial entropion of right lower eyelid: Secondary | ICD-10-CM | POA: Diagnosis not present

## 2022-02-28 DIAGNOSIS — Z79899 Other long term (current) drug therapy: Secondary | ICD-10-CM | POA: Diagnosis not present

## 2022-03-06 ENCOUNTER — Ambulatory Visit: Payer: Medicare PPO | Admitting: Family Medicine

## 2022-03-06 ENCOUNTER — Encounter: Payer: Self-pay | Admitting: Family Medicine

## 2022-03-06 VITALS — BP 122/78 | HR 98 | Temp 96.8°F | Wt 200.6 lb

## 2022-03-06 DIAGNOSIS — M1990 Unspecified osteoarthritis, unspecified site: Secondary | ICD-10-CM | POA: Diagnosis not present

## 2022-03-06 NOTE — Patient Instructions (Signed)
Continue with taking the Tylenol and add 2 Aleve twice per day.  If this does not help then we will refer you to a hand specialist to get their input into it okay ?

## 2022-03-06 NOTE — Progress Notes (Signed)
Pain ? ?Acute Office Visit ? ?Subjective:  ? ?  ?Patient ID: Sarah Howell, female    DOB: 12-29-1940, 81 y.o.   MRN: [id number] ? ?Chief Complaint  ?Patient presents with  ? Hand Pain  ?  Both hands but right hand more so than left   ? ? ?HPI ?Patient is in today for evaluation of continued difficulty with right hand.  She has been trying Tylenol and getting minimal relief of her symptoms.  She does not complain of pain or swelling of other joints specifically elbows, knees or back pain. ? ? ?   ?Objective:  ?  ?BP 122/78   Pulse 98   Temp (!) 96.8 ?F (36 ?C)   Wt 200 lb 9.6 oz (91 kg)   SpO2 95%   BMI 40.52 kg/m?  ? ? ?Physical Exam ?Exam of the hands shows minimal discomfort over the carpal bones but no effusions were noted.  No swelling or discomfort of the joints of the fingers.  Knees and elbows have no effusion. ? ?   ?Assessment & Plan:  ?Arthritis ?Continue with taking the Tylenol and add 2 Aleve twice per day.  If this does not help then we will refer you to a hand specialist to get their input into it okay ? ?Jill Alexanders, MD ? ? ?

## 2022-04-03 ENCOUNTER — Telehealth: Payer: Self-pay | Admitting: Family Medicine

## 2022-04-03 NOTE — Telephone Encounter (Signed)
Sarah Howell called in she says the medication for her shoulder/hand is not working and she would like a referral.

## 2022-04-04 ENCOUNTER — Other Ambulatory Visit: Payer: Self-pay

## 2022-04-04 DIAGNOSIS — M79643 Pain in unspecified hand: Secondary | ICD-10-CM

## 2022-04-04 DIAGNOSIS — M25519 Pain in unspecified shoulder: Secondary | ICD-10-CM

## 2022-04-04 NOTE — Telephone Encounter (Signed)
Done KH 

## 2022-04-10 ENCOUNTER — Ambulatory Visit: Payer: Self-pay

## 2022-04-10 ENCOUNTER — Ambulatory Visit (INDEPENDENT_AMBULATORY_CARE_PROVIDER_SITE_OTHER): Payer: Medicare PPO

## 2022-04-10 ENCOUNTER — Ambulatory Visit: Payer: Medicare PPO | Admitting: Orthopedic Surgery

## 2022-04-10 ENCOUNTER — Encounter: Payer: Self-pay | Admitting: Orthopedic Surgery

## 2022-04-10 DIAGNOSIS — M65311 Trigger thumb, right thumb: Secondary | ICD-10-CM

## 2022-04-10 DIAGNOSIS — M79641 Pain in right hand: Secondary | ICD-10-CM

## 2022-04-10 DIAGNOSIS — M778 Other enthesopathies, not elsewhere classified: Secondary | ICD-10-CM | POA: Diagnosis not present

## 2022-04-10 DIAGNOSIS — M79642 Pain in left hand: Secondary | ICD-10-CM

## 2022-04-10 MED ORDER — BETAMETHASONE SOD PHOS & ACET 6 (3-3) MG/ML IJ SUSP
6.0000 mg | INTRAMUSCULAR | Status: AC | PRN
  Administered 2022-04-10: 6 mg via INTRA_ARTICULAR

## 2022-04-10 MED ORDER — LIDOCAINE HCL 1 % IJ SOLN
1.0000 mL | INTRAMUSCULAR | Status: AC | PRN
  Administered 2022-04-10: 1 mL

## 2022-04-10 MED ORDER — MELOXICAM 7.5 MG PO TABS: 7.5000 mg | ORAL_TABLET | Freq: Every day | ORAL | 0 refills | Status: DC

## 2022-04-10 NOTE — Progress Notes (Signed)
Office Visit Note   Patient: Sarah Howell           Date of Birth: 01-Jun-1941           MRN: [id number] Visit Date: 04/10/2022              Requested by: Denita Lung, MD [place],  [place] [address] PCP: Denita Lung, MD   Assessment & Plan: Visit Diagnoses:  1. Pain in left hand   2. Pain in right hand   3. Bilateral hand pain   4. Trigger thumb, right thumb   5. Right wrist tendonitis     Plan: Discussed with patient that she has multiple issues but mostly involving the right side.  She has a right trigger thumb.  We reviewed the nature of trigger thumb as well as its diagnosis, prognosis, and both conservative and surgical treatment options.  She also seems to have extensor tendinitis of the right wrist.  She has pain with resisted wrist extension and some mild swelling of the dorsal aspect of the wrist.  After our discussion, we decided to treat her trigger thumb with a corticosteroid injection.  I will also send a prescription for meloxicam and provide her a right wrist brace for her right wrist pain.  I can see her back in a few months.  Follow-Up Instructions: No follow-ups on file.   Orders:  Orders Placed This Encounter  Procedures   XR Hand Complete Right   XR Hand Complete Left   No orders of the defined types were placed in this encounter.     Procedures: Hand/UE Inj: R thumb A1 for trigger finger on 04/10/2022 11:22 AM Indications: tendon swelling and therapeutic Details: 25 G needle, volar approach Medications: 1 mL lidocaine 1 %; 6 mg betamethasone acetate-betamethasone sodium phosphate 6 (3-3) MG/ML Procedure, treatment alternatives, risks and benefits explained, specific risks discussed. Consent was given by the patient. Immediately prior to procedure a time out was called to verify the correct patient, procedure, equipment, support staff and site/side marked as required. Patient was prepped and draped in the usual sterile fashion.        Clinical Data: No additional findings.   Subjective: Chief Complaint  Patient presents with   Right Hand - Pain   Left Hand - Pain    This is an 81 year old female who presents with bilateral hand pain.  The the right side is much more affected than the left and the left is minimally symptomatic.  Her pain has been going on for 6 to 9 months overall but has worsened over the last 2 months.  She has multiple complaints.  She complains pain at the dorsal aspect of the wrist that is worse with long activity.  She is specifically mentions lifting heavy objects this being a problem for her.  She also describes painful locking of the right thumb at the IP joint.  This is painful and seems to be getting worse.  She denies any numbness or paresthesias.  She does note some mild to moderate swelling of the dorsal aspect the right hand that seems to get worse with prolonged activity.  She has tried Aleve and Tylenol with minimal symptom relief.  She never tried any form of immobilization.    Review of Systems   Objective: Vital Signs: There were no vitals taken for this visit.  Physical Exam Constitutional:      Appearance: Normal appearance.  Cardiovascular:  Rate and Rhythm: Normal rate.     Pulses: Normal pulses.  Pulmonary:     Effort: Pulmonary effort is normal.  Skin:    General: Skin is warm and dry.     Capillary Refill: Capillary refill takes less than 2 seconds.  Neurological:     Mental Status: She is alert.     Right Hand Exam   Tenderness  Right hand tenderness location: TTP at thumb MCP joint/A1 pulley and dorsal aspect of wrist.  Other  Erythema: absent Sensation: normal Pulse: present  Comments:  Mild to moderate swelling of dorsal wrist.  Pain w/ resisted wrist extension w/ limited wrist extension compared to contralateral side.  Visible and palpable triggering of right thumb.  Positive CMC grind test w/out MP hyper-extension or thumb contracture.     Left Hand Exam   Tenderness  Left hand tenderness location: Mildly TTP at base of thumb at [facility] joint.   Other  Erythema: absent Sensation: normal Pulse: present  Comments:  Positive CMC grind test w/out MP hyper-extension or thumb contracture.       Specialty Comments:  No specialty comments available.  Imaging: No results found.   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 Durenda Age 05/30/2011   History of breast cancer in female, s/p L MRM with reconstruction 1989 w [place] 05/30/2011   Hypertension 05/26/2011   Hyperlipidemia LDL goal <100 05/26/2011   Past Medical History:  Diagnosis Date   Allergy    Alopecia    Breast cancer ([place]) 1989   left breast cancer; s/p mastectomy and reconstruction   Cancer ([place])    BREAST   Cough    Cystoid macular edema of both eyes 10/25/2020   Resolved since 2019 commensurate with institution of nightly use of  CPAP to treat active MAC-TEL   Glaucoma    bilateral   Hyperlipidemia    Hypertension    dr Demetrios Isaacs    pcp   Osteopenia    Shortness of breath    WITH EXERTION     Family History  Problem Relation Age of Onset   Colon cancer Mother        dx. 71s; "recurrence" at 83   Prostate cancer Father        dx. 18s   Pancreatic cancer Brother        dx. 81-82   Breast cancer Sister 30   Pancreatic cancer Sister    Parkinson's disease Brother    Prostate cancer  Son 42   Cancer Other        unspecified type   Diabetes Paternal Aunt    Diabetes Paternal Uncle    Breast cancer Cousin        dx. <70s   Breast cancer Cousin        dx. >50    Past Surgical History:  Procedure Laterality Date   BREAST SURGERY     lft br mastectomy and reconstruction   CATARACT EXTRACTION W/PHACO  12/10/2011   Procedure: CATARACT EXTRACTION PHACO AND INTRAOCULAR LENS PLACEMENT ([place]);  Surgeon: Marylynn Pearson, MD;  Location: [facility];  Service: Ophthalmology;  Laterality: Right;   CATARACT EXTRACTION W/PHACO  02/04/2012   Procedure: CATARACT EXTRACTION PHACO AND INTRAOCULAR LENS PLACEMENT ([facility]);  Surgeon: Marylynn Pearson, MD;  Location: [facility];  Service: Ophthalmology;  Laterality: Left;   EYE SURGERY Bilateral 2013   cataract surgery   lt breast ca reconstruction  07/28/1988   MASTECTOMY Left 1989   Orthoscopic surgery lt knee  2002   Orthoscopic surgery rt knee  08/05/2009   Rotoator cuff cleaning     Social History   Occupational History   Not on file  Tobacco Use   Smoking status: Never   Smokeless tobacco: Never  Substance and Sexual Activity   Alcohol use: No    Alcohol/week: 0.0 standard drinks of alcohol   Drug use: No   Sexual activity: Not Currently

## 2022-04-11 ENCOUNTER — Other Ambulatory Visit: Payer: Self-pay | Admitting: Family Medicine

## 2022-04-11 ENCOUNTER — Other Ambulatory Visit: Payer: Self-pay | Admitting: General Surgery

## 2022-04-11 DIAGNOSIS — Z1231 Encounter for screening mammogram for malignant neoplasm of breast: Secondary | ICD-10-CM

## 2022-04-17 ENCOUNTER — Ambulatory Visit: Payer: Self-pay | Admitting: Family Medicine

## 2022-04-18 DIAGNOSIS — K828 Other specified diseases of gallbladder: Secondary | ICD-10-CM | POA: Diagnosis not present

## 2022-04-18 DIAGNOSIS — Z853 Personal history of malignant neoplasm of breast: Secondary | ICD-10-CM | POA: Diagnosis not present

## 2022-04-25 ENCOUNTER — Other Ambulatory Visit: Payer: Self-pay | Admitting: Family Medicine

## 2022-04-25 DIAGNOSIS — I10 Essential (primary) hypertension: Secondary | ICD-10-CM

## 2022-04-25 DIAGNOSIS — E785 Hyperlipidemia, unspecified: Secondary | ICD-10-CM

## 2022-05-07 ENCOUNTER — Other Ambulatory Visit: Payer: Self-pay | Admitting: Orthopedic Surgery

## 2022-05-09 ENCOUNTER — Ambulatory Visit: Payer: Medicare PPO | Admitting: Orthopedic Surgery

## 2022-05-09 ENCOUNTER — Ambulatory Visit (INDEPENDENT_AMBULATORY_CARE_PROVIDER_SITE_OTHER): Payer: Medicare PPO

## 2022-05-09 DIAGNOSIS — G8929 Other chronic pain: Secondary | ICD-10-CM

## 2022-05-09 DIAGNOSIS — M25511 Pain in right shoulder: Secondary | ICD-10-CM | POA: Diagnosis not present

## 2022-05-10 ENCOUNTER — Encounter: Payer: Self-pay | Admitting: Orthopedic Surgery

## 2022-05-10 NOTE — Progress Notes (Signed)
Office Visit Note   Patient: Sarah Howell           Date of Birth: December 12, 1940           MRN: [id number] Visit Date: 05/09/2022 Requested by: Denita Lung, MD [place],  [place] [address] PCP: Denita Lung, MD  Subjective: Chief Complaint  Patient presents with   Other    Shoulder pain    HPI: Sarah Howell is an 81 year old patient with right shoulder pain of several months duration.  Denies a history of injury.  Pain does radiate into the arm but not really below the elbow.  Denies any neck pain.  Pain is not excruciating.  She has stated on 1 or 2 occasions it has gone down to the wrist but that is very infrequent.  She has a history of diagnostic arthroscopy with subacromial decompression years ago.  Takes Aleve and Tylenol for symptoms.  She also reports bilateral hand pain localizing to the thumb region.              ROS: All systems reviewed are negative as they relate to the chief complaint within the history of present illness.  Patient denies  fevers or chills.   Assessment & Plan: Visit Diagnoses:  1. Chronic right shoulder pain     Plan: Impression is right shoulder bursitis with good rotator cuff strength and no evidence of frozen shoulder or weakness.  Could consider an injection into the subacromial space if her symptoms worsen.  She does not really feel like her symptoms are at a level that require that intervention at this time.  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  She wants to hold off on that intervention as well.  She will follow-up with [place] when her symptoms worsen in either the hands or the shoulder.  Injections could be performed at that time.  Follow-Up Instructions: No follow-ups on file.   Orders:  Orders Placed This Encounter  Procedures   XR Shoulder Right   No orders of the  defined types were placed in this encounter.     Procedures: No procedures performed   Clinical Data: No additional findings.  Objective: Vital Signs: There were no vitals taken for this visit.  Physical Exam:   Constitutional: Patient appears well-developed HEENT:  Head: Normocephalic Eyes:EOM are normal Neck: Normal range of motion Cardiovascular: Normal rate Pulmonary/chest: Effort normal Neurologic: Patient is alert Skin: Skin is warm Psychiatric: Patient has normal mood and affect   Ortho Exam: Ortho exam demonstrates positive grind test bilateral hands.  EPL FPL interosseous function intact with palpable radial pulses bilaterally.  No masses lymphadenopathy or skin changes noted in the wrist region.  Patient has reasonable and symmetric wrist flexion extension of about 60 and 50 degrees.  No tenderness over the first dorsal compartment on either wrist.  Right shoulder demonstrates good rotator cuff strength with passive range of motion of 70/95/170.  No coarse grinding or crepitus with internal/external rotation of either shoulder at 90 degrees of abduction.  Cervical spine range of motion is good.  No discrete AC joint tenderness to direct palpation.  Specialty Comments:  No specialty comments available.  Imaging: No results found.   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 Durenda Age 05/30/2011   History of breast cancer in female, s/p L MRM with reconstruction 1989 w [place] 05/30/2011   Hypertension 05/26/2011   Hyperlipidemia LDL goal <100 05/26/2011   Past Medical History:  Diagnosis Date   Allergy    Alopecia    Breast cancer ([place]) 1989   left breast cancer; s/p mastectomy and reconstruction   Cancer ([place])    BREAST   Cough    Cystoid macular edema of both eyes 10/25/2020   Resolved since 2019 commensurate with institution of nightly use of CPAP to treat active MAC-TEL   Glaucoma    bilateral   Hyperlipidemia    Hypertension    dr Demetrios Isaacs    pcp   Osteopenia    Shortness of breath    WITH EXERTION     Family History  Problem Relation Age of Onset   Colon cancer Mother        dx. 56s; "recurrence" at 45   Prostate cancer Father        dx. 65s   Pancreatic cancer Brother        dx. 81-82   Breast cancer Sister 21   Pancreatic cancer Sister    Parkinson's disease Brother    Prostate cancer Son 18   Cancer Other        unspecified type   Diabetes Paternal Aunt    Diabetes Paternal Uncle    Breast cancer Cousin        dx. <70s   Breast cancer Cousin        dx. >50    Past Surgical History:  Procedure Laterality Date   BREAST SURGERY     lft br mastectomy and reconstruction   CATARACT EXTRACTION W/PHACO  12/10/2011   Procedure: CATARACT EXTRACTION PHACO AND INTRAOCULAR LENS PLACEMENT ([place]);  Surgeon: Marylynn Pearson, MD;  Location: [facility];  Service: Ophthalmology;  Laterality: Right;   CATARACT  EXTRACTION W/PHACO  02/04/2012   Procedure: CATARACT EXTRACTION PHACO AND INTRAOCULAR LENS PLACEMENT ([facility]);  Surgeon: Marylynn Pearson, MD;  Location: [facility];  Service: Ophthalmology;  Laterality: Left;   EYE SURGERY Bilateral 2013   cataract surgery   lt breast ca reconstruction  07/28/1988   MASTECTOMY Left 1989   Orthoscopic surgery lt knee  2002   Orthoscopic surgery rt knee  08/05/2009   Rotoator cuff cleaning     Social History   Occupational History   Not on file  Tobacco Use   Smoking status: Never   Smokeless tobacco: Never  Substance and Sexual Activity   Alcohol use: No  Alcohol/week: 0.0 standard drinks of alcohol   Drug use: No   Sexual activity: Not Currently

## 2022-05-19 ENCOUNTER — Telehealth: Payer: Self-pay | Admitting: Family Medicine

## 2022-05-19 NOTE — Telephone Encounter (Signed)
Tried calling patient to  schedule Medicare Annual Wellness Visit (AWV) either virtually or in office. I left my number for patient to call [phone].  No answer   Last AWV ;01/16/21  please schedule at anytime with health coach

## 2022-05-20 ENCOUNTER — Ambulatory Visit
Admission: RE | Admit: 2022-05-20 | Discharge: 2022-05-20 | Disposition: A | Discharge status: Home / Self Care | Payer: Medicare PPO | Source: Ambulatory Visit | Attending: General Surgery | Admitting: General Surgery

## 2022-05-20 DIAGNOSIS — Z1231 Encounter for screening mammogram for malignant neoplasm of breast: Secondary | ICD-10-CM | POA: Diagnosis not present

## 2022-05-26 NOTE — Telephone Encounter (Signed)
Patient returned my call.  She stated she only wants to see Dr Redmond School and has appt already scheduled in 08/2022 same day as her spouse

## 2022-05-26 NOTE — Telephone Encounter (Signed)
Lm asking patient to call [phone]

## 2022-06-10 ENCOUNTER — Ambulatory Visit: Payer: Medicare PPO | Admitting: Orthopedic Surgery

## 2022-06-10 DIAGNOSIS — M65311 Trigger thumb, right thumb: Secondary | ICD-10-CM

## 2022-06-10 DIAGNOSIS — M778 Other enthesopathies, not elsewhere classified: Secondary | ICD-10-CM | POA: Diagnosis not present

## 2022-06-10 NOTE — Progress Notes (Signed)
Office Visit Note   Patient: Sarah Howell           Date of Birth: Feb 24, 1941           MRN: [id number] Visit Date: 06/10/2022              Requested by: Denita Lung, MD [address] [place],  [place] [address] PCP: Denita Lung, MD   Assessment & Plan: Visit Diagnoses:  1. Trigger thumb, right thumb   2. Right wrist tendonitis     Plan: Patient's previous right trigger thumb has resolved following corticosteroid injection.  She also has no pain at the dorsal aspect of her wrist after treatment with an oral anti-inflammatory medication.  Of immobilization.  She has no complaints today.  She is doing very well.  I can see her back again as needed.  Follow-Up Instructions: No follow-ups on file.   Orders:  No orders of the defined types were placed in this encounter.  No orders of the defined types were placed in this encounter.     Procedures: No procedures performed   Clinical Data: No additional findings.   Subjective: Chief Complaint  Patient presents with   Right Hand - Follow-up   Left Hand - Follow-up    This is an 81 year old female who presents for follow-up of bilateral hand and wrist pain.  On the right side, she had a trigger thumb and what seem like extensor tenosynovitis at the wrist.  She underwent corticosteroid ejections of the right thumb at her last visit.  She notes that the triggering has completely resolved.  Her dorsal wrist pain is also resolved following a period of immobilization with a removable brace and an oral anti-inflammatory medication.  Her dorsal swelling has improved.  She has no complaints with the left hand or wrist today..    Review of Systems   Objective: Vital Signs: There were no vitals taken for this visit.  Physical Exam  Right Hand Exam   Tenderness  The patient is experiencing no tenderness.   Range of Motion  The patient has normal right wrist ROM.   Other  Erythema: absent Sensation:  normal Pulse: present  Comments:  No TTP at dorsal wrist or thumb A1 pulley.  No palpable or visible triggering of thumb. Full and painless wrist ROM.       Specialty Comments:  No specialty comments available.  Imaging: No results found.   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 Durenda Age 05/30/2011   History of breast cancer in female, s/p L MRM with reconstruction 1989 w [place] 05/30/2011   Hypertension 05/26/2011   Hyperlipidemia LDL goal <100 05/26/2011   Past Medical History:  Diagnosis Date   Allergy    Alopecia    Breast cancer ([place]) 1989   left breast cancer; s/p mastectomy and reconstruction   Cancer ([place])    BREAST   Cough    Cystoid macular edema of both eyes 10/25/2020   Resolved since 2019 commensurate with institution of nightly use of CPAP to treat active MAC-TEL   Glaucoma    bilateral   Hyperlipidemia    Hypertension    dr Demetrios Isaacs     pcp   Osteopenia    Shortness of breath    WITH EXERTION     Family History  Problem Relation Age of Onset   Colon cancer Mother        dx. 56s; "recurrence" at 53   Prostate cancer Father        dx. 41s   Pancreatic cancer Brother        dx. 81-82   Breast cancer Sister 63   Pancreatic cancer Sister    Parkinson's disease Brother    Prostate cancer Son 27   Cancer Other        unspecified type   Diabetes Paternal Aunt    Diabetes Paternal Uncle    Breast cancer Cousin        dx. <70s   Breast cancer Cousin        dx. >50    Past Surgical History:  Procedure Laterality Date   BREAST SURGERY     lft br mastectomy and reconstruction   CATARACT EXTRACTION W/PHACO  12/10/2011   Procedure: CATARACT EXTRACTION PHACO AND INTRAOCULAR LENS PLACEMENT ([place]);  Surgeon: Marylynn Pearson, MD;  Location: [facility];  Service: Ophthalmology;  Laterality: Right;   CATARACT EXTRACTION W/PHACO  02/04/2012   Procedure: CATARACT EXTRACTION PHACO AND INTRAOCULAR LENS PLACEMENT ([facility]);  Surgeon: Marylynn Pearson, MD;  Location: [facility];  Service: Ophthalmology;  Laterality: Left;   EYE SURGERY Bilateral 2013   cataract surgery   lt breast ca reconstruction  07/28/1988   MASTECTOMY Left 1989   Orthoscopic surgery lt knee  2002   Orthoscopic surgery rt knee  08/05/2009   Rotoator cuff cleaning     Social History   Occupational History   Not on file  Tobacco Use   Smoking status: Never   Smokeless tobacco: Never  Substance and Sexual Activity   Alcohol use: No    Alcohol/week: 0.0 standard drinks of alcohol   Drug use: No   Sexual activity: Not Currently

## 2022-06-11 DIAGNOSIS — H1089 Other conjunctivitis: Secondary | ICD-10-CM | POA: Diagnosis not present

## 2022-06-11 DIAGNOSIS — Z961 Presence of intraocular lens: Secondary | ICD-10-CM | POA: Diagnosis not present

## 2022-06-11 DIAGNOSIS — H02016 Cicatricial entropion of left eye, unspecified eyelid: Secondary | ICD-10-CM | POA: Diagnosis not present

## 2022-06-11 DIAGNOSIS — Z79899 Other long term (current) drug therapy: Secondary | ICD-10-CM | POA: Diagnosis not present

## 2022-06-11 DIAGNOSIS — H02013 Cicatricial entropion of right eye, unspecified eyelid: Secondary | ICD-10-CM | POA: Diagnosis not present

## 2022-06-11 DIAGNOSIS — H1789 Other corneal scars and opacities: Secondary | ICD-10-CM | POA: Diagnosis not present

## 2022-06-12 DIAGNOSIS — L219 Seborrheic dermatitis, unspecified: Secondary | ICD-10-CM | POA: Diagnosis not present

## 2022-06-12 DIAGNOSIS — L659 Nonscarring hair loss, unspecified: Secondary | ICD-10-CM | POA: Diagnosis not present

## 2022-06-12 DIAGNOSIS — L649 Androgenic alopecia, unspecified: Secondary | ICD-10-CM | POA: Diagnosis not present

## 2022-07-02 ENCOUNTER — Encounter: Payer: Self-pay | Admitting: Internal Medicine

## 2022-07-03 ENCOUNTER — Other Ambulatory Visit: Payer: Self-pay | Admitting: Family Medicine

## 2022-07-03 DIAGNOSIS — Z23 Encounter for immunization: Secondary | ICD-10-CM

## 2022-07-07 ENCOUNTER — Other Ambulatory Visit (INDEPENDENT_AMBULATORY_CARE_PROVIDER_SITE_OTHER): Payer: Medicare PPO

## 2022-07-07 DIAGNOSIS — Z23 Encounter for immunization: Secondary | ICD-10-CM

## 2022-08-05 ENCOUNTER — Encounter: Payer: Self-pay | Admitting: Internal Medicine

## 2022-08-18 ENCOUNTER — Encounter: Payer: Self-pay | Admitting: Internal Medicine

## 2022-08-26 ENCOUNTER — Other Ambulatory Visit: Payer: Medicare PPO

## 2022-09-05 ENCOUNTER — Telehealth: Payer: Self-pay

## 2022-09-05 NOTE — Telephone Encounter (Signed)
Pt. Called stating that she has had a cough for a few days now. She said it is a deep cough and is worse when she lays down at night. She wanted to know any recommendations for OTC cough medicine she should be taking. I offered her an apt. But she did not want one.

## 2022-09-12 ENCOUNTER — Emergency Department (HOSPITAL_COMMUNITY): Payer: Medicare PPO

## 2022-09-12 ENCOUNTER — Other Ambulatory Visit: Payer: Self-pay

## 2022-09-12 ENCOUNTER — Encounter (HOSPITAL_COMMUNITY): Payer: Self-pay

## 2022-09-12 ENCOUNTER — Emergency Department (HOSPITAL_COMMUNITY)
Admission: EM | Admit: 2022-09-12 | Discharge: 2022-09-12 | Discharge status: Against Medical Advice | Payer: Medicare PPO | Attending: Emergency Medicine | Admitting: Emergency Medicine

## 2022-09-12 ENCOUNTER — Ambulatory Visit: Payer: Medicare PPO | Admitting: Medical

## 2022-09-12 VITALS — BP 120/70 | HR 64 | Temp 97.6°F | Wt 196.2 lb

## 2022-09-12 DIAGNOSIS — R9431 Abnormal electrocardiogram [ECG] [EKG]: Secondary | ICD-10-CM

## 2022-09-12 DIAGNOSIS — M549 Dorsalgia, unspecified: Secondary | ICD-10-CM | POA: Diagnosis not present

## 2022-09-12 DIAGNOSIS — Z5321 Procedure and treatment not carried out due to patient leaving prior to being seen by health care provider: Secondary | ICD-10-CM | POA: Insufficient documentation

## 2022-09-12 DIAGNOSIS — R059 Cough, unspecified: Secondary | ICD-10-CM | POA: Insufficient documentation

## 2022-09-12 DIAGNOSIS — R110 Nausea: Secondary | ICD-10-CM | POA: Diagnosis not present

## 2022-09-12 DIAGNOSIS — R109 Unspecified abdominal pain: Secondary | ICD-10-CM | POA: Insufficient documentation

## 2022-09-12 DIAGNOSIS — R051 Acute cough: Secondary | ICD-10-CM | POA: Diagnosis not present

## 2022-09-12 DIAGNOSIS — R112 Nausea with vomiting, unspecified: Secondary | ICD-10-CM | POA: Diagnosis not present

## 2022-09-12 DIAGNOSIS — R9389 Abnormal findings on diagnostic imaging of other specified body structures: Secondary | ICD-10-CM | POA: Diagnosis not present

## 2022-09-12 LAB — CBC WITH DIFFERENTIAL/PLATELET
Abs Immature Granulocytes: 0.04 10*3/uL (ref 0.00–0.07)
Basophils Absolute: 0 10*3/uL (ref 0.0–0.1)
Basophils Relative: 0 %
Eosinophils Absolute: 0.2 10*3/uL (ref 0.0–0.5)
Eosinophils Relative: 2 %
HCT: 39.9 % (ref 36.0–46.0)
Hemoglobin: 13.1 g/dL (ref 12.0–15.0)
Immature Granulocytes: 0 %
Lymphocytes Relative: 21 %
Lymphs Abs: 2.1 10*3/uL (ref 0.7–4.0)
MCH: 30.9 pg (ref 26.0–34.0)
MCHC: 32.8 g/dL (ref 30.0–36.0)
MCV: 94.1 fL (ref 80.0–100.0)
Monocytes Absolute: 0.9 10*3/uL (ref 0.1–1.0)
Monocytes Relative: 9 %
Neutro Abs: 6.6 10*3/uL (ref 1.7–7.7)
Neutrophils Relative %: 68 %
Platelets: 295 10*3/uL (ref 150–400)
RBC: 4.24 MIL/uL (ref 3.87–5.11)
RDW: 13.7 % (ref 11.5–15.5)
WBC: 9.9 10*3/uL (ref 4.0–10.5)
nRBC: 0 % (ref 0.0–0.2)

## 2022-09-12 LAB — BASIC METABOLIC PANEL
Anion gap: 13 (ref 5–15)
BUN: 25 mg/dL — ABNORMAL HIGH (ref 8–23)
CO2: 19 mmol/L — ABNORMAL LOW (ref 22–32)
Calcium: 9.8 mg/dL (ref 8.9–10.3)
Chloride: 104 mmol/L (ref 98–111)
Creatinine, Ser: 1.2 mg/dL — ABNORMAL HIGH (ref 0.44–1.00)
GFR, Estimated: 45 mL/min — ABNORMAL LOW (ref >60)
Glucose, Bld: 127 mg/dL — ABNORMAL HIGH (ref 70–99)
Potassium: 4.1 mmol/L (ref 3.5–5.1)
Sodium: 136 mmol/L (ref 135–145)

## 2022-09-12 MED ORDER — ALBUTEROL SULFATE HFA 108 (90 BASE) MCG/ACT IN AERS: 2.0000 | INHALATION_SPRAY | Freq: Four times a day (QID) | RESPIRATORY_TRACT | 0 refills | Status: DC | PRN

## 2022-09-12 MED ORDER — AZITHROMYCIN 250 MG PO TABS: ORAL_TABLET | ORAL | 0 refills | Status: DC

## 2022-09-12 MED ORDER — PROMETHAZINE-DM 6.25-15 MG/5ML PO SYRP: 5.0000 mL | ORAL_SOLUTION | Freq: Four times a day (QID) | ORAL | 0 refills | Status: DC | PRN

## 2022-09-12 NOTE — ED Triage Notes (Signed)
Pt reports she has had a productive cough of [place] mucus x 1 weeks associated with nausea. Cough unrelieved with OTC meds. She also reports right sided lower back pain.

## 2022-09-12 NOTE — ED Notes (Signed)
Pt's family stated that the wait was too long and they are going to take her to her PCP in the morning. Pt seen leaving the ED with family.

## 2022-09-12 NOTE — Progress Notes (Signed)
Subjective:  Sarah Howell is a 81 y.o. female who presents for Chief Complaint  Patient presents with   cough    Cough, stomach pain, low back pain, - went to ER twice this morning due to pain. Had back xray and EKG done      Here for several symptoms.  Accompanied by her husband.    She notes that she went to the emergency department twice in the last 24 hours, both times was triaged and had a long wait so ultimately left.  While she was there she did get labs and x-ray but did not see a provider.  She notes that she has had a cough for about a week.  She started Mucinex DM over-the-counter last week.  She has had cough, nausea, some vomiting the last vomiting 3 nights ago.  She still has cough and lower back pain on the right.  No UTI symptoms.  No fever.  No other body aches or chills.  No nasal congestion, no sore throat, no ear pain.  No sick contacts.  She denies shortness of breath or wheezing.  She denies leg swelling.  No chest pain.  No palpitations.  Been having back pains, hurting right mid to low back.  Intermittent back pain recently and history of back pain in general  Most of the time when lying, having cough, mucous.  Lots of discharge from blowing nose.    No sweats, no dizziness.    No other aggravating or relieving factors.    No other c/o.  Past Medical History:  Diagnosis Date   Allergy    Alopecia    Breast cancer ([place] [facility]) 1989   left breast cancer; s/p mastectomy and reconstruction   Cancer ([place])    BREAST   Cough    Cystoid macular edema of both eyes 10/25/2020   Resolved since 2019 commensurate with institution of nightly use of CPAP to treat active MAC-TEL   Glaucoma    bilateral   Hyperlipidemia    Hypertension    dr Demetrios Isaacs    pcp   Osteopenia    Shortness of breath    WITH EXERTION    Current Outpatient Medications on File Prior to Visit  Medication Sig Dispense Refill   dorzolamide-timolol (COSOPT) 22.3-6.8 MG/ML ophthalmic solution Place 1  drop into both eyes 2 (two) times daily.     erythromycin ophthalmic ointment      finasteride (PROSCAR) 5 MG tablet Take 0.5 tablets (2.5 mg total) by mouth daily. Patient uses for hair growth. 30 tablet 0   fluticasone (FLONASE) 50 MCG/ACT nasal spray PLACE 2 SPRAYS INTO THE NOSE DAILY. 48 mL 1   losartan-hydrochlorothiazide (HYZAAR) 100-12.5 MG tablet TAKE 1 TABLET BY MOUTH EVERY DAY 90 tablet 1   Loteprednol Etabonate (LOTEMAX SM) 0.38 % GEL Apply to eye.     montelukast (SINGULAIR) 10 MG tablet TAKE 1 TABLET BY MOUTH EVERYDAY AT BEDTIME 90 tablet 1   pravastatin (PRAVACHOL) 80 MG tablet TAKE 1 TABLET BY MOUTH EVERY DAY 90 tablet 1   Propylene Glycol (SYSTANE COMPLETE OP) Apply to eye.     cycloSPORINE (CEQUA OP) Apply 1 drop to eye 2 (two) times daily. (Patient not taking: Reported on 05/08/2021)     No current facility-administered medications on file prior to visit.     The following portions of the patient's history were reviewed and updated as appropriate: allergies, current medications, past family history, past medical history, past social history, past surgical history  and problem list.  ROS Otherwise as in subjective above    Objective: BP 120/70   Pulse 64   Temp 97.6 F (36.4 C)   Wt 196 lb 3.2 oz (89 kg)   BMI 39.63 kg/m   BP Readings from Last 3 Encounters:  09/12/22 120/70  09/12/22 (!) 145/128  09/12/22 (!) 146/99   Wt Readings from Last 3 Encounters:  09/12/22 196 lb 3.2 oz (89 kg)  09/12/22 200 lb (90.7 kg)  03/06/22 200 lb 9.6 oz (91 kg)    General appearance: alert, no distress, well developed, well nourished, seated at rest HEENT: normocephalic, sclerae anicteric, conjunctiva pink and moist, TMs pearly, nares patent, no discharge or erythema, pharynx normal Oral cavity: MMM, no lesions Neck: supple, no lymphadenopathy, no thyromegaly, no masses Heart: RRR, normal S1, S2, no murmurs Lungs: Coughing quite a bit, positive rhonchi in the upper right  feels that cleared after coughing quite a few times otherwise clear, no wheezes, no rales Abdomen: +bs, soft, non tender, non distended, no masses, no hepatomegaly, no splenomegaly Pulses: 2+ radial pulses, 2+ pedal pulses, normal cap refill Ext: no edema No leg swelling or tenderness no asymmetry   Assessment: Encounter Diagnoses  Name Primary?   Acute cough Yes   Abnormal chest x-ray    Abnormal EKG    Nausea and vomiting, unspecified vomiting type      Plan: We discussed her symptoms and concerns.  I reviewed over the emergency department notes from the past 24 hours.  She had a chest x-ray early this morning at 2:30 AM at the emergency department showing chronic interstitial coarsening without acute cardiopulmonary process.  She had a basic metabolic panel and CBC that were normal.  She had EKG done as well.  EKG is abnormal compared to EKG in 2015.  After reviewing the EKG I reached out to her local cardiologist, Dr. Christen Butter at [facility] it became apparent that the EKG showed a pacemaker and this patient does not have a pacemaker.  It appears that the EKG done earlier this morning at the emergency department, although it has her name, the EKG was done on a different patient.  We will work to get the EKG removed from the chart as it is incorrect for this patient.  After looking at her clinically and the recent labs, we will treat for a bronchitis/worsening respiratory tract infection.  Begin medications below.  She was unable to complete spirometry given all the coughing.  I asked her to come back in about 2 weeks when breathing normally and back to baseline to repeat a spirometry.  Advised rest, hydration, and she has an appointment in 3 days with Dr. Redmond School here for routine.  We can recheck at that time  Johnica was seen today for cough.  Diagnoses and all orders for this visit:  Acute cough -     Cancel: Spirometry with Graph  Abnormal chest x-ray -     Cancel:  Spirometry with Graph  Abnormal EKG  Nausea and vomiting, unspecified vomiting type  Other orders -     azithromycin (ZITHROMAX) 250 MG tablet; 2 tablets day 1, then 1 tablet days 2-4 -     albuterol (VENTOLIN HFA) 108 (90 Base) MCG/ACT inhaler; Inhale 2 puffs into the lungs every 6 (six) hours as needed for wheezing or shortness of breath. -     promethazine-dextromethorphan (PROMETHAZINE-DM) 6.25-15 MG/5ML syrup; Take 5 mLs by mouth 4 (four) times daily as needed for  cough.    Follow up: Next week as planned

## 2022-09-12 NOTE — ED Notes (Signed)
Pt stated she was here at 4am this morning and left d/t an 8hr wait, pt asked how long the wait would be this time. This RN told her she had no idea of knowing how long the wait would be. Pt stated she was going to leave and go to urgent care or [facility].

## 2022-09-12 NOTE — ED Provider Triage Note (Signed)
Emergency Medicine Provider Triage Evaluation Note  Sarah Howell , a 81 y.o. female  was evaluated in triage.  Pt complains of cough. States that same has been ongoing for the past week. Denies chest pain, shortness of breath, leg pain, or leg swelling. States that the coughing makes her nauseous but denies any vomiting. Endorses back pain but states that this is a chronic issue.   Review of Systems  Positive:  Negative:   Physical Exam  BP (!) 146/99   Pulse 89   Temp 98.7 F (37.1 C)   Resp 16   Ht '4\' 11"'$  (1.499 m)   Wt 90.7 kg   SpO2 99%   BMI 40.40 kg/m  Gen:   Awake, no distress   Resp:  Normal effort  MSK:   Moves extremities without difficulty  Other:    Medical Decision Making  Medically screening exam initiated at 2:07 AM.  Appropriate orders placed.  DARIA MCMEEKIN was informed that the remainder of the evaluation will be completed by another provider, this initial triage assessment does not replace that evaluation, and the importance of remaining in the ED until their evaluation is complete.     Bud Face, PA-C 09/12/22 0210

## 2022-09-15 ENCOUNTER — Encounter: Payer: Self-pay | Admitting: Family Medicine

## 2022-09-15 ENCOUNTER — Ambulatory Visit (INDEPENDENT_AMBULATORY_CARE_PROVIDER_SITE_OTHER): Payer: Medicare PPO | Admitting: Family Medicine

## 2022-09-15 VITALS — BP 132/80 | HR 80 | Ht 60.0 in | Wt 195.2 lb

## 2022-09-15 DIAGNOSIS — Z853 Personal history of malignant neoplasm of breast: Secondary | ICD-10-CM

## 2022-09-15 DIAGNOSIS — E785 Hyperlipidemia, unspecified: Secondary | ICD-10-CM

## 2022-09-15 DIAGNOSIS — H35072 Retinal telangiectasis, left eye: Secondary | ICD-10-CM

## 2022-09-15 DIAGNOSIS — H409 Unspecified glaucoma: Secondary | ICD-10-CM | POA: Diagnosis not present

## 2022-09-15 DIAGNOSIS — I10 Essential (primary) hypertension: Secondary | ICD-10-CM

## 2022-09-15 DIAGNOSIS — H35071 Retinal telangiectasis, right eye: Secondary | ICD-10-CM

## 2022-09-15 DIAGNOSIS — M8580 Other specified disorders of bone density and structure, unspecified site: Secondary | ICD-10-CM

## 2022-09-15 DIAGNOSIS — J309 Allergic rhinitis, unspecified: Secondary | ICD-10-CM | POA: Diagnosis not present

## 2022-09-15 DIAGNOSIS — G4733 Obstructive sleep apnea (adult) (pediatric): Secondary | ICD-10-CM

## 2022-09-15 DIAGNOSIS — Z23 Encounter for immunization: Secondary | ICD-10-CM

## 2022-09-15 DIAGNOSIS — E7439 Other disorders of intestinal carbohydrate absorption: Secondary | ICD-10-CM

## 2022-09-15 DIAGNOSIS — Z0000 Encounter for general adult medical examination without abnormal findings: Secondary | ICD-10-CM

## 2022-09-15 DIAGNOSIS — Z8669 Personal history of other diseases of the nervous system and sense organs: Secondary | ICD-10-CM

## 2022-09-15 DIAGNOSIS — C50912 Malignant neoplasm of unspecified site of left female breast: Secondary | ICD-10-CM

## 2022-09-15 DIAGNOSIS — H16143 Punctate keratitis, bilateral: Secondary | ICD-10-CM

## 2022-09-15 LAB — POCT GLYCOSYLATED HEMOGLOBIN (HGB A1C): Hemoglobin A1C: 5.5 % (ref 4.0–5.6)

## 2022-09-15 MED ORDER — PRAVASTATIN SODIUM 80 MG PO TABS: 80.0000 mg | ORAL_TABLET | Freq: Every day | ORAL | 3 refills | Status: DC

## 2022-09-15 MED ORDER — MONTELUKAST SODIUM 10 MG PO TABS: ORAL_TABLET | ORAL | 3 refills | Status: DC

## 2022-09-15 MED ORDER — LOSARTAN POTASSIUM-HCTZ 100-12.5 MG PO TABS: 1.0000 | ORAL_TABLET | Freq: Every day | ORAL | 3 refills | Status: DC

## 2022-09-15 NOTE — Progress Notes (Signed)
Sarah Howell is a 81 y.o. female who presents for annual wellness visit and follow-up on chronic medical conditions.  She has the following concerns:  Immunizations and Health Maintenance Immunization History  Administered Date(s) Administered   Fluad Quad(high Dose 65+) 07/08/2019, 07/03/2020, 07/09/2021, 07/07/2022   Influenza Split 09/09/2012   Influenza Whole 10/03/2009   Influenza, High Dose Seasonal PF 07/19/2014, 07/24/2015, 08/25/2016, 07/30/2017, 07/15/2018   Influenza,inj,Quad PF,6+ Mos 07/21/2013   PFIZER Comirnaty(Gray Top)Covid-19 Tri-Sucrose Vaccine 02/18/2021   PFIZER(Purple Top)SARS-COV-2 Vaccination 11/11/2019, 11/30/2019, 07/03/2020   Pfizer Covid-19 Vaccine Bivalent Booster 53yr & up 07/22/2021   Pneumococcal Conjugate-13 06/04/2017   Pneumococcal Polysaccharide-23 10/03/2009, 07/19/2014   Tdap 06/11/2016   Zoster Recombinat (Shingrix) 06/26/2017, 11/13/2017   Zoster, Live 10/28/2012   Health Maintenance Due  Topic Date Due   COVID-19 Vaccine (6 - Pfizer risk series) 09/16/2021    Last Pap smear:2022-Dr. CGarwin BrothersLast mammogram:05/20/22 Last colonoscopy:01/05/2018 Last DEXA:02/14/21 Dentist:2023 Ophtho:every 3 mo, next appt 09/24/22 Exercise:daily  Other doctors caring for patient include: GI: Dr. MCollene MaresOp[facility] Derm:Dr.McMichaels   Advanced directives:not yet    Depression screen:  See questionnaire below.     02/24/2022    3:29 PM 01/16/2021   10:16 AM 10/20/2019    9:39 AM 06/17/2018   10:28 AM 06/08/2018    2:21 PM  Depression screen PHQ 2/9  Decreased Interest 0 0 0 0 0  Down, Depressed, Hopeless 0 0 0 0 0  PHQ - 2 Score 0 0 0 0 0    Fall Risk Screen: see questionnaire below.    02/24/2022    3:29 PM 01/16/2021   10:16 AM 10/20/2019    9:38 AM 06/17/2018   10:28 AM 06/08/2018    2:21 PM  Fall Risk   Falls in the past year? 0 0 0 No No  Number falls in past yr: 0 0 0    Injury with Fall? 0 0 0    Risk  for fall due to : No Fall Risks No Fall Risks     Follow up Falls evaluation completed Falls evaluation completed       ADL screen:  See questionnaire below Functional Status Survey:     Review of Systems Constitutional: -, -unexpected weight change, -anorexia, -fatigue Allergy: -sneezing, -itching, -congestion Dermatology: denies changing moles, rash, lumps ENT: -runny nose, -ear pain, -sore throat,  Cardiology:  -chest pain, -palpitations, -orthopnea, Respiratory: -cough, -shortness of breath, -dyspnea on exertion, -wheezing,  Gastroenterology: -abdominal pain, -nausea, -vomiting, -diarrhea, -constipation, -dysphagia Hematology: -bleeding or bruising problems Musculoskeletal: -arthralgias, -myalgias, -joint swelling, -back pain, - Ophthalmology: -vision changes,  Urology: -dysuria, -difficulty urinating,  -urinary frequency, -urgency, incontinence Neurology: -, -numbness, , -memory loss, -falls, -dizziness    PHYSICAL EXAM:  There were no vitals taken for this visit.  General Appearance: Alert, cooperative, no distress, appears stated age Head: Normocephalic, without obvious abnormality, atraumatic Eyes: PERRL, conjunctiva/corneas clear, EOM's intact, fundi benign Ears: Normal TM's and external ear canals Nose: Nares normal, mucosa normal, no drainage or sinus tenderness Throat: Lips, mucosa, and tongue normal; teeth and gums normal Neck: Supple, no lymphadenopathy;  thyroid:  no enlargement/tenderness/nodules; no carotid bruit or JVD Lungs: Clear to auscultation bilaterally without wheezes, rales or ronchi; respirations unlabored Heart: Regular rate and rhythm, S1 and S2 normal, no murmur, rubor gallop Abdomen: Soft, non-tender, nondistended, normoactive bowel sounds,  no masses, no hepatosplenomegaly Extremities: No clubbing, cyanosis or edema Pulses: 2+ and symmetric all extremities Skin:  Skin color, texture, turgor normal, no rashes or lesions Lymph nodes: Cervical,  supraclavicular, and axillary nodes normal Neurologic:  CNII-XII intact, normal strength, sensation and gait; reflexes 2+ and symmetric throughout Psych: Normal mood, affect, hygiene and grooming.  ASSESSMENT/PLAN:    Discussed monthly self breast exams and yearly mammograms; at least 30 minutes of aerobic activity at least 5 days/week and weight-bearing exercise 2x/week; proper sunscreen use reviewed; healthy diet, including goals of calcium and vitamin D intake and alcohol recommendations (less than or equal to 1 drink/day) reviewed; regular seatbelt use; changing batteries in smoke detectors.  Immunization recommendations discussed.  Colonoscopy recommendations reviewed   Medicare Attestation I have personally reviewed: The patient's medical and social history Their use of alcohol, tobacco or illicit drugs Their current medications and supplements The patient's functional ability including ADLs,fall risks, home safety risks, cognitive, and hearing and visual impairment Diet and physical activities Evidence for depression or mood disorders  The patient's weight, height, and BMI have been recorded in the chart.  I have made referrals, counseling, and provided education to the patient based on review of the above and I have provided the patient with a written personalized care plan for preventive services.     Jill Alexanders, MD   09/15/2022

## 2022-09-15 NOTE — Progress Notes (Signed)
Sarah Howell is a 81 y.o. female who presents for annual wellness visit,CPE and follow-up on chronic medical conditions.  She does continue to have difficulty with intermittent right low back pain.  She uses Tylenol for this and it usually takes it away relatively quickly.  She was seen recently here because of difficulty with cough and congestion.  She did go to the emergency room and blood work was drawn however she did not stay long enough to get seen.  She was seen virtually by Audelia Acton and placed on a Z-Pak as well as an inhaler and states that today she is roughly 90% better.  She continues on losartan/HCTZ and having no difficulty with that.  She is also taking her allergy medicines including Flonase and Singulair.  Continues to do nicely on Pravachol.  She has not been using her Mobic.  She does have OSA and apparently is doing well on this.  She states that she gets a green :-) is regularly.  She does have a history of osteopenia and is taking vitamin D and calcium.  She does have remote history of breast cancer.  She sees ophthalmology regularly for entropion as well as glaucoma Immunizations and Health Maintenance Immunization History  Administered Date(s) Administered   Fluad Quad(high Dose 65+) 07/08/2019, 07/03/2020, 07/09/2021, 07/07/2022   Influenza Split 09/09/2012   Influenza Whole 10/03/2009   Influenza, High Dose Seasonal PF 07/19/2014, 07/24/2015, 08/25/2016, 07/30/2017, 07/15/2018   Influenza,inj,Quad PF,6+ Mos 07/21/2013   PFIZER Comirnaty(Gray Top)Covid-19 Tri-Sucrose Vaccine 02/18/2021   PFIZER(Purple Top)SARS-COV-2 Vaccination 11/11/2019, 11/30/2019, 07/03/2020   Pfizer Covid-19 Vaccine Bivalent Booster 77yr & up 07/22/2021   Pneumococcal Conjugate-13 06/04/2017   Pneumococcal Polysaccharide-23 10/03/2009, 07/19/2014   Tdap 06/11/2016   Zoster Recombinat (Shingrix) 06/26/2017, 11/13/2017   Zoster, Live 10/28/2012   Health Maintenance Due  Topic Date Due   COVID-19 Vaccine  (6 - Pfizer risk series) 09/16/2021    Last Pap smear:N/A Last mammogram:7/23 Last colonoscopy:2019 Last DEXA: Dentist: Ophtho:Groat Rankin Exercise:no  Other doctors caring for patient include:Groat Rankin  Advanced directives:no.  Information given Does Patient Have a Medical Advance Directive?: No Would patient like information on creating a medical advance directive?: Yes (MAU/Ambulatory/Procedural Areas - Information given)  Depression screen:  See questionnaire below.     09/15/2022   10:31 AM 02/24/2022    3:29 PM 01/16/2021   10:16 AM 10/20/2019    9:39 AM 06/17/2018   10:28 AM  Depression screen PHQ 2/9  Decreased Interest 0 0 0 0 0  Down, Depressed, Hopeless 0 0 0 0 0  PHQ - 2 Score 0 0 0 0 0    Fall Risk Screen: see questionnaire below.    09/15/2022   10:31 AM 02/24/2022    3:29 PM 01/16/2021   10:16 AM 10/20/2019    9:38 AM 06/17/2018   10:28 AM  Fall Risk   Falls in the past year? 0 0 0 0 No  Number falls in past yr: 0 0 0 0   Injury with Fall? 0 0 0 0   Risk for fall due to : No Fall Risks No Fall Risks No Fall Risks    Follow up Falls evaluation completed Falls evaluation completed Falls evaluation completed      ADL screen:  See questionnaire below Functional Status Survey: Is the patient deaf or have difficulty hearing?: No Does the patient have difficulty seeing, even when wearing glasses/contacts?: No (reading glasses) Does the patient have difficulty concentrating, remembering, or making  decisions?: No Does the patient have difficulty walking or climbing stairs?: No Does the patient have difficulty dressing or bathing?: No Does the patient have difficulty doing errands alone such as visiting a doctor's office or shopping?: No   Review of Systems Constitutional: -, -unexpected weight change, -anorexia, -fatigue Allergy: -sneezing, -itching, -congestion Dermatology: denies changing moles, rash, lumps ENT: -runny nose, -ear pain, -sore throat,   Cardiology:  -chest pain, -palpitations, -orthopnea, Respiratory: -cough, -shortness of breath, -dyspnea on exertion, -wheezing,  Gastroenterology: -abdominal pain, -nausea, -vomiting, -diarrhea, -constipation, -dysphagia Hematology: -bleeding or bruising problems Musculoskeletal: -arthralgias, -myalgias, -joint swelling, -back pain, - Ophthalmology: -vision changes,  Urology: -dysuria, -difficulty urinating,  -urinary frequency, -urgency, incontinence Neurology: -, -numbness, , -memory loss, -falls, -dizziness    PHYSICAL EXAM:  BP 132/80   Pulse 80   Ht 5' (1.524 m)   Wt 195 lb 3.2 oz (88.5 kg)   BMI 38.12 kg/m   General Appearance: Alert, cooperative, no distress, appears stated age Head: Normocephalic, without obvious abnormality, atraumatic Eyes: PERRL, conjunctiva/corneas clear, EOM's intact,  Ears: Normal TM's and external ear canals Nose: Nares normal, mucosa normal, no drainage or sinus tenderness Throat: Lips, mucosa, and tongue normal; teeth and gums normal Neck: Supple, no lymphadenopathy;  thyroid:  no enlargement/tenderness/nodules; no carotid bruit or JVD Lungs: Clear to auscultation bilaterally without wheezes, rales or ronchi; respirations unlabored Heart: Regular rate and rhythm, S1 and S2 normal, no murmur, rubor gallop Skin:  Skin color, texture, turgor normal, no rashes or lesions Lymph nodes: Cervical, supraclavicular, and axillary nodes normal Neurologic:  CNII-XII intact, normal strength, sensation and gait; reflexes 2+ and symmetric throughout Psych: Normal mood, affect, hygiene and grooming. 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  Type 2 macular telangiectasis, right  Allergic rhinitis, mild - Plan: montelukast (SINGULAIR) 10 MG tablet  OSA (obstructive sleep apnea)  Glaucoma, unspecified glaucoma type, unspecified  laterality  Hyperlipidemia LDL goal <100 - Plan: pravastatin (PRAVACHOL) 80 MG tablet, Lipid panel  Morbid obesity (HCC)  Glucose intolerance - Plan: POCT glycosylated hemoglobin (Hb A1C)  History of breast cancer in female, s/p L MRM with reconstruction 1989 w [facility]  Routine general medical examination at a health care facility  Osteopenia, unspecified location  Malignant neoplasm of left female breast, unspecified estrogen receptor status, unspecified site of breast ([place])  Punctate keratitis of both eyes    Discussed  goals of calcium and vitamin D intake   Immunization recommendations discussed.  Colonoscopy recommendations reviewed. Discussed the hemoglobin A1c of 5.8 and strongly encouraged her to make further diet and exercise changes and essentially follow the same rules that her husband is following now to help his diabetes since he is on freestyle libre.   Medicare Attestation I have personally reviewed: The patient's medical and social history Their use of alcohol, tobacco or illicit drugs Their current medications and supplements The patient's functional ability including ADLs,fall risks, home safety risks, cognitive, and hearing and visual impairment Diet and physical activities Evidence for depression or mood disorders  The patient's weight, height, and BMI have been recorded in the chart.  I have made referrals, counseling, and provided education to the patient based on review of the above and I have provided the patient with a written personalized care plan for preventive services.     Jill Alexanders, MD   09/15/2022

## 2022-09-16 LAB — LIPID PANEL
Chol/HDL Ratio: 3.7 ratio (ref 0.0–4.4)
Cholesterol, Total: 159 mg/dL (ref 100–199)
HDL: 43 mg/dL (ref >39)
LDL Chol Calc (NIH): 94 mg/dL (ref 0–99)
Triglycerides: 124 mg/dL (ref 0–149)
VLDL Cholesterol Cal: 22 mg/dL (ref 5–40)

## 2022-09-16 NOTE — Progress Notes (Signed)
I have put in a ticket for this

## 2022-09-24 DIAGNOSIS — H02012 Cicatricial entropion of right lower eyelid: Secondary | ICD-10-CM | POA: Diagnosis not present

## 2022-09-24 DIAGNOSIS — H1089 Other conjunctivitis: Secondary | ICD-10-CM | POA: Diagnosis not present

## 2022-09-24 DIAGNOSIS — Z79899 Other long term (current) drug therapy: Secondary | ICD-10-CM | POA: Diagnosis not present

## 2022-09-24 DIAGNOSIS — H02015 Cicatricial entropion of left lower eyelid: Secondary | ICD-10-CM | POA: Diagnosis not present

## 2022-11-05 ENCOUNTER — Other Ambulatory Visit: Payer: Self-pay | Admitting: Family Medicine

## 2022-11-05 DIAGNOSIS — I10 Essential (primary) hypertension: Secondary | ICD-10-CM

## 2022-11-05 DIAGNOSIS — E785 Hyperlipidemia, unspecified: Secondary | ICD-10-CM

## 2022-11-18 DIAGNOSIS — K828 Other specified diseases of gallbladder: Secondary | ICD-10-CM | POA: Diagnosis not present

## 2022-11-18 DIAGNOSIS — Z853 Personal history of malignant neoplasm of breast: Secondary | ICD-10-CM | POA: Diagnosis not present

## 2022-12-29 DIAGNOSIS — Z79899 Other long term (current) drug therapy: Secondary | ICD-10-CM | POA: Diagnosis not present

## 2022-12-29 DIAGNOSIS — H1089 Other conjunctivitis: Secondary | ICD-10-CM | POA: Diagnosis not present

## 2023-04-01 ENCOUNTER — Telehealth: Payer: Self-pay | Admitting: Family Medicine

## 2023-04-01 NOTE — Telephone Encounter (Signed)
Pt called and is requesting a letter stating that it is to risky for her to fly to [place], they where suppose to go in August with her daughter but then they realized it was to much with them to travel and go on the trip.

## 2023-04-06 ENCOUNTER — Encounter: Payer: Self-pay | Admitting: Family Medicine

## 2023-04-06 NOTE — Telephone Encounter (Signed)
Letter typed & mailed to pt

## 2023-04-17 ENCOUNTER — Other Ambulatory Visit: Payer: Self-pay | Admitting: General Surgery

## 2023-04-17 DIAGNOSIS — Z1231 Encounter for screening mammogram for malignant neoplasm of breast: Secondary | ICD-10-CM

## 2023-04-20 ENCOUNTER — Telehealth: Payer: Self-pay

## 2023-04-20 NOTE — Telephone Encounter (Signed)
Patient called requesting a copy of her immunization report and medication list. She will be traveling soon and needs these documents. Copy of documents printed and placed up front for pick up.

## 2023-04-24 ENCOUNTER — Telehealth: Payer: Self-pay | Admitting: Internal Medicine

## 2023-04-24 NOTE — Telephone Encounter (Signed)
I have sent a message to [place] to order this

## 2023-04-24 NOTE — Telephone Encounter (Signed)
Patient called and states she had an appointment with travel vaccine person and they state she needs Hep A. She is going out of the country in August.  She wants to get this on the 10th when she comes in with her husband at his appointment. Is this ok?

## 2023-05-05 DIAGNOSIS — H1089 Other conjunctivitis: Secondary | ICD-10-CM | POA: Diagnosis not present

## 2023-05-05 DIAGNOSIS — Z79624 Long term (current) use of inhibitors of nucleotide synthesis: Secondary | ICD-10-CM | POA: Diagnosis not present

## 2023-05-05 DIAGNOSIS — H02015 Cicatricial entropion of left lower eyelid: Secondary | ICD-10-CM | POA: Diagnosis not present

## 2023-05-05 DIAGNOSIS — H02019 Cicatricial entropion of unspecified eye, unspecified eyelid: Secondary | ICD-10-CM | POA: Diagnosis not present

## 2023-05-05 DIAGNOSIS — Z79899 Other long term (current) drug therapy: Secondary | ICD-10-CM | POA: Diagnosis not present

## 2023-05-06 ENCOUNTER — Other Ambulatory Visit (INDEPENDENT_AMBULATORY_CARE_PROVIDER_SITE_OTHER): Payer: Medicare PPO

## 2023-05-06 DIAGNOSIS — Z23 Encounter for immunization: Secondary | ICD-10-CM

## 2023-05-22 ENCOUNTER — Other Ambulatory Visit: Payer: Self-pay | Admitting: Family Medicine

## 2023-05-22 DIAGNOSIS — Z23 Encounter for immunization: Secondary | ICD-10-CM

## 2023-05-26 ENCOUNTER — Ambulatory Visit
Admission: RE | Admit: 2023-05-26 | Discharge: 2023-05-26 | Disposition: A | Discharge status: Home / Self Care | Payer: Medicare PPO | Source: Ambulatory Visit | Attending: General Surgery | Admitting: General Surgery

## 2023-05-26 DIAGNOSIS — Z1231 Encounter for screening mammogram for malignant neoplasm of breast: Secondary | ICD-10-CM

## 2023-07-01 ENCOUNTER — Other Ambulatory Visit (INDEPENDENT_AMBULATORY_CARE_PROVIDER_SITE_OTHER): Payer: Medicare PPO

## 2023-07-01 DIAGNOSIS — Z23 Encounter for immunization: Secondary | ICD-10-CM

## 2023-07-02 DIAGNOSIS — K120 Recurrent oral aphthae: Secondary | ICD-10-CM | POA: Diagnosis not present

## 2023-07-02 DIAGNOSIS — L649 Androgenic alopecia, unspecified: Secondary | ICD-10-CM | POA: Diagnosis not present

## 2023-08-26 ENCOUNTER — Other Ambulatory Visit (INDEPENDENT_AMBULATORY_CARE_PROVIDER_SITE_OTHER): Payer: Medicare PPO

## 2023-08-26 DIAGNOSIS — Z23 Encounter for immunization: Secondary | ICD-10-CM

## 2023-09-30 ENCOUNTER — Ambulatory Visit (INDEPENDENT_AMBULATORY_CARE_PROVIDER_SITE_OTHER): Payer: Medicare PPO | Admitting: Family Medicine

## 2023-09-30 ENCOUNTER — Encounter: Payer: Self-pay | Admitting: Family Medicine

## 2023-09-30 VITALS — BP 136/76 | HR 92 | Ht 59.0 in | Wt 195.8 lb

## 2023-09-30 DIAGNOSIS — J309 Allergic rhinitis, unspecified: Secondary | ICD-10-CM

## 2023-09-30 DIAGNOSIS — H4010X0 Unspecified open-angle glaucoma, stage unspecified: Secondary | ICD-10-CM | POA: Diagnosis not present

## 2023-09-30 DIAGNOSIS — E7439 Other disorders of intestinal carbohydrate absorption: Secondary | ICD-10-CM

## 2023-09-30 DIAGNOSIS — G4733 Obstructive sleep apnea (adult) (pediatric): Secondary | ICD-10-CM

## 2023-09-30 DIAGNOSIS — C50012 Malignant neoplasm of nipple and areola, left female breast: Secondary | ICD-10-CM

## 2023-09-30 DIAGNOSIS — Z853 Personal history of malignant neoplasm of breast: Secondary | ICD-10-CM

## 2023-09-30 DIAGNOSIS — I10 Essential (primary) hypertension: Secondary | ICD-10-CM | POA: Diagnosis not present

## 2023-09-30 DIAGNOSIS — Z1379 Encounter for other screening for genetic and chromosomal anomalies: Secondary | ICD-10-CM

## 2023-09-30 DIAGNOSIS — H35072 Retinal telangiectasis, left eye: Secondary | ICD-10-CM | POA: Diagnosis not present

## 2023-09-30 DIAGNOSIS — L659 Nonscarring hair loss, unspecified: Secondary | ICD-10-CM

## 2023-09-30 DIAGNOSIS — E785 Hyperlipidemia, unspecified: Secondary | ICD-10-CM | POA: Diagnosis not present

## 2023-09-30 DIAGNOSIS — M8580 Other specified disorders of bone density and structure, unspecified site: Secondary | ICD-10-CM

## 2023-09-30 DIAGNOSIS — Z0000 Encounter for general adult medical examination without abnormal findings: Secondary | ICD-10-CM

## 2023-09-30 MED ORDER — MONTELUKAST SODIUM 10 MG PO TABS: ORAL_TABLET | ORAL | 3 refills | Status: DC

## 2023-09-30 MED ORDER — LOSARTAN POTASSIUM-HCTZ 100-12.5 MG PO TABS: 1.0000 | ORAL_TABLET | Freq: Every day | ORAL | 3 refills | Status: DC

## 2023-09-30 MED ORDER — PRAVASTATIN SODIUM 80 MG PO TABS: 80.0000 mg | ORAL_TABLET | Freq: Every day | ORAL | 3 refills | Status: DC

## 2023-09-30 NOTE — Progress Notes (Signed)
Sarah Howell is a 82 y.o. female who presents for a CPE and annual wellness visit and follow-up on chronic medical conditions.  She does have underlying allergies that seem to be under good control.  She continues on finasteride to help with her alopecia.  She has a remote history of breast cancer.  She continues to have eye problems and is being followed closely by ophthalmology.  She also has a history of glucose intolerance but the last A1c was 5.5.  She does have a history of OSA and only occasionally uses her CPAP.  She has been tested for osteoporosis and is scheduled in near future for another repeat test. Immunizations and Health Maintenance Immunization History  Administered Date(s) Administered   Fluad Quad(high Dose 65+) 07/08/2019, 07/03/2020, 07/09/2021, 07/07/2022   Fluad Trivalent(High Dose 65+) 07/01/2023   Hepatitis A, Adult 05/06/2023   Influenza Split 09/09/2012   Influenza Whole 10/03/2009   Influenza, High Dose Seasonal PF 07/19/2014, 07/24/2015, 08/25/2016, 07/30/2017, 07/15/2018   Influenza,inj,Quad PF,6+ Mos 07/21/2013   PFIZER Comirnaty(Gray Top)Covid-19 Tri-Sucrose Vaccine 02/18/2021   PFIZER(Purple Top)SARS-COV-2 Vaccination 11/11/2019, 11/30/2019, 07/03/2020   Pfizer Covid-19 Vaccine Bivalent Booster 33yrs & up 07/22/2021   Pfizer(Comirnaty)Fall Seasonal Vaccine 12 years and older 08/26/2022, 08/26/2023   Pneumococcal Conjugate-13 06/04/2017   Pneumococcal Polysaccharide-23 10/03/2009, 07/19/2014   Tdap 06/11/2016   Zoster Recombinant(Shingrix) 06/26/2017, 11/13/2017   Zoster, Live 10/28/2012   There are no preventive care reminders to display for this patient.   Last Pap smear: Jan 2018 Last mammogram: July 2024 Last colonoscopy: March 2019 Last DEXA: April 2022 [facility]ist[facility]  Ophtho: Dr. Raul Del, [facility] Exercise: Walking for about 30 minutes, chair exercise   Other doctors caring for patient include:  Dr. Wonda Olds, OBGYN Dr.  Virgil Benedict, [facility]  Dr. Rose Fillers  Dr. Rise Paganini Dr. Marlyne Beards Dr. Evern Core   Advanced directives: none    Depression screen:  See questionnaire below.     09/30/2023   10:37 AM 09/15/2022   10:31 AM 02/24/2022    3:29 PM 01/16/2021   10:16 AM 10/20/2019    9:39 AM  Depression screen PHQ 2/9  Decreased Interest 0 0 0 0 0  Down, Depressed, Hopeless 0 0 0 0 0  PHQ - 2 Score 0 0 0 0 0    Fall Risk Screen: see questionnaire below.    09/30/2023   10:37 AM 09/15/2022   10:31 AM 02/24/2022    3:29 PM 01/16/2021   10:16 AM 10/20/2019    9:38 AM  Fall Risk   Falls in the past year? 0 0 0 0 0  Number falls in past yr: 0 0 0 0 0  Injury with Fall? 0 0 0 0 0  Risk for fall due to :  No Fall Risks No Fall Risks No Fall Risks   Follow up  Falls evaluation completed Falls evaluation completed Falls evaluation completed     ADL screen:  See questionnaire below Functional Status Survey: Is the patient deaf or have difficulty hearing?: No Does the patient have difficulty seeing, even when wearing glasses/contacts?: No Does the patient have difficulty concentrating, remembering, or making decisions?: No Does the patient have difficulty walking or climbing stairs?: No Does the patient have difficulty dressing or bathing?: No Does the patient have difficulty doing errands alone such as visiting a doctor's office or shopping?: No   Review of Systems Constitutional: -, -unexpected weight change, -anorexia, -fatigue Allergy: -sneezing, -itching, -congestion  Dermatology: denies changing moles, rash, lumps ENT: -runny nose, -ear pain, -sore throat,  Cardiology:  -chest pain, -palpitations, -orthopnea, Respiratory: -cough, -shortness of breath, -dyspnea on exertion, -wheezing,  Gastroenterology: -abdominal pain, -nausea, -vomiting, -diarrhea, -constipation, -dysphagia Hematology: -bleeding or bruising problems Musculoskeletal: -arthralgias, -myalgias, -joint swelling, -back  pain, - Ophthalmology: -vision changes,  Urology: -dysuria, -difficulty urinating,  -urinary frequency, -urgency, incontinence Neurology: -, -numbness, , -memory loss, -falls, -dizziness    PHYSICAL EXAM:   General Appearance: Alert, cooperative, no distress, appears stated age Head: Normocephalic, without obvious abnormality, atraumatic Eyes: PERRL, conjunctiva/corneas clear, EOM's intact,  Ears: Normal TM's and external ear canals Nose: Nares normal, mucosa normal, no drainage or sinus tenderness Throat: Lips, mucosa, and tongue normal; teeth and gums normal Neck: Supple, no lymphadenopathy;  thyroid:  no enlargement/tenderness/nodules; no carotid bruit or JVD Lungs: Clear to auscultation bilaterally without wheezes, rales or ronchi; respirations unlabored Heart: Regular rate and rhythm, S1 and S2 normal, no murmur, rubor gallop Abdomen: Soft, non-tender, nondistended, normoactive bowel sounds,  no masses, no hepatosplenomegaly Skin:  Skin color, texture, turgor normal, no rashes or lesions Lymph nodes: Cervical, supraclavicular, and axillary nodes normal Neurologic:  CNII-XII intact, normal strength, sensation and gait; reflexes 2+ and symmetric throughout Psych: Normal mood, affect, hygiene and grooming.  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  OSA (obstructive sleep apnea)  Open-angle glaucoma of right eye, unspecified glaucoma stage, unspecified open-angle glaucoma type  Morbid obesity (HCC)  Hypertension, unspecified type - Plan: CBC with Differential/Platelet, Comprehensive metabolic panel, losartan-hydrochlorothiazide (HYZAAR) 100-12.5 MG tablet  Hyperlipidemia LDL goal <100 - Plan: Lipid panel, pravastatin (PRAVACHOL) 80 MG tablet  Osteopenia, unspecified location  History of breast cancer in female,  s/p L MRM with reconstruction 1989 w [facility]  Glucose intolerance - Plan: Hemoglobin A1c  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  Colonoscopy recommendations reviewed   Medicare Attestation I have personally reviewed: The patient's medical and social history Their use of alcohol, tobacco or illicit drugs Their current medications and supplements The patient's functional ability including ADLs,fall risks, home safety risks, cognitive, and hearing and visual impairment Diet and physical activities Evidence for depression or mood disorders  The patient's weight, height, and BMI have been recorded in the chart.  I have made referrals, counseling, and provided education to the patient based on review of the above and I have provided the patient with a written personalized care plan for preventive services.     Sharlot Gowda, MD   09/30/2023

## 2023-10-01 LAB — CBC WITH DIFFERENTIAL/PLATELET
Basophils Absolute: 0.1 10*3/uL (ref 0.0–0.2)
Basos: 1 %
EOS (ABSOLUTE): 0.2 10*3/uL (ref 0.0–0.4)
Eos: 2 %
Hematocrit: 41.2 % (ref 34.0–46.6)
Hemoglobin: 13.7 g/dL (ref 11.1–15.9)
Immature Grans (Abs): 0 10*3/uL (ref 0.0–0.1)
Immature Granulocytes: 0 %
Lymphocytes Absolute: 2.1 10*3/uL (ref 0.7–3.1)
Lymphs: 24 %
MCH: 31.4 pg (ref 26.6–33.0)
MCHC: 33.3 g/dL (ref 31.5–35.7)
MCV: 94 fL (ref 79–97)
Monocytes Absolute: 0.7 10*3/uL (ref 0.1–0.9)
Monocytes: 8 %
Neutrophils Absolute: 5.8 10*3/uL (ref 1.4–7.0)
Neutrophils: 65 %
Platelets: 253 10*3/uL (ref 150–450)
RBC: 4.37 x10E6/uL (ref 3.77–5.28)
RDW: 13.6 % (ref 11.7–15.4)
WBC: 8.9 10*3/uL (ref 3.4–10.8)

## 2023-10-01 LAB — LIPID PANEL
Cholesterol, Total: 214 mg/dL — ABNORMAL HIGH (ref 100–199)
HDL: 45 mg/dL (ref >39)
LDL CALC COMMENT:: 4.8 ratio — ABNORMAL HIGH (ref 0.0–4.4)
LDL Chol Calc (NIH): 136 mg/dL — ABNORMAL HIGH (ref 0–99)
Triglycerides: 187 mg/dL — ABNORMAL HIGH (ref 0–149)
VLDL Cholesterol Cal: 33 mg/dL (ref 5–40)

## 2023-10-01 LAB — COMPREHENSIVE METABOLIC PANEL
ALT: 11 IU/L (ref 0–32)
AST: 21 IU/L (ref 0–40)
Albumin: 4.4 g/dL (ref 3.7–4.7)
Alkaline Phosphatase: 81 IU/L (ref 44–121)
BUN/Creatinine Ratio: 18 (ref 12–28)
BUN: 20 mg/dL (ref 8–27)
Bilirubin Total: 0.8 mg/dL (ref 0.0–1.2)
CO2: 21 mmol/L (ref 20–29)
Calcium: 9.8 mg/dL (ref 8.7–10.3)
Chloride: 102 mmol/L (ref 96–106)
Creatinine, Ser: 1.13 mg/dL — ABNORMAL HIGH (ref 0.57–1.00)
Globulin, Total: 3.2 g/dL (ref 1.5–4.5)
Glucose: 105 mg/dL — ABNORMAL HIGH (ref 70–99)
Potassium: 4.3 mmol/L (ref 3.5–5.2)
Sodium: 139 mmol/L (ref 134–144)
Total Protein: 7.6 g/dL (ref 6.0–8.5)
eGFR: 48 mL/min/{1.73_m2} — ABNORMAL LOW (ref >59)

## 2023-10-01 LAB — HEMOGLOBIN A1C
Est. average glucose Bld gHb Est-mCnc: 128 mg/dL
Hgb A1c MFr Bld: 6.1 % — ABNORMAL HIGH (ref 4.8–5.6)

## 2023-10-06 DIAGNOSIS — H02019 Cicatricial entropion of unspecified eye, unspecified eyelid: Secondary | ICD-10-CM | POA: Diagnosis not present

## 2023-10-06 DIAGNOSIS — Z79899 Other long term (current) drug therapy: Secondary | ICD-10-CM | POA: Diagnosis not present

## 2023-11-17 ENCOUNTER — Telehealth: Payer: Medicare PPO | Admitting: Family Medicine

## 2023-11-17 VITALS — Ht 59.0 in | Wt 195.0 lb

## 2023-11-17 DIAGNOSIS — R051 Acute cough: Secondary | ICD-10-CM

## 2023-11-17 MED ORDER — BENZONATATE 200 MG PO CAPS: 200.0000 mg | ORAL_CAPSULE | Freq: Two times a day (BID) | ORAL | 0 refills | Status: DC | PRN

## 2023-11-17 NOTE — Progress Notes (Signed)
   Subjective:    Patient ID: Sarah Howell, female    DOB: Aug 30, 1941, 83 y.o.   MRN: [id number]  HPI Documentation for virtual audio and video telecommunications through Caregility encounter: The patient was located at home. 2 patient identifiers used.  The provider was located in the office. The patient did consent to this visit and is aware of possible charges through their insurance for this visit. The other persons participating in this telemedicine service were none. Time spent on call was 5 minutes and in review of previous records >20 minutes total for counseling and coordination of care. This virtual service is not related to other E/M service within previous 7 days.  She has a 1 week history of a nonproductive cough that is intermittent in nature.  No fever, chills, sore throat, earache, shortness of breath.  She does have underlying allergies but is having no difficulty with them.  She cannot relate the coughing to eating or lying flat.  Review of Systems     Objective:    Physical Exam Alert and in no distress.  Breathing pattern appears normal.       Assessment & Plan:  Acute cough - Plan: benzonatate (TESSALON) 200 MG capsule Changes I will treat the cough but if this does not take it away she is going to need to come in for further evaluation.  Explained that at the present time there is no good reason why she is having the cough but hopefully this will go away.  She was comfortable with that.

## 2023-11-30 ENCOUNTER — Other Ambulatory Visit: Payer: Self-pay | Admitting: General Surgery

## 2023-11-30 DIAGNOSIS — R109 Unspecified abdominal pain: Secondary | ICD-10-CM | POA: Diagnosis not present

## 2023-11-30 DIAGNOSIS — Z853 Personal history of malignant neoplasm of breast: Secondary | ICD-10-CM | POA: Diagnosis not present

## 2023-11-30 DIAGNOSIS — Z800 Family history of malignant neoplasm of digestive organs: Secondary | ICD-10-CM | POA: Diagnosis not present

## 2023-11-30 DIAGNOSIS — Z803 Family history of malignant neoplasm of breast: Secondary | ICD-10-CM | POA: Diagnosis not present

## 2023-12-22 ENCOUNTER — Encounter: Payer: Self-pay | Admitting: Internal Medicine

## 2023-12-31 ENCOUNTER — Other Ambulatory Visit: Payer: Medicare PPO

## 2024-01-06 ENCOUNTER — Ambulatory Visit
Admission: RE | Admit: 2024-01-06 | Discharge: 2024-01-06 | Disposition: A | Discharge status: Home / Self Care | Payer: Medicare PPO | Source: Ambulatory Visit | Attending: General Surgery | Admitting: General Surgery

## 2024-01-06 DIAGNOSIS — Z800 Family history of malignant neoplasm of digestive organs: Secondary | ICD-10-CM

## 2024-01-06 DIAGNOSIS — R1031 Right lower quadrant pain: Secondary | ICD-10-CM | POA: Diagnosis not present

## 2024-01-06 DIAGNOSIS — Z853 Personal history of malignant neoplasm of breast: Secondary | ICD-10-CM

## 2024-01-06 DIAGNOSIS — R109 Unspecified abdominal pain: Secondary | ICD-10-CM

## 2024-01-06 MED ORDER — IOPAMIDOL (ISOVUE-300) INJECTION 61%
80.0000 mL | Freq: Once | INTRAVENOUS | Status: AC | PRN
  Administered 2024-01-06: 80 mL via INTRAVENOUS

## 2024-02-09 DIAGNOSIS — H1089 Other conjunctivitis: Secondary | ICD-10-CM | POA: Diagnosis not present

## 2024-02-09 DIAGNOSIS — Z79899 Other long term (current) drug therapy: Secondary | ICD-10-CM | POA: Diagnosis not present

## 2024-02-25 ENCOUNTER — Encounter: Payer: Self-pay | Admitting: Family Medicine

## 2024-02-25 ENCOUNTER — Ambulatory Visit: Admitting: Family Medicine

## 2024-02-25 VITALS — BP 132/70 | HR 101 | Temp 97.8°F | Wt 192.8 lb

## 2024-02-25 DIAGNOSIS — J209 Acute bronchitis, unspecified: Secondary | ICD-10-CM | POA: Diagnosis not present

## 2024-02-25 DIAGNOSIS — D171 Benign lipomatous neoplasm of skin and subcutaneous tissue of trunk: Secondary | ICD-10-CM | POA: Diagnosis not present

## 2024-02-25 MED ORDER — AZITHROMYCIN 500 MG PO TABS: 500.0000 mg | ORAL_TABLET | Freq: Every day | ORAL | 0 refills | Status: DC

## 2024-02-25 NOTE — Patient Instructions (Addendum)
 Take Tylenol  for fever aches and pains also NyQuil

## 2024-02-25 NOTE — Progress Notes (Signed)
   Subjective:    Patient ID: Sarah Howell, female    DOB: 1941/01/12, 83 y.o.   MRN: [id number]  HPI She complains of a 2-week history of cough and slight headache, fatigue as well as generalized neck and knee arthralgias.  No fever, chills, sore throat or earache.  She does have underlying allergies that seem to be under good control.   Review of Systems     Objective:    Physical Exam Alert and in no distress. Tympanic membranes and canals are normal. Pharyngeal area is normal. Neck is supple without adenopathy or thyromegaly. Cardiac exam shows a regular sinus rhythm without murmurs or gallops. Lungs are clear to auscultation. 3 cm round smooth movable lesion is noted on her back.       Assessment & Plan:  Acute bronchitis, unspecified organism - Plan: azithromycin  (ZITHROMAX ) 500 MG tablet  Lipoma of torso Apparently the lesion on her back has been there for several years.  Explained that when this finally starts causing difficulty, we normally limited

## 2024-03-22 ENCOUNTER — Other Ambulatory Visit: Payer: Self-pay | Admitting: Family Medicine

## 2024-03-22 ENCOUNTER — Ambulatory Visit: Payer: Self-pay | Admitting: *Deleted

## 2024-03-22 DIAGNOSIS — Z23 Encounter for immunization: Secondary | ICD-10-CM

## 2024-03-22 NOTE — Telephone Encounter (Signed)
  Chief Complaint: cough , nausea, bloating Symptoms: coughing spells productive at times, clear mucus, feeling nausea with bloating  Frequency: couple of day s Pertinent Negatives: Patient denies chest pain no difficulty breathing no fever no worsening sx Disposition: [] ED /[] Urgent Care (no appt availability in office) / [x] Appointment(In office/virtual)/ []  [facility]/ [] Home Care/ [] Refused Recommended Disposition /[] [facility]/ []  Follow-up with PCP Additional Notes:   Patient requesting to come in same day as husband appt on Thursday due to both coming for evaluation. CAL notified to assist with scheduling due to no available appt noted for Thursday . Appt scheduled Thursday by CAL.        Copied from CRM [id number]. Topic: Clinical - Red Word Triage >> Mar 22, 2024  2:42 PM Fonda T wrote: [facility] that prompted transfer to Nurse Triage: Increased cough, mucous, nauseated, bloating Reason for Disposition  SEVERE coughing spells (e.g., whooping sound after coughing, vomiting after coughing)  Answer Assessment - Initial Assessment Questions 1. ONSET: "When did the cough begin?"      Couple of days  2. SEVERITY: "How bad is the cough today?"      Coughing spells periodically  3. SPUTUM: "Describe the color of your sputum" (none, dry cough; clear, Freund, yellow, green)     Clear  4. HEMOPTYSIS: "Are you coughing up any blood?" If so ask: "How much?" (flecks, streaks, tablespoons, etc.)     na 5. DIFFICULTY BREATHING: "Are you having difficulty breathing?" If Yes, ask: "How bad is it?" (e.g., mild, moderate, severe)    - MILD: No SOB at rest, mild SOB with walking, speaks normally in sentences, can lie down, no retractions, pulse < 100.    - MODERATE: SOB at rest, SOB with minimal exertion and prefers to sit, cannot lie down flat, speaks in phrases, mild retractions, audible wheezing, pulse 100-120.    - SEVERE: Very SOB at rest, speaks in single words,  struggling to breathe, sitting hunched forward, retractions, pulse > 120      No  6. FEVER: "Do you have a fever?" If Yes, ask: "What is your temperature, how was it measured, and when did it start?"     na 7. CARDIAC HISTORY: "Do you have any history of heart disease?" (e.g., heart attack, congestive heart failure)      Na  8. LUNG HISTORY: "Do you have any history of lung disease?"  (e.g., pulmonary embolus, asthma, emphysema)     na 9. PE RISK FACTORS: "Do you have a history of blood clots?" (or: recent major surgery, recent prolonged travel, bedridden)     na 10. OTHER SYMPTOMS: "Do you have any other symptoms?" (e.g., runny nose, wheezing, chest pain)       Cough , bloating , nausea  11. PREGNANCY: "Is there any chance you are pregnant?" "When was your last menstrual period?"       na 12. TRAVEL: "Have you traveled out of the country in the last month?" (e.g., travel history, exposures)       na  Protocols used: Cough - Acute Productive-A-AH

## 2024-03-22 NOTE — Telephone Encounter (Signed)
 Refill approved.

## 2024-03-24 ENCOUNTER — Ambulatory Visit: Admitting: Family Medicine

## 2024-03-24 ENCOUNTER — Encounter: Payer: Self-pay | Admitting: Family Medicine

## 2024-03-24 DIAGNOSIS — R051 Acute cough: Secondary | ICD-10-CM

## 2024-03-24 DIAGNOSIS — J209 Acute bronchitis, unspecified: Secondary | ICD-10-CM

## 2024-03-24 MED ORDER — ALBUTEROL SULFATE HFA 108 (90 BASE) MCG/ACT IN AERS: 2.0000 | INHALATION_SPRAY | Freq: Four times a day (QID) | RESPIRATORY_TRACT | 0 refills | Status: DC | PRN

## 2024-03-24 MED ORDER — AZITHROMYCIN 500 MG PO TABS: 500.0000 mg | ORAL_TABLET | Freq: Every day | ORAL | 0 refills | Status: DC

## 2024-03-24 MED ORDER — BENZONATATE 200 MG PO CAPS: 200.0000 mg | ORAL_CAPSULE | Freq: Two times a day (BID) | ORAL | 0 refills | Status: DC | PRN

## 2024-03-24 NOTE — Progress Notes (Signed)
   Subjective:    Patient ID: Sarah Howell, female    DOB: 1941-07-10, 83 y.o.   MRN: [id number]  HPI She is here for a recheck.  She was seen May 1 and treated with azithromycin .  She did get slightly better but is now still having difficulty mainly with coughing and chest congestion but no fever, chills, sore throat or earache.   Review of Systems     Objective:    Physical Exam Alert and in no distress. Tympanic membranes and canals are normal. Pharyngeal area is normal. Neck is supple without adenopathy or thyromegaly. Cardiac exam shows a regular sinus rhythm without murmurs or gallops. Lungs show scattered rhonchi.      Assessment & Plan:  Acute cough - Plan: benzonatate  (TESSALON ) 200 MG capsule  Acute bronchitis, unspecified organism - Plan: albuterol  (VENTOLIN  HFA) 108 (90 Base) MCG/ACT inhaler, azithromycin  (ZITHROMAX ) 500 MG tablet  She is to leave a message on MyChart in 1 week to let me know how she is doing.  Explained I might need to give azithromycin  again or possibly switch to a different antibiotic.  She was comfortable with that. Return in 4 months for a general diabetes check

## 2024-03-24 NOTE — Patient Instructions (Signed)
 Call and leave a message on MyChart in a week to let me know how you are doing

## 2024-03-31 ENCOUNTER — Telehealth: Payer: Self-pay | Admitting: Family Medicine

## 2024-03-31 DIAGNOSIS — R051 Acute cough: Secondary | ICD-10-CM

## 2024-03-31 MED ORDER — AMOXICILLIN-POT CLAVULANATE 875-125 MG PO TABS: 1.0000 | ORAL_TABLET | Freq: Two times a day (BID) | ORAL | 0 refills | Status: DC

## 2024-03-31 NOTE — Telephone Encounter (Signed)
 She is having trouble with My Chart  Taking the meds you gave and cough meds and inhaler and she is still coughing her head up   Coughing up mucous      Please call pt

## 2024-04-05 ENCOUNTER — Other Ambulatory Visit: Payer: Self-pay | Admitting: Family Medicine

## 2024-04-05 DIAGNOSIS — R051 Acute cough: Secondary | ICD-10-CM

## 2024-04-05 MED ORDER — BENZONATATE 200 MG PO CAPS: 200.0000 mg | ORAL_CAPSULE | Freq: Two times a day (BID) | ORAL | 0 refills | Status: DC | PRN

## 2024-04-05 NOTE — Telephone Encounter (Signed)
 Copied from CRM [id number]. Topic: Clinical - Medication Refill >> Apr 05, 2024  2:59 PM Rosaria Common wrote: Medication: benzonatate  (TESSALON ) 200 MG capsule  Has the patient contacted their pharmacy? Yes (Agent: If no, request that the patient contact the pharmacy for the refill. If patient does not wish to contact the pharmacy document the reason why and proceed with request.) (Agent: If yes, when and what did the pharmacy advise?)  This is the patient's preferred pharmacy:  [facility] [phone] Jon[place] Nest[place], [place] - [address] [place] [address] Phone: [phone] Fax: [phone]  Is this the correct pharmacy for this prescription? Yes If no, delete pharmacy and type the correct one.   Has the prescription been filled recently? No  Is the patient out of the medication? Yes  Has the patient been seen for an appointment in the last year OR does the patient have an upcoming appointment? Yes  Can we respond through MyChart? No  Agent: Please be advised that Rx refills may take up to 3 business days. We ask that you follow-up with your pharmacy.

## 2024-04-13 ENCOUNTER — Encounter: Payer: Self-pay | Admitting: Family Medicine

## 2024-04-13 ENCOUNTER — Other Ambulatory Visit: Payer: Self-pay | Admitting: General Surgery

## 2024-04-13 DIAGNOSIS — J849 Interstitial pulmonary disease, unspecified: Secondary | ICD-10-CM

## 2024-04-13 DIAGNOSIS — R052 Subacute cough: Secondary | ICD-10-CM

## 2024-04-13 DIAGNOSIS — Z1231 Encounter for screening mammogram for malignant neoplasm of breast: Secondary | ICD-10-CM

## 2024-04-13 DIAGNOSIS — J209 Acute bronchitis, unspecified: Secondary | ICD-10-CM

## 2024-04-14 ENCOUNTER — Ambulatory Visit
Admission: RE | Admit: 2024-04-14 | Discharge: 2024-04-14 | Disposition: A | Discharge status: Home / Self Care | Source: Ambulatory Visit | Attending: Family Medicine | Admitting: Family Medicine

## 2024-04-14 DIAGNOSIS — R918 Other nonspecific abnormal finding of lung field: Secondary | ICD-10-CM | POA: Diagnosis not present

## 2024-04-15 ENCOUNTER — Other Ambulatory Visit: Payer: Self-pay

## 2024-04-15 DIAGNOSIS — J849 Interstitial pulmonary disease, unspecified: Secondary | ICD-10-CM

## 2024-04-15 NOTE — Addendum Note (Signed)
 Addended by: Watson Hacking on: 04/15/2024 08:59 AM   Modules accepted: Orders

## 2024-04-20 ENCOUNTER — Ambulatory Visit: Admitting: Pulmonary Disease

## 2024-04-20 ENCOUNTER — Encounter: Payer: Self-pay | Admitting: Pulmonary Disease

## 2024-04-20 ENCOUNTER — Other Ambulatory Visit: Payer: Self-pay | Admitting: Family Medicine

## 2024-04-20 VITALS — BP 120/86 | HR 98 | Ht 59.0 in | Wt 188.6 lb

## 2024-04-20 DIAGNOSIS — J209 Acute bronchitis, unspecified: Secondary | ICD-10-CM

## 2024-04-20 DIAGNOSIS — G4733 Obstructive sleep apnea (adult) (pediatric): Secondary | ICD-10-CM

## 2024-04-20 DIAGNOSIS — J849 Interstitial pulmonary disease, unspecified: Secondary | ICD-10-CM | POA: Diagnosis not present

## 2024-04-20 DIAGNOSIS — F17210 Nicotine dependence, cigarettes, uncomplicated: Secondary | ICD-10-CM

## 2024-04-20 NOTE — Progress Notes (Signed)
 Sarah Howell    [id number]    Apr 17, 1941  Primary Care Physician:Lalonde, Norleen JAYSON, MD  Referring Physician: [name], MD [address] [place],  [place] [address]  Chief complaint: Consult for dyspnea, interstitial lung disease  HPI: 83 y.o. 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  Discussed the use of AI scribe software for clinical note transcription with the patient, who gave verbal consent to proceed.  History of Present Illness Sarah Howell is an 83 year old female with interstitial lung disease who presents with persistent cough and shortness of breath. She is accompanied by her husband.  She experiences persistent coughing and shortness of breath, particularly when walking short distances, which has been ongoing for many years. She was treated for acute bronchitis with two courses of antibiotics and cough medications, but symptoms persist. The shortness of breath has been worsening over time.  She has a history of ocular cicatricial pemphigoid, diagnosed at [facility], and has been on Cellcept for over two years, initially at a dose of 1 gram twice daily, now reduced to 500 mg twice daily due to improvement in her eye condition. Her vision has improved to 20/20 in one eye and 20/25 in the other.  She has a positive ANA, indicating an autoimmune process. She inquires if her lung issues could be related to her autoimmune condition.  No significant exposures such as smoking, pets, or occupational hazards that could contribute to her lung condition. She is on montelukast  for allergies, taken at bedtime, and denies a history of asthma, although she has family members with asthma.  She reports a diagnosis of mild sleep apnea from a 2019 sleep study, with an AHI of 9.1, but  has not been using her CPAP machine recently. She mentions using cough drops and Tessalon  Perles for her cough, but finds them ineffective.   Pets: Used to have a dog Occupation: Worked as an Programmer, systems in school.  Currently retired Exposures: No mold, hot tub, Jacuzzi.  No feather pillow or comforter No h/o chemo/XRT/amiodarone/macrodantin/MTX  No exposure to asbestos, silica or other organic allergens  Smoking history: Never smoker Travel history: No significant travel history Relevant family history: No family history of lung disease  Outpatient Encounter Medications as of 04/20/2024  Medication Sig   albuterol  (VENTOLIN  HFA) 108 (90 Base) MCG/ACT inhaler Inhale 2 puffs into the lungs every 6 (six) hours as needed for wheezing or shortness of breath.   amoxicillin -clavulanate (AUGMENTIN ) 875-125 MG tablet Take 1 tablet by mouth 2 (two) times daily.   azithromycin  (ZITHROMAX ) 500 MG tablet Take 1 tablet (500 mg total) by mouth daily.   benzonatate  (TESSALON ) 200 MG capsule Take 1 capsule (200 mg total) by mouth 2 (two) times daily as needed for cough.   finasteride  (PROSCAR ) 5 MG tablet Take 0.5 tablets (2.5 mg total) by mouth daily. Patient uses for hair growth.   fluticasone  (FLONASE ) 50 MCG/ACT nasal spray PLACE 2 SPRAYS INTO THE NOSE DAILY.   losartan -hydrochlorothiazide (HYZAAR) 100-12.5 MG tablet Take 1 tablet by mouth daily.   montelukast  (SINGULAIR ) 10 MG tablet TAKE 1 TABLET BY MOUTH EVERYDAY AT BEDTIME   mycophenolate (CELLCEPT) 250 MG capsule Take 500 mg by mouth 2 (two) times daily.   pravastatin  (PRAVACHOL ) 80 MG tablet Take 1 tablet (80 mg  total) by mouth daily.   No facility-administered encounter medications on file as of 04/20/2024.    Allergies as of 04/20/2024 - Review Complete 04/20/2024  Allergen Reaction Noted   Fish allergy Nausea And Vomiting    Other Nausea And Vomiting 06/17/2018    Past Medical History:  Diagnosis Date   Allergy    Alopecia    Breast  cancer (HCC) 1989   left breast cancer; s/p mastectomy and reconstruction   Cancer (HCC)    BREAST   Cough    Cystoid macular edema of both eyes 10/25/2020   Resolved since 2019 commensurate with institution of nightly use of CPAP to treat active MAC-TEL   Glaucoma    bilateral   Hyperlipidemia    Hypertension    dr lalone    pcp   Osteopenia    Shortness of breath    WITH EXERTION     Past Surgical History:  Procedure Laterality Date   BREAST SURGERY     lft br mastectomy and reconstruction   CATARACT EXTRACTION W/PHACO  12/10/2011   Procedure: CATARACT EXTRACTION PHACO AND INTRAOCULAR LENS PLACEMENT (IOC);  Surgeon: Gaither Quan, MD;  Location: [facility] OR;  Service: Ophthalmology;  Laterality: Right;   CATARACT EXTRACTION W/PHACO  02/04/2012   Procedure: CATARACT EXTRACTION PHACO AND INTRAOCULAR LENS PLACEMENT (IOC);  Surgeon: Gaither Quan, MD;  Location: [facility] OR;  Service: Ophthalmology;  Laterality: Left;   EYE SURGERY Bilateral 2013   cataract surgery   lt breast ca reconstruction  07/28/1988   MASTECTOMY Left 1989   Orthoscopic surgery lt knee  2002   Orthoscopic surgery rt knee  08/05/2009   Rotoator cuff cleaning      Family History  Problem Relation Age of Onset   Colon cancer Mother        dx. 31s; recurrence at 3   Prostate cancer Father        dx. 60s   Pancreatic cancer Brother        dx. 81-82   Breast cancer Sister 64   Pancreatic cancer Sister    Parkinson's disease Brother    Prostate cancer Son 35   Cancer Other        unspecified type   Diabetes Paternal Aunt    Diabetes Paternal Uncle    Breast cancer Cousin        dx. <70s   Breast cancer Cousin        dx. >50    Social History   Socioeconomic History   Marital status: Married    Spouse name: Not on file   Number of children: Not on file   Years of education: Not on file   Highest education level: Not on file  Occupational History   Not on file  Tobacco Use   Smoking status: Never    Smokeless tobacco: Never  Substance and Sexual Activity   Alcohol use: No    Alcohol/week: 0.0 standard drinks of alcohol   Drug use: No   Sexual activity: Not Currently  Other Topics Concern   Not on file  Social History Narrative   Not on file   Social Drivers of Health   Financial Resource Strain: Not on file  Food Insecurity: Not on file  Transportation Needs: Not on file  Physical Activity: Not on file  Stress: Not on file  Social Connections: Not on file  Intimate Partner Violence: Not on file    Review of systems: Review of Systems  Constitutional: Negative  for fever and chills.  HENT: Negative.   Eyes: Negative for blurred vision.  Respiratory: as per HPI  Cardiovascular: Negative for chest pain and palpitations.  Gastrointestinal: Negative for vomiting, diarrhea, blood per rectum. Genitourinary: Negative for dysuria, urgency, frequency and hematuria.  Musculoskeletal: Negative for myalgias, back pain and joint pain.  Skin: Negative for itching and rash.  Neurological: Negative for dizziness, tremors, focal weakness, seizures and loss of consciousness.  Endo/Heme/Allergies: Negative for environmental allergies.  Psychiatric/Behavioral: Negative for depression, suicidal ideas and hallucinations.  All other systems reviewed and are negative.  Physical Exam: Blood pressure 120/86, pulse 98, height 4' 11 (1.499 m), weight 188 lb 9.6 oz (85.5 kg), SpO2 94%. Gen:      No acute distress HEENT:  EOMI, sclera anicteric Neck:     No masses; no thyromegaly Lungs:    Clear to auscultation bilaterally; normal respiratory effort CV:         Regular rate and rhythm; no murmurs Abd:      + bowel sounds; soft, non-tender; no palpable masses, no distension Ext:    No edema; adequate peripheral perfusion Skin:      Warm and dry; no rash Neuro: alert and oriented x 3 Psych: normal mood and affect  Data Reviewed: Imaging: Chest x-ray 12/05/2011-probable chronic interstitial  lung disease CT abdomen pelvis 01/06/2024-chronic interstitial changes, groundglass opacities, reticulation at the visualized lung base Chest x-ray 04/14/2024-bilateral chronic interstitial lung disease right greater than left I have reviewed the images personally.  PFTs:  Labs: Labs ([facility]) 05/21/2021 Positive: ANA (1:640 Speckled) -Negative: MPA, CMP, CBC, CRP, Pro3, lysozyme, RF, TB, CCP, RPR  Assessment & Plan Interstitial lung disease Interstitial lung disease identified on imaging since 2013, with potential progression. Symptoms include chronic cough and dyspnea. Differential diagnosis includes autoimmune etiology due to positive ANA, possibly related to ocular cicatricial pemphigoid. No significant environmental or occupational exposures identified. Biopsy not recommended due to age-related risks. Cellcept may benefit lung condition, but further testing is required to confirm diagnosis and adjust treatment. - Order chest CT to assess lung condition - Perform lung function test - Conduct blood tests for additional antibodies - Continue Cellcept, adjust dosage based on test results  Acute bronchitis Recent acute bronchitis treated with antibiotics and cough medication. Persistent cough and dyspnea possibly related to interstitial lung disease. 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 Managed with Cellcept, improving vision. Current dosage is 500 mg twice daily, reduced from 1 g twice daily due to improvement. Monitoring for vision changes to adjust dosage as needed. - Continue Cellcept 500 mg twice daily  Sleep apnea, mild Mild sleep apnea diagnosed in 2019 with an AHI of 9.1. Symptoms include occasional daytime somnolence. CPAP machine available but not in use. Encouraged to resume use to improve symptoms. - Encourage CPAP use for 3-4 hours  nightly  Recommendations: CTD serologies CT chest, PFTs Continue CellCept  Sarah Outten MD [facility] 04/20/2024, 11:41 AM  CC: Joyce Norleen BROCKS, MD

## 2024-04-20 NOTE — Patient Instructions (Signed)
 VISIT SUMMARY:  Today, we discussed your persistent cough and shortness of breath, which have been ongoing for many years and have worsened over time. We reviewed your history of interstitial lung disease, ocular cicatricial pemphigoid, and mild sleep apnea. We also talked about your recent treatment for acute bronchitis and the possibility that your lung issues could be related to your autoimmune condition.  YOUR PLAN:  -INTERSTITIAL LUNG DISEASE: Interstitial lung disease is a condition that causes scarring of the lungs, leading to chronic cough and difficulty breathing. We will order a chest CT scan, perform lung function tests, and conduct blood tests for additional antibodies to better understand your condition. You should continue taking Cellcept, and we may adjust the dosage based on your test results.  -ACUTE BRONCHITIS: Acute bronchitis is an infection of the airways that causes coughing and mucus production. Although you were treated with antibiotics and cough medication, your persistent cough may be related to your interstitial lung disease. We will monitor your symptoms and adjust your treatment based on the results of your upcoming tests.  -OCULAR CICATRICIAL PEMPHIGOID: Ocular cicatricial pemphigoid is a rare autoimmune condition that affects the eyes. Your vision has improved with Cellcept, which you are currently taking at a dose of 500 mg twice daily. We will continue this dosage and monitor for any changes in your vision to adjust the dosage if necessary.  -SLEEP APNEA, MILD: Sleep apnea is a condition where breathing repeatedly stops and starts during sleep. You have mild sleep apnea and have not been using your CPAP machine recently. We encourage you to resume using your CPAP machine for 3-4 hours each night to help improve your symptoms.  INSTRUCTIONS:  Please schedule a chest CT scan, lung function tests, and blood tests for additional antibodies as soon as possible. Continue  taking Cellcept as prescribed and monitor for any changes in your vision. Resume using your CPAP machine for at least 3-4 hours each night. Follow up with us  after completing the tests to discuss the results and any necessary adjustments to your treatment plan.

## 2024-04-22 ENCOUNTER — Other Ambulatory Visit: Payer: Self-pay | Admitting: Family Medicine

## 2024-04-22 DIAGNOSIS — R051 Acute cough: Secondary | ICD-10-CM

## 2024-04-22 LAB — ANTI-SCLERODERMA ANTIBODY: Scleroderma (Scl-70) (ENA) Antibody, IgG: <1 AI

## 2024-04-22 LAB — ANA,IFA RA DIAG PNL W/RFLX TIT/PATN
Anti Nuclear Antibody (ANA): POSITIVE — AB
Cyclic Citrullin Peptide Ab: <16 U
Rheumatoid fact SerPl-aCnc: 12 [IU]/mL (ref <14)

## 2024-04-22 LAB — ANTI-NUCLEAR AB-TITER (ANA TITER): ANA Titer 1: 1:80 {titer} — ABNORMAL HIGH

## 2024-04-22 LAB — SJOGRENS SYNDROME-A EXTRACTABLE NUCLEAR ANTIBODY: SSA (Ro) (ENA) Antibody, IgG: <1 AI

## 2024-04-22 LAB — SJOGRENS SYNDROME-B EXTRACTABLE NUCLEAR ANTIBODY: SSB (La) (ENA) Antibody, IgG: <1 AI

## 2024-04-25 ENCOUNTER — Encounter: Payer: Self-pay | Admitting: Pulmonary Disease

## 2024-04-25 LAB — HYPERSENSITIVITY PNEUMONITIS
A. Pullulans Abs: NEGATIVE
A.Fumigatus #1 Abs: NEGATIVE
Micropolyspora faeni, IgG: NEGATIVE
Pigeon Serum Abs: NEGATIVE
Thermoact. Saccharii: NEGATIVE
Thermoactinomyces vulgaris, IgG: NEGATIVE

## 2024-04-27 ENCOUNTER — Ambulatory Visit
Admission: RE | Admit: 2024-04-27 | Discharge: 2024-04-27 | Disposition: A | Discharge status: Home / Self Care | Source: Ambulatory Visit | Attending: Pulmonary Disease | Admitting: Pulmonary Disease

## 2024-04-27 DIAGNOSIS — R590 Localized enlarged lymph nodes: Secondary | ICD-10-CM | POA: Diagnosis not present

## 2024-04-27 DIAGNOSIS — J849 Interstitial pulmonary disease, unspecified: Secondary | ICD-10-CM

## 2024-04-27 DIAGNOSIS — J479 Bronchiectasis, uncomplicated: Secondary | ICD-10-CM | POA: Diagnosis not present

## 2024-04-27 DIAGNOSIS — R918 Other nonspecific abnormal finding of lung field: Secondary | ICD-10-CM | POA: Diagnosis not present

## 2024-05-05 ENCOUNTER — Ambulatory Visit: Payer: Self-pay | Admitting: Pulmonary Disease

## 2024-05-06 LAB — MYOMARKER 3 PLUS PROFILE (RDL)

## 2024-05-15 ENCOUNTER — Other Ambulatory Visit: Payer: Self-pay | Admitting: Family Medicine

## 2024-05-15 DIAGNOSIS — J209 Acute bronchitis, unspecified: Secondary | ICD-10-CM

## 2024-05-20 ENCOUNTER — Telehealth: Payer: Self-pay

## 2024-05-20 NOTE — Progress Notes (Signed)
   05/20/2024  Patient ID: Sarah Howell, female   DOB: 27-Mar-1941, 83 y.o.   MRN: [id number]  Pharmacy Quality Measure Review  This patient is appearing on a report for being at risk of failing the adherence measure for hypertension (ACEi/ARB) medications this calendar year.   Medication: Losartan -HCTZ Last fill date: 12/24/23 for 90 day supply  Left voicemail for patient to return my call at their convenience.  Jon VEAR Lindau, PharmD Clinical Pharmacist [phone]

## 2024-05-24 NOTE — Procedures (Signed)
Result scanned to media

## 2024-05-26 ENCOUNTER — Ambulatory Visit
Admission: RE | Admit: 2024-05-26 | Discharge: 2024-05-26 | Disposition: A | Discharge status: Home / Self Care | Source: Ambulatory Visit | Attending: General Surgery | Admitting: General Surgery

## 2024-05-26 ENCOUNTER — Ambulatory Visit

## 2024-05-26 DIAGNOSIS — Z1231 Encounter for screening mammogram for malignant neoplasm of breast: Secondary | ICD-10-CM | POA: Diagnosis not present

## 2024-06-07 ENCOUNTER — Other Ambulatory Visit: Payer: Self-pay | Admitting: Family Medicine

## 2024-06-07 DIAGNOSIS — J209 Acute bronchitis, unspecified: Secondary | ICD-10-CM

## 2024-07-14 ENCOUNTER — Emergency Department (HOSPITAL_COMMUNITY)
Admission: EM | Admit: 2024-07-14 | Discharge: 2024-07-14 | Discharge status: Against Medical Advice | Attending: Emergency Medicine | Admitting: Emergency Medicine

## 2024-07-14 ENCOUNTER — Other Ambulatory Visit: Payer: Self-pay

## 2024-07-14 ENCOUNTER — Encounter (HOSPITAL_COMMUNITY): Payer: Self-pay

## 2024-07-14 ENCOUNTER — Emergency Department (HOSPITAL_COMMUNITY)

## 2024-07-14 DIAGNOSIS — Z5321 Procedure and treatment not carried out due to patient leaving prior to being seen by health care provider: Secondary | ICD-10-CM | POA: Diagnosis not present

## 2024-07-14 DIAGNOSIS — M25562 Pain in left knee: Secondary | ICD-10-CM | POA: Diagnosis not present

## 2024-07-14 DIAGNOSIS — M25561 Pain in right knee: Secondary | ICD-10-CM | POA: Diagnosis not present

## 2024-07-14 DIAGNOSIS — M25531 Pain in right wrist: Secondary | ICD-10-CM | POA: Insufficient documentation

## 2024-07-14 DIAGNOSIS — W01198A Fall on same level from slipping, tripping and stumbling with subsequent striking against other object, initial encounter: Secondary | ICD-10-CM | POA: Insufficient documentation

## 2024-07-14 DIAGNOSIS — M25532 Pain in left wrist: Secondary | ICD-10-CM | POA: Diagnosis not present

## 2024-07-14 DIAGNOSIS — M170 Bilateral primary osteoarthritis of knee: Secondary | ICD-10-CM | POA: Diagnosis not present

## 2024-07-14 DIAGNOSIS — M180 Bilateral primary osteoarthritis of first carpometacarpal joints: Secondary | ICD-10-CM | POA: Diagnosis not present

## 2024-07-14 DIAGNOSIS — W19XXXA Unspecified fall, initial encounter: Secondary | ICD-10-CM

## 2024-07-14 NOTE — ED Provider Triage Note (Signed)
 Emergency Medicine Provider Triage Evaluation Note  Sarah Howell , a 83 y.o. female  was evaluated in triage.  Pt complains of bilateral knee pain, bilateral wrist pain.  Review of Systems  Positive: Knee and wrist pain Negative: Chest pain, shortness of breath, dizziness, weakness,  Physical Exam  BP (!) 138/102 (BP Location: Right Arm)   Pulse 100   Temp 97.8 F (36.6 C)   Resp 16   SpO2 96%  Gen:   Awake, no distress   Resp:  Normal effort  MSK:   Moves extremities without difficulty.  Patient has full range of motion and mild pain to palpation.  Patient has been able to ambulate without difficulty. Other:    Medical Decision Making  Medically screening exam initiated at 6:59 PM.  Appropriate orders placed.  Sarah Howell was informed that the remainder of the evaluation will be completed by another provider, this initial triage assessment does not replace that evaluation, and the importance of remaining in the ED until their evaluation is complete.  83 year old female presents ED with complaints of fall last night while sitting on recliner and fell forward onto the ground.  Patient reports it was an accident because she was trying to move from into the recliner and landed on her knees.  Patient reported there was no obvious injury at time of fall.  Patient was then able to ambulate and stand and move without assistance.  Patient reported the pain started worsening today and she wanted to be evaluated for x-ray.   Sarah Howell, [place] 07/14/24 1901

## 2024-07-14 NOTE — ED Triage Notes (Signed)
 Pt coming in after a fall last night on 3e pt reports that she hit her head alittle bit pt denies thinners pt denies loc. Pt reports bilateral knee pain pt reporting pain in her hands as well. Pt reporting 6/10 pain at this time. Pt states that there was an incident report made at the time of the fall

## 2024-07-14 NOTE — ED Notes (Signed)
 Patient also reports bilateral wrist pain with movement.

## 2024-07-18 ENCOUNTER — Telehealth: Payer: Self-pay

## 2024-07-18 NOTE — Telephone Encounter (Signed)
 Copied from CRM [id number]. Topic: Clinical - Medical Advice >> Jul 12, 2024  1:50 PM Daniela A wrote: Reason for CRM: Patient's daughter called in requesting to speak with Dr. 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  Called and let the pt to know of this information and to sign DPR form when she comes into office 9/25. Nfn

## 2024-07-20 ENCOUNTER — Telehealth: Payer: Self-pay

## 2024-07-20 NOTE — Telephone Encounter (Signed)
 Unable to find pt in airview. ATC pt x1. Lmtcb. I need to know if pt is still treating her OSA or not.

## 2024-07-20 NOTE — Telephone Encounter (Signed)
 I called pt back. Pt states she uses her CPAP machine off and on. Pt was unsure if she ha an SD card and states she uses [employer] as her DME. Pt also stated she has not received supplies in years.   I will call [employer] to see if they can provide a download.   Kai from [place] states pt is inactive with them so they wouldn't be able to pull a download. She has been inactive with them since March 6th, 2024.   I called the pt back. I asked her if she would bring her CPAP machine and pt was concerned as to why she needed to do that. I informed pt that we can not find a compliance report and if she brought her machine, this could help the provider. Pt them stated she knows nothing will be on the machine as she only used it once and not off and on anymore. NFN

## 2024-07-21 ENCOUNTER — Ambulatory Visit: Admitting: Primary Care

## 2024-07-21 ENCOUNTER — Encounter: Payer: Self-pay | Admitting: Primary Care

## 2024-07-21 ENCOUNTER — Ambulatory Visit: Admitting: *Deleted

## 2024-07-21 VITALS — BP 124/68 | HR 107 | Temp 98.3°F | Ht 59.0 in | Wt 159.0 lb

## 2024-07-21 DIAGNOSIS — J849 Interstitial pulmonary disease, unspecified: Secondary | ICD-10-CM

## 2024-07-21 DIAGNOSIS — R768 Other specified abnormal immunological findings in serum: Secondary | ICD-10-CM

## 2024-07-21 DIAGNOSIS — L121 Cicatricial pemphigoid: Secondary | ICD-10-CM | POA: Diagnosis not present

## 2024-07-21 DIAGNOSIS — G4733 Obstructive sleep apnea (adult) (pediatric): Secondary | ICD-10-CM

## 2024-07-21 DIAGNOSIS — R053 Chronic cough: Secondary | ICD-10-CM | POA: Diagnosis not present

## 2024-07-21 LAB — PULMONARY FUNCTION TEST
DL/VA % pred: 69 %
DL/VA: 2.9 ml/min/mmHg/L
DLCO cor % pred: 57 %
DLCO cor: 8.88 ml/min/mmHg
DLCO unc % pred: 57 %
DLCO unc: 8.88 ml/min/mmHg
FEF 25-75 Post: 0.43 L/s
FEF 25-75 Pre: 2.04 L/s
FEF2575-%Change-Post: -79 %
FEF2575-%Pred-Post: 48 %
FEF2575-%Pred-Pre: 233 %
FEV1-%Change-Post: -14 %
FEV1-%Pred-Post: 85 %
FEV1-%Pred-Pre: 99 %
FEV1-Post: 1.11 L
FEV1-Pre: 1.31 L
FEV1FVC-%Change-Post: -4 %
FEV1FVC-%Pred-Pre: 105 %
FEV6-%Change-Post: -13 %
FEV6-%Pred-Post: 85 %
FEV6-%Pred-Pre: 98 %
FEV6-Post: 1.42 L
FEV6-Pre: 1.64 L
FEV6FVC-%Pred-Post: 107 %
FEV6FVC-%Pred-Pre: 107 %
FVC-%Change-Post: -10 %
FVC-%Pred-Post: 84 %
FVC-%Pred-Pre: 94 %
FVC-Post: 1.52 L
FVC-Pre: 1.7 L
Post FEV1/FVC ratio: 74 %
Post FEV6/FVC ratio: 100 %
Pre FEV1/FVC ratio: 77 %
Pre FEV6/FVC Ratio: 100 %
RV % pred: 98 %
RV: 2.21 L
TLC % pred: 91 %
TLC: 3.94 L

## 2024-07-21 NOTE — Patient Instructions (Addendum)
  VISIT SUMMARY: Today, you came in for a follow-up on your breathing test results. We discussed your history of interstitial lung disease and the recent positive ANA test. You also shared your experiences with your CPAP machine and the discomfort with your current mask. We reviewed your current medications and discussed potential adjustments and follow-up plans.  YOUR PLAN: -INTERSTITIAL LUNG DISEASE WITH PULMONARY FIBROSIS: This condition involves scarring of the lung tissue, likely due to an underlying autoimmune disease. Your lung function is currently good, but there is some scarring affecting your lung's ability to transfer oxygen. Continue taking CellCept 500 mg twice daily. We will refer you to a rheumatologist for further evaluation of the potential autoimmune condition. We will also send a message to Dr. Theophilus regarding potential medication adjustments. Please monitor for any symptoms of lower respiratory tract infection and consider antibiotics if necessary. We will follow up with another breathing test in six months.  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 You are currently managing it with CellCept, which also benefits your lung condition. Continue taking CellCept 500 mg twice daily.  -OBSTRUCTIVE SLEEP APNEA: This condition causes interruptions in your breathing during sleep. You mentioned difficulty tolerating your current CPAP mask and prefer a nasal mask. We will order CPAP supplies and a nasal mask fitting through [facility]. Please continue using your CPAP machine consistently.  -CHRONIC COUGH AND DYSPNEA: You have intermittent symptoms of cough and shortness of breath. There is no current cough with colored mucus. Mucinex  may help clear mucus. Take Mucinex  1-2 times daily as needed for cough. Monitor for any changes in your cough or mucus color and report if changes occur.  INSTRUCTIONS: Please follow up with a rheumatologist for  further evaluation of your potential autoimmune condition. We will also follow up with another breathing test in six months. Continue monitoring for any symptoms of lower respiratory tract infection and report any changes in your cough or mucus color.  Follow-up 4-6 months with Dr. Lonza for ILD or sooner if needed
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
## 2024-07-21 NOTE — Patient Instructions (Signed)
 Full PFT performed today.

## 2024-07-21 NOTE — Progress Notes (Signed)
 Full PFT performed today.

## 2024-07-25 DIAGNOSIS — J849 Interstitial pulmonary disease, unspecified: Secondary | ICD-10-CM | POA: Insufficient documentation

## 2024-07-26 ENCOUNTER — Encounter: Payer: Self-pay | Admitting: Family Medicine

## 2024-07-26 ENCOUNTER — Ambulatory Visit: Admitting: Family Medicine

## 2024-07-26 VITALS — BP 138/72 | HR 82 | Ht 60.0 in | Wt 185.0 lb

## 2024-07-26 DIAGNOSIS — H1089 Other conjunctivitis: Secondary | ICD-10-CM | POA: Diagnosis not present

## 2024-07-26 DIAGNOSIS — E7439 Other disorders of intestinal carbohydrate absorption: Secondary | ICD-10-CM | POA: Diagnosis not present

## 2024-07-26 DIAGNOSIS — G4733 Obstructive sleep apnea (adult) (pediatric): Secondary | ICD-10-CM | POA: Diagnosis not present

## 2024-07-26 DIAGNOSIS — J849 Interstitial pulmonary disease, unspecified: Secondary | ICD-10-CM | POA: Diagnosis not present

## 2024-07-26 DIAGNOSIS — Z23 Encounter for immunization: Secondary | ICD-10-CM

## 2024-07-26 NOTE — Progress Notes (Signed)
   Subjective:    Patient ID: Sarah Howell, female    DOB: 04/21/1941, 83 y.o.   MRN: [id number]  History of Present Illness   Sarah Howell is an 83 year old female with interstitial lung disease and sleep apnea who presents for follow-up.  She is trying to understand the origin of her interstitial lung disease and is concerned about her current health status. She has been following up with a pulmonary specialist and recently underwent a breathing test.  She has sleep apnea and uses a CPAP machine, but has been using it inconsistently due to discomfort and issues related to her eyes. She is interested in obtaining a CPAP mask that fits only on her nose, as the current one covers her mouth and causes discomfort, especially when coughing. She aims to achieve seven to eight hours of sleep using the CPAP machine.  She has been dealing with eye issues, which she initially thought were related to her eyes alone. She mentions a diagnosis of cicatrizing conjunctivitis and is awaiting further evaluation for her eyes.   She is in the process of getting CellCept.       Review of Systems     Objective:    Physical Exam Alert and in no distress.  Hemoglobin A1c is 5.7           Assessment & Plan:  Assessment and Plan    Interstitial lung disease Diagnosed by pulmonary specialist. Specifics pending.  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 - Consult rheumatology for further assessment and management.  Obstructive sleep apnea Non-compliance with CPAP due to discomfort and eye issues. Awaiting new nasal CPAP mask. - Continue current CPAP use until new mask obtained.     Glucose intolerance  Need for influenza vaccination - Plan: Flu vaccine HIGH DOSE PF(Fluzone Trivalent)  Need for COVID-19 vaccine - Plan: Pfizer Comirnaty  Covid-19 Vaccine 75yrs & older  At this point we will continue to monitor the situation.  I did briefly discussed interstitial lung  disease with her.  She is apparently scheduled to see rheumatology in the near future.

## 2024-08-15 DIAGNOSIS — H02013 Cicatricial entropion of right eye, unspecified eyelid: Secondary | ICD-10-CM | POA: Diagnosis not present

## 2024-08-15 DIAGNOSIS — Z8507 Personal history of malignant neoplasm of pancreas: Secondary | ICD-10-CM | POA: Diagnosis not present

## 2024-08-15 DIAGNOSIS — H02016 Cicatricial entropion of left eye, unspecified eyelid: Secondary | ICD-10-CM | POA: Diagnosis not present

## 2024-08-15 DIAGNOSIS — Z79899 Other long term (current) drug therapy: Secondary | ICD-10-CM | POA: Diagnosis not present

## 2024-08-15 DIAGNOSIS — L121 Cicatricial pemphigoid: Secondary | ICD-10-CM | POA: Diagnosis not present

## 2024-08-15 DIAGNOSIS — J849 Interstitial pulmonary disease, unspecified: Secondary | ICD-10-CM | POA: Diagnosis not present

## 2024-08-15 DIAGNOSIS — H1089 Other conjunctivitis: Secondary | ICD-10-CM | POA: Diagnosis not present

## 2024-08-30 ENCOUNTER — Encounter: Payer: Self-pay | Admitting: Family Medicine

## 2024-08-30 ENCOUNTER — Ambulatory Visit: Payer: Self-pay | Admitting: Family Medicine

## 2024-08-30 VITALS — BP 138/74 | HR 98 | Ht 60.0 in | Wt 185.0 lb

## 2024-08-30 DIAGNOSIS — G4733 Obstructive sleep apnea (adult) (pediatric): Secondary | ICD-10-CM

## 2024-08-30 DIAGNOSIS — J849 Interstitial pulmonary disease, unspecified: Secondary | ICD-10-CM

## 2024-08-30 MED ORDER — ALBUTEROL SULFATE HFA 108 (90 BASE) MCG/ACT IN AERS: 2.0000 | INHALATION_SPRAY | Freq: Four times a day (QID) | RESPIRATORY_TRACT | 0 refills | Status: AC | PRN

## 2024-08-30 NOTE — Progress Notes (Signed)
   Subjective:    Patient ID: Sarah Howell, female    DOB: 1941/08/31, 83 y.o.   MRN: [id number]  Discussed the use of AI scribe software for clinical note transcription with the patient, who gave verbal consent to proceed.  History of Present Illness   Sarah Howell is an 83 year old female with interstitial lung disease and mild sleep apnea who presents for a follow-up regarding her CPAP equipment and mask fitting.  She uses her CPAP machine nightly for six to seven hours but finds the current mask uncomfortable as it covers her mouth, causing discomfort, mucus, and coughing. She prefers a nasal mask that fits only over her nose. Her CPAP machine is over 79 years old, leading to difficulties due to its age and lack of proper fit.  She has interstitial lung disease and is under the care of a pulmonary specialist, with her next appointment scheduled for December. She has undergone a breathing test previously and was contacted for another test, which she is reluctant to take due to the cost and difficulty. She is currently on CellCept for her lung condition.  She has seen an ophthalmologist recently.           Review of Systems     Objective:    Physical Exam Alert and in no distress otherwise not examined           Assessment & Plan:  Obstructive sleep apnea Mild obstructive sleep apnea managed with CPAP therapy. Reports improved energy and stamina but discomfort with current mask. Prefers nasal mask. CPAP machine over five years old, may need replacement. - Ordered new CPAP machine and supplies through [facility]. - Refit for nasal mask through [facility]. - Ensured CPAP usage for at least 30 days for evaluation. - Wrote prescription for nasal mask and new CPAP machine.  Interstitial lung disease Managed with CellCept. Follow-up on breathing test recommended in six months. - Cancelled December pulmonary appointment. - Will follow up on breathing test in six months.

## 2024-09-07 ENCOUNTER — Telehealth: Payer: Self-pay | Admitting: *Deleted

## 2024-09-07 NOTE — Telephone Encounter (Signed)
 Copied from CRM [id number]. Topic: Appointments - Appointment Cancel/Reschedule >> Sep 05, 2024 11:22 AM Leila C wrote: Patient/patient representative is calling to cancel or reschedule an appointment. Refer to attachments for appointment information.  Patient wants to cancel 10/11/24 at 2 pm PFT because it's too much for her at this time and will only come for 10/12/24 at 9:15 am with Dr. Theophilus. >> Sep 05, 2024 11:23 AM Leila C wrote: FYI

## 2024-09-21 ENCOUNTER — Other Ambulatory Visit: Payer: Self-pay | Admitting: Family Medicine

## 2024-09-21 DIAGNOSIS — Z23 Encounter for immunization: Secondary | ICD-10-CM

## 2024-10-06 ENCOUNTER — Other Ambulatory Visit: Payer: Self-pay

## 2024-10-06 ENCOUNTER — Telehealth: Payer: Self-pay | Admitting: Family Medicine

## 2024-10-06 DIAGNOSIS — G4733 Obstructive sleep apnea (adult) (pediatric): Secondary | ICD-10-CM

## 2024-10-06 NOTE — Telephone Encounter (Signed)
 Pt stopped by office & wanted to know about her new CPAP machine, she was having difficulty breathing today, but said couldn't be seen today.  (Last appt states Dr L wrote new Rx) Please call her regarding CPAP

## 2024-10-06 NOTE — Telephone Encounter (Signed)
 Called Patient to let her know that she will need to do a home sleep study before getting approved for C-PAP.

## 2024-10-07 ENCOUNTER — Telehealth: Payer: Self-pay | Admitting: Pulmonary Disease

## 2024-10-07 NOTE — Telephone Encounter (Signed)
 Fax received from Dr. Evalene Chancy with [facility] to perform a Left total knee arthroplasty on patient.  Patient needs surgery clearance. Surgery is pending. Patient was seen on 07/21/24. Office protocol is a risk assessment can be sent to surgeon if patient has been seen in 60 days or less.   Pt has upcoming appt with Dr. Theophilus 10/12/24.  I have added to appt notes that she will need risk assessment at this visit and routing to clearance pool until this is complete.

## 2024-10-11 ENCOUNTER — Encounter

## 2024-10-12 ENCOUNTER — Encounter: Payer: Self-pay | Admitting: Pulmonary Disease

## 2024-10-12 ENCOUNTER — Ambulatory Visit: Admitting: Pulmonary Disease

## 2024-10-12 VITALS — BP 148/82 | HR 91 | Temp 97.9°F | Ht 59.0 in | Wt 183.0 lb

## 2024-10-12 DIAGNOSIS — J849 Interstitial pulmonary disease, unspecified: Secondary | ICD-10-CM | POA: Diagnosis not present

## 2024-10-12 DIAGNOSIS — R7689 Other specified abnormal immunological findings in serum: Secondary | ICD-10-CM

## 2024-10-12 DIAGNOSIS — J479 Bronchiectasis, uncomplicated: Secondary | ICD-10-CM | POA: Diagnosis not present

## 2024-10-12 DIAGNOSIS — G4733 Obstructive sleep apnea (adult) (pediatric): Secondary | ICD-10-CM | POA: Diagnosis not present

## 2024-10-12 DIAGNOSIS — L121 Cicatricial pemphigoid: Secondary | ICD-10-CM | POA: Diagnosis not present

## 2024-10-12 NOTE — Patient Instructions (Signed)
°  VISIT SUMMARY: Today, we reviewed your interstitial lung disease and bronchiectasis, your use of CPAP for sleep apnea, and your autoimmune condition. We also discussed your upcoming knee replacement surgery.  YOUR PLAN: INTERSTITIAL LUNG DISEASE WITH BRONCHIECTASIS: You have inflammation and scarring in your lungs, which has slightly reduced your lung capacity. This may be related to your autoimmune condition. -Continue taking CellCept 500 mg twice daily. -We will do a follow-up CT scan in three months to check your lung condition. -We will coordinate with your rheumatologist to evaluate your elevated ANA levels.  OBSTRUCTIVE SLEEP APNEA: You are using CPAP therapy to manage your sleep apnea, which is working well for you. -Continue using CPAP every night. -Make sure to use your CPAP during your hospital stay for your upcoming knee surgery.                               Contains text generated by [employer]bridge.

## 2024-10-12 NOTE — Progress Notes (Addendum)
 "              Sarah Howell    [id number]    1941/04/15  Primary Care Physician:Lalonde, Norleen JAYSON, MD  Referring Physician: [name], MD [address] [place],  [place] [address]  Chief complaint: Follow-up for dyspnea, interstitial lung disease  HPI: 83 y.o. 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  Discussed the use of AI scribe software for clinical note transcription with the patient, who gave verbal consent to proceed.  History of Present Illness Sarah Howell is an 83 year old female with interstitial lung disease who presents with persistent cough and shortness of breath. She is accompanied by her husband.  She experiences persistent coughing and shortness of breath, particularly when walking short distances, which has been ongoing for many years. She was treated for acute bronchitis with two courses of antibiotics and cough medications, but symptoms persist. The shortness of breath has been worsening over time.  She has a history of ocular cicatricial pemphigoid, diagnosed at [facility], and has been on Cellcept for over two years, initially at a dose of 1 gram twice daily, now reduced to 500 mg twice daily due to improvement in her eye condition. Her vision has improved to 20/20 in one eye and 20/25 in the other.  She has a positive ANA, indicating an autoimmune process. She inquires if her lung issues could be related to her autoimmune condition.  No significant exposures such as smoking, pets, or occupational hazards that could contribute to her lung condition. She is on montelukast  for allergies, taken at bedtime, and denies a history of asthma, although she has family members with asthma.  She reports a diagnosis of mild sleep apnea from a 2019 sleep study, with an AHI of 9.1, but  has not been using her CPAP machine recently. She mentions using cough drops and Tessalon  Perles for her cough, but finds them ineffective.  Interim history: Sarah Howell is an 83 year old female with interstitial lung disease who presents for follow-up of her lung condition.  Interstitial lung disease and bronchiectasis - Interstitial lung disease with prior CT showing bronchiectasis, inflammation, and scarring - No chronic cough or congestion - Recent cough has resolved  Nocturnal cpap use and associated symptoms - Uses CPAP nightly for 6 to 8 hours - Occasionally wakes at night due to air leaking from the side of the mask - Associated facial redness with air leak  Autoimmune disease and immunosuppressive therapy - Ocular cicatricial pemphigoid treated with CellCept 500 mg twice daily - Mildly elevated ANA - Has not yet seen a rheumatologist but has an appointment scheduled  Musculoskeletal issues - Awaiting bilateral knee replacement surgery  Relevant pulmonary history Pets: Used to have a dog Occupation: Worked as an programmer, systems in school.  Currently retired Exposures: No mold, hot tub, Jacuzzi.  No feather pillow or comforter No h/o chemo/XRT/amiodarone/macrodantin/MTX  No exposure to asbestos, silica or other organic allergens  Smoking history: Never smoker Travel history: No significant travel history Relevant family history: No family history of lung disease  Outpatient Encounter Medications as of 10/12/2024  Medication Sig   albuterol  (VENTOLIN  HFA) 108 (90 Base) MCG/ACT inhaler Inhale 2 puffs into the lungs every 6 (six) hours as needed for wheezing or shortness of breath.   finasteride  (PROSCAR ) 5  MG tablet Take 0.5 tablets (2.5 mg total) by mouth daily. Patient uses for hair growth.   fluticasone  (FLONASE ) 50 MCG/ACT nasal spray PLACE 2 SPRAYS INTO THE NOSE DAILY.   losartan -hydrochlorothiazide (HYZAAR) 100-12.5 MG tablet Take 1 tablet by mouth daily.   montelukast   (SINGULAIR ) 10 MG tablet TAKE 1 TABLET BY MOUTH EVERYDAY AT BEDTIME   mycophenolate (CELLCEPT) 250 MG capsule Take 500 mg by mouth 2 (two) times daily.   pravastatin  (PRAVACHOL ) 80 MG tablet Take 1 tablet (80 mg total) by mouth daily.   No facility-administered encounter medications on file as of 10/12/2024.    Vitals:   10/12/24 0908 10/12/24 0946  BP: (!) 158/89 (!) 148/82  Pulse: 91   Temp: 97.9 F (36.6 C)   Height: 4' 11 (1.499 m)   Weight: 183 lb (83 kg)   SpO2: 95%   TempSrc: Oral   BMI (Calculated): 36.94     Physical Exam GEN: No acute distress. CV: Regular rate and rhythm, no murmurs. LUNGS: Clear to auscultation bilaterally, normal respiratory effort. SKIN JOINTS: Warm and dry, no rash.    Data Reviewed: Imaging: Chest x-ray 12/05/2011-probable chronic interstitial lung disease CT abdomen pelvis 01/06/2024-chronic interstitial changes, groundglass opacities, reticulation at the visualized lung base Chest x-ray 04/14/2024-bilateral chronic interstitial lung disease right greater than left I have reviewed the images personally.  PFTs:  Labs: Labs ([facility]) 05/21/2021 Positive: ANA (1:640 Speckled) -Negative: MPA, CMP, CBC, CRP, Pro3, lysozyme, RF, TB, CCP, RPR   CTD serologies 04/20/2024-ANA 1: 80 cytoplasmic Assessment & Plan Interstitial lung disease with bronchiectasis Mild elevation in ANA and CT scan findings suggest inflammation and scarring in the lungs. Moderate reduction in lung capacity due to scarring. Possible autoimmune etiology related to ocular cicatricial pemphigoid. Current treatment with CellCept may help reduce lung inflammation. No need to increase CellCept dose due to potential lung and infectious complications. Plan to monitor condition with follow-up imaging.  Biopsy not recommended due to advanced age. - Continue CellCept 500 mg twice daily.  Prescribed by her ophthalmologist. - Will order follow-up CT scan in three months to assess lung  condition. - She is due to see rheumatology for elevated ANA evaluation.  Ocular cicatricial pemphigoid Rare autoimmune condition affecting the eyes, diagnosed at [facility]. Managed with Cellcept, improving vision. Current dosage is 500 mg twice daily, reduced from 1 g twice daily due to improvement. Monitoring for vision changes to adjust dosage as needed. - Continue Cellcept 500 mg twice daily  Obstructive sleep apnea Managed with CPAP therapy. She uses CPAP consistently for 6-8 hours nightly. No chronic cough or congestion reported. Breathing is well-managed with CPAP use. - Continue CPAP therapy nightly. - Ensure CPAP use during hospital stay for upcoming knee surgery.  Peri-operative Assessment of Pulmonary Risk for Non-Thoracic Surgery:  Asked to provide pulmonary risk assessment for planned knee surgery  By ARISCAT criteria patient has   Low risk 1.6% risk of in-hospital post-op pulmonary complications (composite including respiratory failure, respiratory infection, pleural effusion, atelectasis, pneumothorax, bronchospasm, aspiration pneumonitis)  ForMs. Lewis, risk of perioperative pulmonary complications is increased by:  Age greater than 65 years  ILD  Obstructive sleep apnea    Respiratory complications generally occur in 1% of ASA Class I patients, 5% of ASA Class II and 10% of ASA Class III-IV patients These complications rarely result in mortality and iclude postoperative pneumonia, atelectasis, pulmonary embolism, ARDS and increased time requiring postoperative mechanical ventilation.  Overall, I recommend proceeding with the surgery if  the risk for respiratory complications are outweighed by the potential benefits. This will need to be discussed between the patient and surgeon.  To reduce risks of respiratory complications, I recommend:  --Pre- and post-operative incentive spirometry performed frequently while awake --Inpatient use of currently prescribed  positive-pressure for OSA whenever the patient is sleeping --Avoiding use of pancuronium during anesthesia.   Recommendations: Continue CellCept Follow-up CT Rheumatology evaluation CPAP Preop evaluation for knee surgery  Lonna Coder MD [facility] 10/12/2024, 9:20 AM  CC: Sarah Norleen BROCKS, MD    "

## 2024-10-24 NOTE — Telephone Encounter (Signed)
 Dr Theophilus, are you able to add risk assessment to your recent 10/12/24 ov note?

## 2024-11-08 NOTE — Telephone Encounter (Signed)
 I have added the risk assessment to the office note from 10/13/2019

## 2024-11-08 NOTE — Telephone Encounter (Signed)
 Dr. Jiles note from 10/12/24 faxed to [facility].

## 2024-11-11 ENCOUNTER — Telehealth: Payer: Self-pay

## 2024-11-11 NOTE — Telephone Encounter (Signed)
 Preoperative Clearance Form in brown folder, Has CPE on 11/16/24- Left Total knee Arthroplasty- Medical and Cardiac Clearance

## 2024-11-16 ENCOUNTER — Encounter: Payer: Self-pay | Admitting: Family Medicine

## 2024-11-16 ENCOUNTER — Ambulatory Visit: Payer: Medicare PPO | Admitting: Family Medicine

## 2024-11-16 VITALS — BP 146/82 | HR 86 | Ht 59.0 in | Wt 183.4 lb

## 2024-11-16 DIAGNOSIS — R7303 Prediabetes: Secondary | ICD-10-CM

## 2024-11-16 DIAGNOSIS — E7439 Other disorders of intestinal carbohydrate absorption: Secondary | ICD-10-CM

## 2024-11-16 DIAGNOSIS — J849 Interstitial pulmonary disease, unspecified: Secondary | ICD-10-CM | POA: Diagnosis not present

## 2024-11-16 DIAGNOSIS — M8580 Other specified disorders of bone density and structure, unspecified site: Secondary | ICD-10-CM

## 2024-11-16 DIAGNOSIS — G4733 Obstructive sleep apnea (adult) (pediatric): Secondary | ICD-10-CM | POA: Diagnosis not present

## 2024-11-16 DIAGNOSIS — I10 Essential (primary) hypertension: Secondary | ICD-10-CM | POA: Diagnosis not present

## 2024-11-16 DIAGNOSIS — H35072 Retinal telangiectasis, left eye: Secondary | ICD-10-CM | POA: Diagnosis not present

## 2024-11-16 DIAGNOSIS — Z853 Personal history of malignant neoplasm of breast: Secondary | ICD-10-CM

## 2024-11-16 DIAGNOSIS — Z23 Encounter for immunization: Secondary | ICD-10-CM

## 2024-11-16 DIAGNOSIS — H16143 Punctate keratitis, bilateral: Secondary | ICD-10-CM | POA: Diagnosis not present

## 2024-11-16 DIAGNOSIS — J309 Allergic rhinitis, unspecified: Secondary | ICD-10-CM

## 2024-11-16 DIAGNOSIS — J4530 Mild persistent asthma, uncomplicated: Secondary | ICD-10-CM

## 2024-11-16 DIAGNOSIS — Z0000 Encounter for general adult medical examination without abnormal findings: Secondary | ICD-10-CM | POA: Diagnosis not present

## 2024-11-16 DIAGNOSIS — E785 Hyperlipidemia, unspecified: Secondary | ICD-10-CM

## 2024-11-16 LAB — POCT UA - MICROALBUMIN
Albumin/Creatinine Ratio, Urine, POC: 29.3
Creatinine, POC: 161 mg/dL
Microalbumin Ur, POC: 47.1 mg/L

## 2024-11-16 LAB — POCT GLYCOSYLATED HEMOGLOBIN (HGB A1C): Hemoglobin A1C: 5.9 % — AB (ref 4.0–5.6)

## 2024-11-16 LAB — LIPID PANEL

## 2024-11-16 MED ORDER — ARNUITY ELLIPTA 50 MCG/ACT IN AEPB: 1.0000 | INHALATION_SPRAY | Freq: Every day | RESPIRATORY_TRACT | 3 refills | Status: AC

## 2024-11-16 MED ORDER — LOSARTAN POTASSIUM-HCTZ 100-12.5 MG PO TABS: 1.0000 | ORAL_TABLET | Freq: Every day | ORAL | 3 refills | Status: AC

## 2024-11-16 MED ORDER — PRAVASTATIN SODIUM 80 MG PO TABS: 80.0000 mg | ORAL_TABLET | Freq: Every day | ORAL | 3 refills | Status: AC

## 2024-11-16 MED ORDER — MONTELUKAST SODIUM 10 MG PO TABS: ORAL_TABLET | ORAL | 3 refills | Status: AC

## 2024-11-16 NOTE — Progress Notes (Signed)
 Sarah Howell is a 84 y.o. female who presents for annual wellness visit and follow-up on chronic medical conditions.  Discussed the use of AI scribe software for clinical note transcription with the patient, who gave verbal consent to proceed.  History of Present Illness   An 85 year old female with asthma and interstitial lung disease presents for a complete exam and medication review.  She is scheduled for total knee replacement.  She uses her albuterol  inhaler once or twice daily for shortness of breath, which provides some relief, but she continues to experience shortness of breath. She also uses a CPAP machine for sleep apnea, using it for seven hours nightly, though she sometimes feels tired despite this.  She usually gets good reports from the CPAP machine.z  Her current medications include Singulair  for allergies and asthma, Flonase  for nasal allergies, pravastatin  80 mg for cholesterol, and a blood pressure medication. She is concerned about the dosage of her cholesterol medication, stating that 80 mg seems high.  She has a history of interstitial lung disease with scarred lungs and is under the care of a pulmonary specialist who conducts regular breathing tests. She is scheduled to follow up with the pulmonary doctor in March.  She is up to date on most immunizations, including shingles, tetanus, flu, COVID, and RSV vaccines. She had a mammogram in July.  No recent falls or depression. Her family history includes diabetes on her father's side, although she is not diabetic but in a prediabetic state.      Immunizations and Health Maintenance Immunization History  Administered Date(s) Administered    sv, Bivalent, Protein Subunit Rsvpref,pf (Abrysvo) 10/23/2023   Fluad Quad(high Dose 65+) 07/08/2019, 07/03/2020, 07/09/2021, 07/07/2022   Fluad Trivalent(High Dose 65+) 07/01/2023   Hepatitis A, Adult 05/06/2023   INFLUENZA, HIGH DOSE SEASONAL PF 07/19/2014, 07/24/2015, 08/25/2016,  07/30/2017, 07/15/2018, 07/26/2024   Influenza Split 09/09/2012   Influenza Whole 10/03/2009   Influenza,inj,Quad PF,6+ Mos 07/21/2013   PFIZER Comirnaty (Gray Top)Covid-19 Tri-Sucrose Vaccine 02/18/2021   PFIZER(Purple Top)SARS-COV-2 Vaccination 11/11/2019, 11/30/2019, 07/03/2020   Pfizer Covid-19 Vaccine Bivalent Booster 40yrs & up 07/22/2021   Pfizer(Comirnaty )Fall Seasonal Vaccine 12 years and older 08/26/2022, 08/26/2023, 07/26/2024   Pneumococcal Conjugate-13 06/04/2017   Pneumococcal Polysaccharide-23 10/03/2009, 07/19/2014   Tdap 06/11/2016   Zoster Recombinant(Shingrix) 06/26/2017, 11/13/2017   Zoster, Live 10/28/2012   There are no preventive care reminders to display for this patient.  Last Pap smear: Last mammogram: 05/26/24 Last colonoscopy: 01/06/2018 Last DEXA:02/14/21 Dentist: Has upcoming appt, can't remember the name of dentist Ophtho: [facility] Exercise: Work around the home  Other doctors caring for patient include: pulmonologist-Praveen Mannam MD Dermatology-Melissa Mabe  Advanced directives: Does Patient Have a Medical Advance Directive?: No Would patient like information on creating a medical advance directive?: Yes (Inpatient - patient defers creating a medical advance directive at this time - Information given)  Depression screen:  See questionnaire below.     11/16/2024   10:13 AM 09/30/2023   10:37 AM 09/15/2022   10:31 AM 02/24/2022    3:29 PM 01/16/2021   10:16 AM  Depression screen PHQ 2/9  Decreased Interest 0 0 0 0 0  Down, Depressed, Hopeless 0 0 0 0 0  PHQ - 2 Score 0 0 0 0 0    Fall Risk Screen: see questionnaire below.    11/16/2024   10:13 AM 09/30/2023   10:37 AM 09/15/2022   10:31 AM 02/24/2022    3:29 PM 01/16/2021   10:16 AM  Fall Risk   Falls in the past year? 0 0 0 0 0  Number falls in past yr: 0 0 0 0 0  Injury with Fall? 0 0  0  0  0   Risk for fall due to : No Fall Risks  No Fall Risks No Fall Risks No Fall Risks  Follow  up Falls evaluation completed  Falls evaluation completed  Falls evaluation completed  Falls evaluation completed      Data saved with a previous flowsheet row definition    ADL screen:  See questionnaire below Functional Status Survey:     Review of Systems Constitutional: -, -unexpected weight change, -anorexia, -fatigue Allergy: -sneezing, -itching, -congestion Dermatology: denies changing moles, rash, lumps ENT: -runny nose, -ear pain, -sore throat,  Cardiology:  -chest pain, -palpitations, -orthopnea, Respiratory: -cough, -shortness of breath, -dyspnea on exertion, -wheezing,  Gastroenterology: -abdominal pain, -nausea, -vomiting, -diarrhea, -constipation, -dysphagia Hematology: -bleeding or bruising problems Musculoskeletal: -arthralgias, -myalgias, -joint swelling, -back pain, - Ophthalmology: -vision changes,  Urology: -dysuria, -difficulty urinating,  -urinary frequency, -urgency, incontinence Neurology: -, -numbness, , -memory loss, -falls, -dizziness    PHYSICAL EXAM:  BP (!) 146/82   Pulse 86   Ht 4' 11 (1.499 m)   Wt 183 lb 6.4 oz (83.2 kg)   SpO2 96%   BMI 37.04 kg/m   General Appearance: Alert, cooperative, no distress, appears stated age Head: Normocephalic, without obvious abnormality, atraumatic Eyes: PERRL, conjunctiva/corneas clear, EOM's intact, Ears: Normal TM's and external ear canals Nose: Nares normal, mucosa normal, no drainage or sinus tenderness Throat: Lips, mucosa, and tongue normal; teeth and gums normal Neck: Supple, no lymphadenopathy;  thyroid:  no enlargement/tenderness/nodules; no carotid bruit or JVD Lungs: Clear to auscultation bilaterally without wheezes, rales or ronchi; respirations unlabored Heart: Regular rate and rhythm, S1 and S2 normal, no murmur, rubor gallop Abdomen: Soft, non-tender, nondistended, normoactive bowel sounds,  no masses, no hepatosplenomegaly Skin:  Skin color, texture, turgor normal, no rashes or  lesions Lymph nodes: Cervical, supraclavicular, and axillary nodes normal Neurologic:  CNII-XII intact, normal strength, sensation and gait; reflexes 2+ and symmetric throughout Psych: Normal mood, affect, hygiene and grooming. Hemoglobin A1c is 5.9 ASSESSMENT/PLAN: Assessment and Plan    Immunizations and preventive care Immunizations up to date except pneumonia vaccine. RSV vaccine confirmed. Mammogram scheduled for July. No Pap smear needed. - Administered pneumonia vaccine. - Ordered DEXA scan. - Ordered blood work.  Mild persistent asthma Asthma requires more aggressive treatment due to frequent albuterol  use. - Prescribed Arnuity - Discontinued albuterol  if new inhaler effective.  Obstructive sleep apnea Managed with CPAP, effective use reported. - Continue CPAP use.  Interstitial lung disease Scarred lungs require pulmonary follow-up. - Coordinated with pulmonary specialist.  Primary hypertension - Continue current antihypertensive medication.  Hyperlipidemia Managed with pravastatin , dosage appropriate. - Continue pravastatin . - Ordered blood work for cholesterol monitoring.  Osteopenia Requires monitoring, DEXA scan planned. - Ordered DEXA scan.  Allergic rhinitis Managed with Singulair  and Flonase . - Continue Singulair  and Flonase .         Immunization recommendations discussed.   Medicare Attestation I have personally reviewed: The patient's medical and social history Their use of alcohol, tobacco or illicit drugs Their current medications and supplements The patient's functional ability including ADLs,fall risks, home safety risks, cognitive, and hearing and visual impairment Diet and physical activities Evidence for depression or mood disorders  The patient's weight, height, and BMI have been recorded in the chart.  I have made referrals,  counseling, and provided education to the patient based on review of the above and I have provided the patient  with a written personalized care plan for preventive services.     Norleen Jobs, MD   11/16/2024

## 2024-11-17 ENCOUNTER — Ambulatory Visit: Payer: Self-pay | Admitting: Family Medicine

## 2024-11-17 LAB — CBC WITH DIFFERENTIAL/PLATELET
Basophils Absolute: 0 x10E3/uL (ref 0.0–0.2)
Basos: 1 %
EOS (ABSOLUTE): 0.2 x10E3/uL (ref 0.0–0.4)
Eos: 2 %
Hematocrit: 42.2 % (ref 34.0–46.6)
Hemoglobin: 13.7 g/dL (ref 11.1–15.9)
Immature Grans (Abs): 0 x10E3/uL (ref 0.0–0.1)
Immature Granulocytes: 0 %
Lymphocytes Absolute: 2 x10E3/uL (ref 0.7–3.1)
Lymphs: 26 %
MCH: 30.8 pg (ref 26.6–33.0)
MCHC: 32.5 g/dL (ref 31.5–35.7)
MCV: 95 fL (ref 79–97)
Monocytes Absolute: 0.7 x10E3/uL (ref 0.1–0.9)
Monocytes: 9 %
Neutrophils Absolute: 4.9 x10E3/uL (ref 1.4–7.0)
Neutrophils: 62 %
Platelets: 245 x10E3/uL (ref 150–450)
RBC: 4.45 x10E6/uL (ref 3.77–5.28)
RDW: 13.7 % (ref 11.7–15.4)
WBC: 7.9 x10E3/uL (ref 3.4–10.8)

## 2024-11-17 LAB — COMPREHENSIVE METABOLIC PANEL WITH GFR
ALT: 9 IU/L (ref 0–32)
AST: 21 IU/L (ref 0–40)
Albumin: 4.6 g/dL (ref 3.7–4.7)
Alkaline Phosphatase: 80 IU/L (ref 48–129)
BUN/Creatinine Ratio: 16 (ref 12–28)
BUN: 16 mg/dL (ref 8–27)
Bilirubin Total: 0.6 mg/dL (ref 0.0–1.2)
CO2: 16 mmol/L — AB (ref 20–29)
Calcium: 9.8 mg/dL (ref 8.7–10.3)
Chloride: 105 mmol/L (ref 96–106)
Creatinine, Ser: 0.99 mg/dL (ref 0.57–1.00)
Globulin, Total: 3.2 g/dL (ref 1.5–4.5)
Glucose: 116 mg/dL — AB (ref 70–99)
Potassium: 4.1 mmol/L (ref 3.5–5.2)
Sodium: 139 mmol/L (ref 134–144)
Total Protein: 7.8 g/dL (ref 6.0–8.5)
eGFR: 56 mL/min/1.73 — AB

## 2024-11-17 LAB — LIPID PANEL
Cholesterol, Total: 243 mg/dL — AB (ref 100–199)
HDL: 53 mg/dL
LDL CALC COMMENT:: 4.6 ratio — AB (ref 0.0–4.4)
LDL Chol Calc (NIH): 165 mg/dL — AB (ref 0–99)
Triglycerides: 138 mg/dL (ref 0–149)
VLDL Cholesterol Cal: 25 mg/dL (ref 5–40)

## 2024-11-18 ENCOUNTER — Other Ambulatory Visit

## 2024-11-18 DIAGNOSIS — Z23 Encounter for immunization: Secondary | ICD-10-CM

## 2024-11-23 ENCOUNTER — Other Ambulatory Visit

## 2024-11-30 ENCOUNTER — Ambulatory Visit (HOSPITAL_BASED_OUTPATIENT_CLINIC_OR_DEPARTMENT_OTHER): Admitting: Pulmonary Disease

## 2024-12-20 ENCOUNTER — Ambulatory Visit (HOSPITAL_BASED_OUTPATIENT_CLINIC_OR_DEPARTMENT_OTHER): Admitting: Pulmonary Disease

## 2024-12-22 ENCOUNTER — Encounter: Admitting: Rheumatology

## 2025-01-10 ENCOUNTER — Other Ambulatory Visit

## 2025-01-12 ENCOUNTER — Encounter

## 2025-01-12 ENCOUNTER — Ambulatory Visit: Admitting: Pulmonary Disease

## 2025-01-19 ENCOUNTER — Ambulatory Visit: Admitting: Rheumatology

## 2025-11-21 ENCOUNTER — Encounter: Admitting: Family Medicine
